# Patient Record
Sex: Female | Born: 1950 | Race: Black or African American | Hispanic: No | Smoking: Former smoker
Health system: Southern US, Community
[De-identification: ages and names within clinical notes are randomized; demographics above are authoritative.]

## PROBLEM LIST (undated history)

## (undated) DIAGNOSIS — C801 Malignant (primary) neoplasm, unspecified: Secondary | ICD-10-CM

## (undated) DIAGNOSIS — I10 Essential (primary) hypertension: Secondary | ICD-10-CM

## (undated) DIAGNOSIS — Z720 Tobacco use: Secondary | ICD-10-CM

## (undated) DIAGNOSIS — C7931 Secondary malignant neoplasm of brain: Secondary | ICD-10-CM

## (undated) DIAGNOSIS — C7900 Secondary malignant neoplasm of unspecified kidney and renal pelvis: Secondary | ICD-10-CM

## (undated) HISTORY — DX: Tobacco use: Z72.0

## (undated) HISTORY — DX: Essential (primary) hypertension: I10

---

## 1999-08-04 ENCOUNTER — Other Ambulatory Visit: Admission: RE | Admit: 1999-08-04 | Discharge: 1999-08-04 | Payer: Self-pay | Admitting: Family Medicine

## 1999-09-06 ENCOUNTER — Encounter: Payer: Self-pay | Admitting: Family Medicine

## 1999-09-06 ENCOUNTER — Ambulatory Visit (HOSPITAL_COMMUNITY): Admission: RE | Admit: 1999-09-06 | Discharge: 1999-09-06 | Payer: Self-pay | Admitting: Family Medicine

## 2000-04-12 ENCOUNTER — Emergency Department (HOSPITAL_COMMUNITY): Admission: EM | Admit: 2000-04-12 | Discharge: 2000-04-12 | Payer: Self-pay | Admitting: Emergency Medicine

## 2000-07-16 ENCOUNTER — Other Ambulatory Visit: Admission: RE | Admit: 2000-07-16 | Discharge: 2000-07-16 | Payer: Self-pay | Admitting: Family Medicine

## 2000-08-07 ENCOUNTER — Encounter: Payer: Self-pay | Admitting: Family Medicine

## 2000-08-07 ENCOUNTER — Ambulatory Visit (HOSPITAL_COMMUNITY): Admission: RE | Admit: 2000-08-07 | Discharge: 2000-08-07 | Payer: Self-pay | Admitting: Family Medicine

## 2001-03-12 ENCOUNTER — Encounter: Admission: RE | Admit: 2001-03-12 | Discharge: 2001-03-12 | Payer: Self-pay

## 2002-03-03 ENCOUNTER — Encounter: Payer: Self-pay | Admitting: Family Medicine

## 2002-03-03 ENCOUNTER — Ambulatory Visit (HOSPITAL_COMMUNITY): Admission: RE | Admit: 2002-03-03 | Discharge: 2002-03-03 | Payer: Self-pay | Admitting: Family Medicine

## 2003-05-13 ENCOUNTER — Emergency Department (HOSPITAL_COMMUNITY): Admission: EM | Admit: 2003-05-13 | Discharge: 2003-05-13 | Payer: Self-pay | Admitting: *Deleted

## 2003-05-14 ENCOUNTER — Other Ambulatory Visit (HOSPITAL_COMMUNITY): Admission: RE | Admit: 2003-05-14 | Discharge: 2003-05-31 | Payer: Self-pay | Admitting: Psychiatry

## 2003-07-19 ENCOUNTER — Encounter: Payer: Self-pay | Admitting: Emergency Medicine

## 2003-07-19 ENCOUNTER — Emergency Department (HOSPITAL_COMMUNITY): Admission: EM | Admit: 2003-07-19 | Discharge: 2003-07-19 | Payer: Self-pay | Admitting: Emergency Medicine

## 2009-01-28 ENCOUNTER — Ambulatory Visit (HOSPITAL_COMMUNITY): Admission: RE | Admit: 2009-01-28 | Discharge: 2009-01-28 | Payer: Self-pay | Admitting: Internal Medicine

## 2011-08-02 ENCOUNTER — Other Ambulatory Visit (HOSPITAL_COMMUNITY): Payer: Self-pay | Admitting: Internal Medicine

## 2011-08-02 DIAGNOSIS — Z1231 Encounter for screening mammogram for malignant neoplasm of breast: Secondary | ICD-10-CM

## 2011-08-15 ENCOUNTER — Ambulatory Visit (HOSPITAL_COMMUNITY)
Admission: RE | Admit: 2011-08-15 | Discharge: 2011-08-15 | Disposition: A | Discharge status: Home / Self Care | Payer: Self-pay | Source: Ambulatory Visit | Attending: Internal Medicine | Admitting: Internal Medicine

## 2011-08-15 DIAGNOSIS — Z1231 Encounter for screening mammogram for malignant neoplasm of breast: Secondary | ICD-10-CM | POA: Insufficient documentation

## 2011-08-17 ENCOUNTER — Other Ambulatory Visit: Payer: Self-pay | Admitting: Internal Medicine

## 2011-08-17 DIAGNOSIS — R928 Other abnormal and inconclusive findings on diagnostic imaging of breast: Secondary | ICD-10-CM

## 2011-09-04 ENCOUNTER — Other Ambulatory Visit: Payer: Self-pay

## 2011-09-06 ENCOUNTER — Ambulatory Visit
Admission: RE | Admit: 2011-09-06 | Discharge: 2011-09-06 | Disposition: A | Discharge status: Home / Self Care | Payer: Private Health Insurance - Indemnity | Source: Ambulatory Visit | Attending: Internal Medicine | Admitting: Internal Medicine

## 2011-09-06 DIAGNOSIS — R928 Other abnormal and inconclusive findings on diagnostic imaging of breast: Secondary | ICD-10-CM

## 2014-03-23 ENCOUNTER — Encounter: Payer: Self-pay | Admitting: Family Medicine

## 2014-03-23 ENCOUNTER — Ambulatory Visit (INDEPENDENT_AMBULATORY_CARE_PROVIDER_SITE_OTHER): Payer: 59 | Admitting: Medical

## 2014-03-23 ENCOUNTER — Encounter: Payer: Self-pay | Admitting: Medical

## 2014-03-23 VITALS — BP 130/80 | HR 88 | Temp 98.2°F | Resp 14 | Wt 133.0 lb

## 2014-03-23 DIAGNOSIS — I10 Essential (primary) hypertension: Secondary | ICD-10-CM

## 2014-03-23 DIAGNOSIS — R51 Headache: Secondary | ICD-10-CM

## 2014-03-23 DIAGNOSIS — F430 Acute stress reaction: Secondary | ICD-10-CM

## 2014-03-23 DIAGNOSIS — F17200 Nicotine dependence, unspecified, uncomplicated: Secondary | ICD-10-CM

## 2014-03-23 NOTE — Patient Instructions (Signed)
  Thank you for giving me the opportunity to serve you today.    Your diagnosis today includes: Encounter Diagnoses  Name Primary?  . Headache(784.0) Yes  . Essential hypertension, benign   . Tobacco use disorder   . Acute stress reaction      Specific recommendations today include:  Blood pressure looks fine today, continue your current medication  You may take Tylenol or Excedrin over-the-counter for the next few days for headache  You may use Benadryl for Zquil over-the-counter for sleep if needed  Try to make peace with your boss and fellow employees to help keep a peaceful work environment  If needed, go to your boss' supervisor to document the inappropriate name calling and concerns  Recheck if needed on this or if headache and symptoms worsen  Otherwise, recheck in 3 months on blood pressure

## 2014-03-23 NOTE — Progress Notes (Signed)
Subjective:   Beth Alexander is a 63 y.o. female presenting on 03/23/2014 with Headache and ITCHING ON HER ARMS  New patient today.  Was seeing [facility] prior, recently got notice from insurer that her current provider of 5 years no longer takes [facility], thus, having to change providers.    She is here today for headache and concern that her blood pressure is elevated.  She has a history of high blood pressure, currently takes amlodipine 5 mg daily.  She normally does not check her blood pressures, normally doesn't have problems the headaches or stress.  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 Works at the [employer].  She says her boss has a split personality, some days can be happy and easy With, other days not so much.  On Sunday her boss is complained about the front office, and she ended up having a conversation with the front office people that got back to her boss.  Her boss called her a "fucking idiot."  This really upset her. Since then she has had a headache, had trouble sleeping, had a rough day at work yesterday because a headache.  She currently reports a frontal headache, rating it 9/10 for pain. She has been concerned that it was caused by high blood pressure since she felt really upset about the incident with her manager.  Denies vision changes, no swelling, no dizziness, no lightheadedness, no numbness or no med.  She does report tingling in her fingers for the last year, but no new tingling weakness or numbness.  She has had no change in her caffeine intake, she does drink 2 cups of coffee a day, smokes a pack a day.  There is no history of brain tumor aneurysm or headache issues with family.  No other aggravating or relieving factors.  No other complaint.  Review of Systems ROS as in subjective      Objective:    Filed Vitals:   03/23/14 1101  BP: 130/80  Pulse: 88  Temp: 98.2 F (36.8 C)  Resp: 14    General appearance:  alert, no distress, WD/WN, AA female, strong tobacco odor HEENT: normocephalic, sclerae anicteric, TMs pearly, nares patent, no discharge or erythema, pharynx normal Oral cavity: MMM, no lesions Neck: supple, no lymphadenopathy, no thyromegaly, no masses, no bruits Heart: RRR, normal S1, S2, no murmurs Lungs: CTA bilaterally, no wheezes, rhonchi, or rales Pulses: 2+ symmetric, upper and lower extremities, normal cap refill Ext: no edema Neuro: CN2-12 intact, A&Ox3, nonfocal exam      Assessment: Encounter Diagnoses  Name Primary?  . Headache(784.0) Yes  . Acute stress reaction   . Essential hypertension, benign   . Tobacco use disorder      Plan: We discussed her concerns, counseled on her stress at work.   Advised she c/t same medication, and specific recommendations today include:  Blood pressure looks fine today, continue your current medication  You may take Tylenol or Excedrin over-the-counter for the next few days for headache  You may use Benadryl for Zquil over-the-counter for sleep if needed  Try to make peace with your boss and fellow employees to help keep a peaceful work environment  If needed, go to your boss' supervisor to document the inappropriate name calling and concerns  Recheck if needed on this or if headache and symptoms worsen  Otherwise, recheck in 3 months on blood pressure  We requested prior PCM records today.  [name] was seen today for headache and itching on her arms.  Diagnoses and associated orders for this visit:  Headache(784.0)  Acute stress reaction  Essential hypertension, benign  Tobacco use disorder     Return in about 3 months (around 06/23/2014).

## 2014-04-07 ENCOUNTER — Telehealth: Payer: Self-pay | Admitting: Family Medicine

## 2014-04-07 NOTE — Telephone Encounter (Signed)
I LMOM ON HER VOICEMAIL. CLS

## 2014-04-07 NOTE — Telephone Encounter (Signed)
I don't believe I looked at her armpits at her last visit.  This sounds like it could be allergic reaction to deodorant.   I either need to see her, or have her stop current deodorant, use OTC Cortaid topically BID, and can use benadryl oral OTC for itching.

## 2014-04-07 NOTE — Telephone Encounter (Signed)
Returned call to pt, she states she can't get itching under control on her arms. She has tried benedryl cream, cortisone cream and calimine.  Pt is req rx to be called into [facility] [address]  Pt saw you 03/23/14 regarding this also. Pt ph [phone]

## 2014-04-07 NOTE — Telephone Encounter (Signed)
LMOM TO CB. CLS 

## 2014-04-09 ENCOUNTER — Other Ambulatory Visit: Payer: Self-pay | Admitting: Medical

## 2014-04-09 ENCOUNTER — Telehealth: Payer: Self-pay | Admitting: Family Medicine

## 2014-04-09 ENCOUNTER — Encounter: Payer: Self-pay | Admitting: Medical

## 2014-04-09 ENCOUNTER — Ambulatory Visit (INDEPENDENT_AMBULATORY_CARE_PROVIDER_SITE_OTHER): Payer: 59 | Admitting: Medical

## 2014-04-09 VITALS — BP 130/80 | HR 82 | Temp 98.6°F | Resp 16 | Wt 135.0 lb

## 2014-04-09 DIAGNOSIS — L259 Unspecified contact dermatitis, unspecified cause: Secondary | ICD-10-CM

## 2014-04-09 MED ORDER — TRIAMCINOLONE ACETONIDE 0.1 % EX CREA: 1.0000 "application " | TOPICAL_CREAM | Freq: Two times a day (BID) | CUTANEOUS | Status: DC

## 2014-04-09 MED ORDER — CLOBETASOL PROPIONATE 0.05 % EX CREA: 1.0000 "application " | TOPICAL_CREAM | Freq: Two times a day (BID) | CUTANEOUS | Status: DC

## 2014-04-09 NOTE — Telephone Encounter (Signed)
Dorothea Ogle PAC called out another Rx to the pharmacy. CLS

## 2014-04-09 NOTE — Patient Instructions (Signed)

## 2014-04-09 NOTE — Progress Notes (Signed)
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 Rash started 1 month ago. Lesions are red, irritated, and papular in texture. Rash has progressively worsened. Rash is pruritic and tingles, feels pins and needles. Associated symptoms: none. Patient denies: abdominal pain, arthralgia, cough, decrease in appetite, decrease in energy level, fever, headache, nausea, sore throat and vomiting. Patient has not had contacts with similar rash. Patient has not had new exposures - no new soaps, cleaning agents (works in housekeeping).  she normally doesn't use gloves at work, even when cleaning toilets.  Has not been working in the yard or come into contact with poison ivy.  Of note, she has been out in the sun a lot in the past month, taking the bus to work, admitting that her arms are exposed and she regularly sweats over the area causing a stinging like sensation. She has tried Calamine lotion, Benadryl lotion, some hydrocortisone cream without any relief. No other aggravating or relieving factors.  The following portions of the patient's history were reviewed and updated as appropriate: allergies, current medications, past family history, past medical history, past social history and problem list.  Review of Systems As in subjective above   Objective:   Gen: wd, wn, nad Skin: Skin at antecubital regions bilat, somewhat on forearms also with dry, rough, slightly raised broadly, slightly erythematous and brown skin, well demarcated, few small 82mm papules between fingers of both hands with slight erythema.  No warmth, induration, fluctuance.  No discharge.   Assessment:      Encounter Diagnosis  Name Primary?  . Contact dermatitis Yes     Plan:   Discussed symptoms and exam findings.  Etiology most likely contact dermatitis.  Begin Clobetasol cream topically short term x up to 1 week.  Use good  hygiene.  Use gloves at work [employer] cabin [employer], chemicals, etc.   Can use Benadryl oral the next few days.   Call if not resolved within a week.

## 2014-04-09 NOTE — Telephone Encounter (Signed)
pls call pharmacy to get cheaper options from pharmacist, but similar steroid cream strength.

## 2014-04-09 NOTE — Telephone Encounter (Signed)
Patient called and said the Rx cream is costing her $100.00 dollars is there anything else that is cheaper?

## 2014-04-13 ENCOUNTER — Telehealth: Payer: Self-pay | Admitting: Medical

## 2014-04-13 NOTE — Telephone Encounter (Signed)
Pt called and stated that the medication she was given for itch is going to cost her $100. She can not afford that. She is requesting something less expensive be called in she would like it sent to a DIFFERENT PHARMACY. New pharmacy is [facility] [address]. Please call pt with update. Pt can be reached at [phone]

## 2014-04-13 NOTE — Telephone Encounter (Signed)
pls call and inquire.  Triamcinolone cream shouldn't cost very much.  What is the pharmacist option instead?

## 2014-04-15 ENCOUNTER — Telehealth: Payer: Self-pay | Admitting: Medical

## 2014-04-15 NOTE — Telephone Encounter (Signed)
Pt called back on the 23rd and stated that the medication she was prescribed was too expensive. She states she has called back a few times after that and still hasn't heard anything. Please call back pt and let her know what is going on.

## 2014-04-15 NOTE — Telephone Encounter (Signed)
Called [facility] and they have the Triamcinolone Cream for $4.06 called pt home and cell left message.  This is much cheaper

## 2014-04-19 NOTE — Telephone Encounter (Signed)
Pt called in and spoke with Mickel Baas and was advised of cheaper cream

## 2014-04-26 ENCOUNTER — Encounter: Payer: Self-pay | Admitting: Medical

## 2014-06-30 ENCOUNTER — Other Ambulatory Visit: Payer: Self-pay | Admitting: Family Medicine

## 2014-06-30 ENCOUNTER — Telehealth: Payer: Self-pay | Admitting: Medical

## 2014-06-30 MED ORDER — AMLODIPINE BESYLATE 5 MG PO TABS: 5.0000 mg | ORAL_TABLET | Freq: Every day | ORAL | Status: DC

## 2014-06-30 NOTE — Telephone Encounter (Signed)
Rx refills sent on BP medication

## 2016-07-09 ENCOUNTER — Ambulatory Visit: Payer: Self-pay | Admitting: Allergy and Immunology

## 2016-08-07 ENCOUNTER — Other Ambulatory Visit: Payer: Self-pay | Admitting: Internal Medicine

## 2016-08-07 DIAGNOSIS — Z1231 Encounter for screening mammogram for malignant neoplasm of breast: Secondary | ICD-10-CM

## 2016-08-07 DIAGNOSIS — E2839 Other primary ovarian failure: Secondary | ICD-10-CM

## 2016-08-15 ENCOUNTER — Ambulatory Visit
Admission: RE | Admit: 2016-08-15 | Discharge: 2016-08-15 | Disposition: A | Discharge status: Home / Self Care | Payer: BLUE CROSS/BLUE SHIELD | Source: Ambulatory Visit | Attending: Internal Medicine | Admitting: Internal Medicine

## 2016-08-15 DIAGNOSIS — Z1231 Encounter for screening mammogram for malignant neoplasm of breast: Secondary | ICD-10-CM

## 2016-08-17 ENCOUNTER — Other Ambulatory Visit: Payer: Self-pay | Admitting: Internal Medicine

## 2016-08-17 DIAGNOSIS — R928 Other abnormal and inconclusive findings on diagnostic imaging of breast: Secondary | ICD-10-CM

## 2016-08-29 ENCOUNTER — Other Ambulatory Visit: Payer: Self-pay | Admitting: Internal Medicine

## 2016-08-29 ENCOUNTER — Ambulatory Visit
Admission: RE | Admit: 2016-08-29 | Discharge: 2016-08-29 | Disposition: A | Discharge status: Home / Self Care | Payer: BLUE CROSS/BLUE SHIELD | Source: Ambulatory Visit | Attending: Internal Medicine | Admitting: Internal Medicine

## 2016-08-29 DIAGNOSIS — R928 Other abnormal and inconclusive findings on diagnostic imaging of breast: Secondary | ICD-10-CM

## 2016-08-29 DIAGNOSIS — R921 Mammographic calcification found on diagnostic imaging of breast: Secondary | ICD-10-CM

## 2016-08-29 DIAGNOSIS — E2839 Other primary ovarian failure: Secondary | ICD-10-CM

## 2016-09-04 ENCOUNTER — Ambulatory Visit
Admission: RE | Admit: 2016-09-04 | Discharge: 2016-09-04 | Disposition: A | Discharge status: Home / Self Care | Payer: BLUE CROSS/BLUE SHIELD | Source: Ambulatory Visit | Attending: Internal Medicine | Admitting: Internal Medicine

## 2016-09-04 DIAGNOSIS — R921 Mammographic calcification found on diagnostic imaging of breast: Secondary | ICD-10-CM

## 2016-09-04 DIAGNOSIS — R928 Other abnormal and inconclusive findings on diagnostic imaging of breast: Secondary | ICD-10-CM

## 2016-09-17 ENCOUNTER — Ambulatory Visit: Payer: Self-pay | Admitting: Surgery

## 2016-09-17 DIAGNOSIS — N6092 Unspecified benign mammary dysplasia of left breast: Secondary | ICD-10-CM

## 2016-09-17 NOTE — H&P (Signed)
History of Present Illness Beth Alexander. Beth Tadros MD; 09/17/2016 12:01 PM) Patient words: ADH.  The patient is a 65 year old female who presents with a complaint of Breast problems. Referred by Dr. Jeanie Alexander for atypical ductal hyperplasia left breast  This is a 65 year old female who recently underwent routine screening mammogram. Her last mammogram was about 4 years prior. This showed some suspicious findings in the lower outer quadrant of her left breast. She underwent further workup including diagnostic mammogram which showed some pleomorphic calcifications in the lower outer quadrant left breast banding an area 4.7 cm. There is no suspicious mass. She underwent a left stereotactic core needle biopsy Alexander 09/04/14. A clip was placed at the time of biopsy. 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 She presents now for excision.  Menarche age 71 First pregnancy age 56 Breast-feed none Hormones none Menopause age 52 Family history had breast cancer at age 32  CLINICAL DATA: Screening.  EXAM: DIGITAL SCREENING BILATERAL MAMMOGRAM WITH CAD  COMPARISON: Previous exam(s).  ACR Breast Density Category b: There are scattered areas of fibroglandular density.  FINDINGS: In the left breast, calcifications warrant further evaluation. In the right breast, no findings suspicious for malignancy. Images were processed with CAD.  IMPRESSION: Further evaluation is suggested for calcifications in the left breast.  RECOMMENDATION: Diagnostic mammogram of the left breast. (Code:[id number])  The patient will be contacted regarding the findings, and additional imaging will be scheduled.  BI-RADS CATEGORY 0: Incomplete. Need additional imaging evaluation and/or prior mammograms for comparison.   Electronically Signed By: Beth Alexander M.D. Alexander: 08/16/2016 11:41  CLINICAL DATA: Patient was called back from screening mammogram for left breast  calcifications.  EXAM: DIGITAL DIAGNOSTIC LEFT MAMMOGRAM WITH CAD  COMPARISON: Previous exam(s).  ACR Breast Density Category b: There are scattered areas of fibroglandular density.  FINDINGS: Additional imaging of the left breast was performed. In the lower outer quadrant are calcifications spanning an area of 4.7 cm. They are pleomorphic and in a segmental distribution. They are concerning. There is no suspicious mass.  Mammographic images were processed with CAD.  IMPRESSION: Suspicious left breast calcifications.  RECOMMENDATION: Stereotactic biopsy of the left breast calcifications is recommended. The biopsy will be scheduled at the patient's convenience.  I have discussed the findings and recommendations with the patient. Results were also provided in writing at the conclusion of the visit. If applicable, a reminder letter will be sent to the patient regarding the next appointment.  BI-RADS CATEGORY 5: Highly suggestive of malignancy.   Electronically Signed By: Beth Alexander: 08/29/2016 14:05  CLINICAL DATA: Patient with left breast calcifications presents today for stereotactic biopsy.  EXAM: LEFT BREAST STEREOTACTIC CORE NEEDLE BIOPSY  COMPARISON: Previous exams.  FINDINGS: The patient and I discussed the procedure of stereotactic-guided biopsy including benefits and alternatives. We discussed the high likelihood of a successful procedure. We discussed the risks of the procedure including infection, bleeding, tissue injury, clip migration, and inadequate sampling. Informed written consent was given. The usual time out protocol was performed immediately prior to the procedure.  Using sterile technique and 1% Lidocaine as local anesthetic, under stereotactic guidance, a 9 gauge vacuum assisted device was used to perform core needle biopsy of calcifications in the lower outer quadrant of the left breast using a lateral approach.  Specimen radiograph was performed showing calcifications. Specimens with calcifications are identified for pathology.  At the conclusion of the procedure, a coil shaped tissue marker clip was deployed into the biopsy cavity. Follow-up  2-view mammogram was performed and dictated separately.  IMPRESSION: Stereotactic-guided biopsy of left breast calcifications. No apparent complications.  Electronically Signed: By: Beth Alexander M.D. Alexander: 09/04/2016 11:20  ADDENDUM: Pathology revealed fibrocystic changes with atypical ductal hyperplasia and calcifications in the left breast. This was found to be concordant by Dr. Franki Alexander. Pathology results were discussed with the patient by telephone. The patient reported doing well after the biopsy with tenderness at the site. Post biopsy instructions and care were reviewed and questions were answered. The patient was encouraged to call [facility] for any additional concerns. Surgical consultation has been arranged with Dr. Donnie Alexander at [facility] September 17, 2016.  There are similar appearing left breast calcifications, both anterior and posterior to the biopsy site spanning 4.7 cm, and would consider bracketing of the calcifications at the time of surgery.  Pathology results reported by Beth Alexander Alexander 09/05/2016.   Electronically Signed By: Beth Alexander M.D. Alexander: 09/05/2016 10:20   Other Problems (Beth Eversole, LPN; 85/46/2703 50:09 AM) High blood pressure  Past Surgical History (Beth Eversole, LPN; 38/18/2993 71:69 AM) Breast Biopsy Left.  Diagnostic Studies History (Beth Eversole, LPN; 67/89/3810 17:51 AM) Colonoscopy never Mammogram within last year Pap Smear 1-5 years ago  Allergies (Beth Eversole, LPN; 02/58/5277 82:42 AM) No Known Drug Allergies 09/17/2016  Medication History (Beth Eversole, LPN; 35/36/1443 15:40 AM) Aspirin ('325MG'$   Tablet, Oral) Active. Norvasc ('5MG'$  Tablet, Oral) Active. Medications Reconciled  Social History (Beth Eversole, LPN; 08/67/6195 09:32 AM) Alcohol use Recently quit alcohol use. Caffeine use Coffee. No drug use Tobacco use Current some day smoker.  Family History (Beth Borer, LPN; 67/09/4579 99:83 AM) Breast Cancer Mother. Colon Cancer Brother. Heart Disease Father.  Pregnancy / Birth History Beth Borer, LPN; 38/25/0539 76:73 AM) Age at menarche 10 years. Age of menopause <46 Contraceptive History Oral contraceptives. Gravida 2 Maternal age 68-20 Para 2 Regular periods     Review of Systems (Beth Eversole LPN; 41/93/7902 40:97 AM) General Not Present- Appetite Loss, Chills, Fatigue, Fever, Night Sweats, Weight Gain and Weight Loss. HEENT Not Present- Earache, Hearing Loss, Hoarseness, Nose Bleed, Oral Ulcers, Ringing in the Ears, Seasonal Allergies, Sinus Pain, Sore Throat, Visual Disturbances, Wears glasses/contact lenses and Yellow Eyes. Respiratory Present- Snoring. Not Present- Bloody sputum, Chronic Cough, Difficulty Breathing and Wheezing. Breast Not Present- Breast Mass, Breast Pain, Nipple Discharge and Skin Changes. Cardiovascular Not Present- Chest Pain, Difficulty Breathing Lying Down, Leg Cramps, Palpitations, Rapid Heart Rate, Shortness of Breath and Swelling of Extremities. Gastrointestinal Not Present- Abdominal Pain, Bloating, Bloody Stool, Change in Bowel Habits, Chronic diarrhea, Constipation, Difficulty Swallowing, Excessive gas, Gets full quickly at meals, Hemorrhoids, Indigestion, Nausea, Rectal Pain and Vomiting. Female Genitourinary Not Present- Frequency, Nocturia, Painful Urination, Pelvic Pain and Urgency. Musculoskeletal Not Present- Back Pain, Joint Pain, Joint Stiffness, Muscle Pain, Muscle Weakness and Swelling of Extremities. Neurological Not Present- Decreased Memory, Fainting, Headaches, Numbness, Seizures, Tingling,  Tremor, Trouble walking and Weakness. Psychiatric Not Present- Anxiety, Bipolar, Change in Sleep Pattern, Depression, Fearful and Frequent crying. Endocrine Not Present- Cold Intolerance, Excessive Hunger, Hair Changes, Heat Intolerance, Hot flashes and New Diabetes. Hematology Not Present- Blood Thinners, Easy Bruising, Excessive bleeding, Gland problems, HIV and Persistent Infections.  Vitals (Beth Eversole LPN; 35/32/9924 26:83 AM) 09/17/2016 11:01 AM Weight: 146.8 lb Height: 62in Body Surface Area: 1.68 m Body Mass Index: 26.85 kg/m  Temp.: 98.33F(Oral)  Pulse: 84 (Regular)  BP: 130/70 (Sitting, Left Arm, Standard)  Physical Exam Rodman Key K. Tekia Waterbury MD; 09/17/2016 12:02 PM)  The physical exam findings are as follows: Note:WDWN in NAD Eyes: Pupils equal, round; sclera anicteric HENT: Oral mucosa moist; good dentition Neck: No masses palpated, no thyromegaly Breasts: symmetric; no nipple retraction or discharge. Some slight fibrocystic changes bilateral lateral breasts. No dominant palpable masses Lungs: CTA bilaterally; normal respiratory effort CV: Regular rate and rhythm; no murmurs; extremities well-perfused with no edema Abd: +bowel sounds, soft, non-tender, no palpable organomegaly; no palpable hernias Skin: Warm, dry; no sign of jaundice Psychiatric - alert and oriented x 4; calm mood and affect    Assessment & Plan Rodman Key K. Jaydeen Darley MD; 09/17/2016 11:24 AM)  ATYPICAL DUCTAL HYPERPLASIA OF LEFT BREAST (N60.92)  Current Plans Schedule for Surgery - Left radioactive seed localized lumpectomy. The surgical procedure has been discussed with the patient. Potential risks, benefits, alternative treatments, and expected outcomes have been explained. All of the patient's questions at this time have been answered. The likelihood of reaching the patient's treatment goal is good. The patient understand the proposed surgical procedure and wishes to  proceed.  Beth Alexander. Georgette Dover, MD, [facility]  General/ Trauma Surgery  09/17/2016 12:02 PM

## 2016-10-26 ENCOUNTER — Other Ambulatory Visit: Payer: Self-pay | Admitting: Surgery

## 2016-10-26 DIAGNOSIS — N6092 Unspecified benign mammary dysplasia of left breast: Secondary | ICD-10-CM

## 2016-11-01 ENCOUNTER — Encounter (HOSPITAL_BASED_OUTPATIENT_CLINIC_OR_DEPARTMENT_OTHER): Payer: Self-pay | Admitting: *Deleted

## 2016-11-07 ENCOUNTER — Other Ambulatory Visit: Payer: Self-pay | Admitting: Surgery

## 2016-11-07 ENCOUNTER — Ambulatory Visit
Admission: RE | Admit: 2016-11-07 | Discharge: 2016-11-07 | Disposition: A | Discharge status: Home / Self Care | Payer: BLUE CROSS/BLUE SHIELD | Source: Ambulatory Visit | Attending: Surgery | Admitting: Surgery

## 2016-11-07 ENCOUNTER — Encounter (HOSPITAL_BASED_OUTPATIENT_CLINIC_OR_DEPARTMENT_OTHER)
Admission: RE | Admit: 2016-11-07 | Discharge: 2016-11-07 | Disposition: A | Discharge status: Home / Self Care | Payer: BLUE CROSS/BLUE SHIELD | Source: Ambulatory Visit | Attending: Surgery | Admitting: Surgery

## 2016-11-07 DIAGNOSIS — Z79899 Other long term (current) drug therapy: Secondary | ICD-10-CM | POA: Diagnosis not present

## 2016-11-07 DIAGNOSIS — I10 Essential (primary) hypertension: Secondary | ICD-10-CM | POA: Diagnosis not present

## 2016-11-07 DIAGNOSIS — N6092 Unspecified benign mammary dysplasia of left breast: Secondary | ICD-10-CM

## 2016-11-07 DIAGNOSIS — Z7982 Long term (current) use of aspirin: Secondary | ICD-10-CM | POA: Diagnosis not present

## 2016-11-07 DIAGNOSIS — D0502 Lobular carcinoma in situ of left breast: Secondary | ICD-10-CM | POA: Diagnosis not present

## 2016-11-07 DIAGNOSIS — F17200 Nicotine dependence, unspecified, uncomplicated: Secondary | ICD-10-CM | POA: Diagnosis not present

## 2016-11-07 NOTE — Progress Notes (Signed)
Pt instructed to drink Boost by 0530 day of surgery with teach back method. Pt is planning to spend the night because she does not have any one to stay with her and go home by [facility] Friday am.

## 2016-11-08 ENCOUNTER — Encounter (HOSPITAL_BASED_OUTPATIENT_CLINIC_OR_DEPARTMENT_OTHER): Payer: Self-pay | Admitting: Anesthesiology

## 2016-11-08 ENCOUNTER — Ambulatory Visit (HOSPITAL_BASED_OUTPATIENT_CLINIC_OR_DEPARTMENT_OTHER): Payer: BLUE CROSS/BLUE SHIELD | Admitting: Anesthesiology

## 2016-11-08 ENCOUNTER — Encounter (HOSPITAL_BASED_OUTPATIENT_CLINIC_OR_DEPARTMENT_OTHER)
Admission: RE | Disposition: A | Discharge status: Home / Self Care | Payer: Self-pay | Source: Ambulatory Visit | Attending: Surgery

## 2016-11-08 ENCOUNTER — Ambulatory Visit (HOSPITAL_BASED_OUTPATIENT_CLINIC_OR_DEPARTMENT_OTHER)
Admission: RE | Admit: 2016-11-08 | Discharge: 2016-11-08 | Disposition: A | Discharge status: Home / Self Care | Payer: BLUE CROSS/BLUE SHIELD | Source: Ambulatory Visit | Attending: Surgery | Admitting: Surgery

## 2016-11-08 ENCOUNTER — Ambulatory Visit
Admission: RE | Admit: 2016-11-08 | Discharge: 2016-11-08 | Disposition: A | Discharge status: Home / Self Care | Payer: BLUE CROSS/BLUE SHIELD | Source: Ambulatory Visit | Attending: Surgery | Admitting: Surgery

## 2016-11-08 DIAGNOSIS — N6092 Unspecified benign mammary dysplasia of left breast: Secondary | ICD-10-CM

## 2016-11-08 DIAGNOSIS — I10 Essential (primary) hypertension: Secondary | ICD-10-CM | POA: Insufficient documentation

## 2016-11-08 DIAGNOSIS — D0502 Lobular carcinoma in situ of left breast: Secondary | ICD-10-CM | POA: Insufficient documentation

## 2016-11-08 DIAGNOSIS — F17200 Nicotine dependence, unspecified, uncomplicated: Secondary | ICD-10-CM | POA: Insufficient documentation

## 2016-11-08 DIAGNOSIS — Z79899 Other long term (current) drug therapy: Secondary | ICD-10-CM | POA: Insufficient documentation

## 2016-11-08 DIAGNOSIS — Z7982 Long term (current) use of aspirin: Secondary | ICD-10-CM | POA: Insufficient documentation

## 2016-11-08 HISTORY — PX: BREAST LUMPECTOMY WITH RADIOACTIVE SEED LOCALIZATION: SHX6424

## 2016-11-08 SURGERY — BREAST LUMPECTOMY WITH RADIOACTIVE SEED LOCALIZATION
Anesthesia: General | Site: Breast | Laterality: Left

## 2016-11-08 MED ORDER — FENTANYL CITRATE (PF) 100 MCG/2ML IJ SOLN: 50.0000 ug | INTRAMUSCULAR | Status: DC | PRN

## 2016-11-08 MED ORDER — HYDROMORPHONE HCL 1 MG/ML IJ SOLN: 0.2500 mg | INTRAMUSCULAR | Status: DC | PRN

## 2016-11-08 MED ORDER — HEPARIN (PORCINE) IN NACL 2-0.9 UNIT/ML-% IJ SOLN
INTRAMUSCULAR | Status: AC
  Filled 2016-11-08: qty 500

## 2016-11-08 MED ORDER — BUPIVACAINE HCL (PF) 0.25 % IJ SOLN
INTRAMUSCULAR | Status: AC
  Filled 2016-11-08: qty 60

## 2016-11-08 MED ORDER — FENTANYL CITRATE (PF) 100 MCG/2ML IJ SOLN
INTRAMUSCULAR | Status: AC
  Filled 2016-11-08: qty 2

## 2016-11-08 MED ORDER — MIDAZOLAM HCL 2 MG/2ML IJ SOLN
INTRAMUSCULAR | Status: AC
  Filled 2016-11-08: qty 2

## 2016-11-08 MED ORDER — DEXAMETHASONE SODIUM PHOSPHATE 4 MG/ML IJ SOLN
INTRAMUSCULAR | Status: DC | PRN
  Administered 2016-11-08: 10 mg via INTRAVENOUS

## 2016-11-08 MED ORDER — HYDROCODONE-ACETAMINOPHEN 5-325 MG PO TABS: 1.0000 | ORAL_TABLET | Freq: Four times a day (QID) | ORAL | 0 refills | Status: DC | PRN

## 2016-11-08 MED ORDER — PROPOFOL 10 MG/ML IV BOLUS
INTRAVENOUS | Status: DC | PRN
  Administered 2016-11-08: 150 mg via INTRAVENOUS
  Administered 2016-11-08 (×2): 50 mg via INTRAVENOUS

## 2016-11-08 MED ORDER — CHLORHEXIDINE GLUCONATE CLOTH 2 % EX PADS: 6.0000 | MEDICATED_PAD | Freq: Once | CUTANEOUS | Status: DC

## 2016-11-08 MED ORDER — FENTANYL CITRATE (PF) 100 MCG/2ML IJ SOLN
INTRAMUSCULAR | Status: DC | PRN
  Administered 2016-11-08: 50 ug via INTRAVENOUS
  Administered 2016-11-08: 25 ug via INTRAVENOUS

## 2016-11-08 MED ORDER — ONDANSETRON HCL 4 MG/2ML IJ SOLN
INTRAMUSCULAR | Status: DC | PRN
  Administered 2016-11-08: 4 mg via INTRAVENOUS

## 2016-11-08 MED ORDER — PHENYLEPHRINE 40 MCG/ML (10ML) SYRINGE FOR IV PUSH (FOR BLOOD PRESSURE SUPPORT)
PREFILLED_SYRINGE | INTRAVENOUS | Status: AC
  Filled 2016-11-08: qty 10

## 2016-11-08 MED ORDER — LIDOCAINE 2% (20 MG/ML) 5 ML SYRINGE
INTRAMUSCULAR | Status: AC
  Filled 2016-11-08: qty 5

## 2016-11-08 MED ORDER — LIDOCAINE HCL (CARDIAC) 20 MG/ML IV SOLN
INTRAVENOUS | Status: DC | PRN
  Administered 2016-11-08: 30 mg via INTRAVENOUS

## 2016-11-08 MED ORDER — KETOROLAC TROMETHAMINE 30 MG/ML IJ SOLN: 30.0000 mg | Freq: Once | INTRAMUSCULAR | Status: DC | PRN

## 2016-11-08 MED ORDER — HEPARIN SOD (PORK) LOCK FLUSH 100 UNIT/ML IV SOLN
INTRAVENOUS | Status: AC
  Filled 2016-11-08: qty 5

## 2016-11-08 MED ORDER — DEXAMETHASONE SODIUM PHOSPHATE 10 MG/ML IJ SOLN
INTRAMUSCULAR | Status: AC
  Filled 2016-11-08: qty 1

## 2016-11-08 MED ORDER — MIDAZOLAM HCL 5 MG/5ML IJ SOLN
INTRAMUSCULAR | Status: DC | PRN
  Administered 2016-11-08: 2 mg via INTRAVENOUS

## 2016-11-08 MED ORDER — BUPIVACAINE-EPINEPHRINE (PF) 0.5% -1:200000 IJ SOLN
INTRAMUSCULAR | Status: AC
  Filled 2016-11-08: qty 60

## 2016-11-08 MED ORDER — BUPIVACAINE-EPINEPHRINE (PF) 0.5% -1:200000 IJ SOLN
INTRAMUSCULAR | Status: DC | PRN
  Administered 2016-11-08: 10 mL

## 2016-11-08 MED ORDER — CEFAZOLIN SODIUM-DEXTROSE 2-4 GM/100ML-% IV SOLN
2.0000 g | INTRAVENOUS | Status: AC
  Administered 2016-11-08: 2 g via INTRAVENOUS

## 2016-11-08 MED ORDER — ONDANSETRON HCL 4 MG/2ML IJ SOLN
INTRAMUSCULAR | Status: AC
  Filled 2016-11-08: qty 2

## 2016-11-08 MED ORDER — PROPOFOL 10 MG/ML IV BOLUS
INTRAVENOUS | Status: AC
  Filled 2016-11-08: qty 20

## 2016-11-08 MED ORDER — LACTATED RINGERS IV SOLN
INTRAVENOUS | Status: DC | PRN
  Administered 2016-11-08 (×2): via INTRAVENOUS

## 2016-11-08 MED ORDER — LIDOCAINE HCL (PF) 1 % IJ SOLN
INTRAMUSCULAR | Status: AC
  Filled 2016-11-08: qty 30

## 2016-11-08 MED ORDER — PHENYLEPHRINE HCL 10 MG/ML IJ SOLN
INTRAMUSCULAR | Status: DC | PRN
  Administered 2016-11-08 (×2): 80 ug via INTRAVENOUS

## 2016-11-08 MED ORDER — CEFAZOLIN SODIUM-DEXTROSE 2-4 GM/100ML-% IV SOLN
INTRAVENOUS | Status: AC
  Filled 2016-11-08: qty 100

## 2016-11-08 MED ORDER — MIDAZOLAM HCL 2 MG/2ML IJ SOLN: 1.0000 mg | INTRAMUSCULAR | Status: DC | PRN

## 2016-11-08 MED ORDER — LACTATED RINGERS IV SOLN
INTRAVENOUS | Status: DC
  Administered 2016-11-08: 10 mL/h via INTRAVENOUS

## 2016-11-08 MED ORDER — PROMETHAZINE HCL 25 MG/ML IJ SOLN: 6.2500 mg | INTRAMUSCULAR | Status: DC | PRN

## 2016-11-08 MED ORDER — SCOPOLAMINE 1 MG/3DAYS TD PT72: 1.0000 | MEDICATED_PATCH | Freq: Once | TRANSDERMAL | Status: DC | PRN

## 2016-11-08 SURGICAL SUPPLY — 53 items
APPLIER CLIP 9.375 MED OPEN (MISCELLANEOUS) ×3
BENZOIN TINCTURE PRP APPL 2/3 (GAUZE/BANDAGES/DRESSINGS) ×3 IMPLANT
BLADE HEX COATED 2.75 (ELECTRODE) ×3 IMPLANT
BLADE SURG 15 STRL LF DISP TIS (BLADE) ×1 IMPLANT
BLADE SURG 15 STRL SS (BLADE) ×2
CANISTER SUC SOCK COL 7IN (MISCELLANEOUS) IMPLANT
CANISTER SUCT 1200ML W/VALVE (MISCELLANEOUS) ×3 IMPLANT
CHLORAPREP W/TINT 26ML (MISCELLANEOUS) ×3 IMPLANT
CLIP APPLIE 9.375 MED OPEN (MISCELLANEOUS) ×1 IMPLANT
CLOSURE WOUND 1/2 X4 (GAUZE/BANDAGES/DRESSINGS) ×1
COVER BACK TABLE 60X90IN (DRAPES) ×3 IMPLANT
COVER MAYO STAND STRL (DRAPES) ×3 IMPLANT
COVER PROBE W GEL 5X96 (DRAPES) ×3 IMPLANT
DECANTER SPIKE VIAL GLASS SM (MISCELLANEOUS) IMPLANT
DEVICE DUBIN W/COMP PLATE 8390 (MISCELLANEOUS) ×3 IMPLANT
DRAPE LAPAROTOMY 100X72 PEDS (DRAPES) ×3 IMPLANT
DRAPE UTILITY XL STRL (DRAPES) ×3 IMPLANT
DRSG TEGADERM 4X4.75 (GAUZE/BANDAGES/DRESSINGS) ×3 IMPLANT
ELECT REM PT RETURN 9FT ADLT (ELECTROSURGICAL) ×3
ELECTRODE REM PT RTRN 9FT ADLT (ELECTROSURGICAL) ×1 IMPLANT
GLOVE BIO SURGEON STRL SZ7 (GLOVE) ×3 IMPLANT
GLOVE BIOGEL M STRL SZ7.5 (GLOVE) ×3 IMPLANT
GLOVE BIOGEL PI IND STRL 7.5 (GLOVE) ×1 IMPLANT
GLOVE BIOGEL PI IND STRL 8 (GLOVE) ×1 IMPLANT
GLOVE BIOGEL PI INDICATOR 7.5 (GLOVE) ×2
GLOVE BIOGEL PI INDICATOR 8 (GLOVE) ×2
GOWN STRL REUS W/ TWL LRG LVL3 (GOWN DISPOSABLE) ×2 IMPLANT
GOWN STRL REUS W/TWL LRG LVL3 (GOWN DISPOSABLE) ×4
ILLUMINATOR WAVEGUIDE N/F (MISCELLANEOUS) IMPLANT
KIT MARKER MARGIN INK (KITS) ×3 IMPLANT
LIGHT WAVEGUIDE WIDE FLAT (MISCELLANEOUS) IMPLANT
NEEDLE HYPO 25X1 1.5 SAFETY (NEEDLE) ×3 IMPLANT
NS IRRIG 1000ML POUR BTL (IV SOLUTION) ×3 IMPLANT
PACK BASIN DAY SURGERY FS (CUSTOM PROCEDURE TRAY) ×3 IMPLANT
PENCIL BUTTON HOLSTER BLD 10FT (ELECTRODE) ×3 IMPLANT
SLEEVE SCD COMPRESS KNEE MED (MISCELLANEOUS) ×3 IMPLANT
SPONGE GAUZE 2X2 8PLY STER LF (GAUZE/BANDAGES/DRESSINGS)
SPONGE GAUZE 2X2 8PLY STRL LF (GAUZE/BANDAGES/DRESSINGS) IMPLANT
SPONGE GAUZE 4X4 12PLY STER LF (GAUZE/BANDAGES/DRESSINGS) ×3 IMPLANT
SPONGE LAP 18X18 X RAY DECT (DISPOSABLE) IMPLANT
SPONGE LAP 4X18 X RAY DECT (DISPOSABLE) ×3 IMPLANT
STRIP CLOSURE SKIN 1/2X4 (GAUZE/BANDAGES/DRESSINGS) ×2 IMPLANT
SUT MON AB 4-0 PC3 18 (SUTURE) ×3 IMPLANT
SUT SILK 2 0 SH (SUTURE) IMPLANT
SUT VIC AB 3-0 SH 27 (SUTURE) ×2
SUT VIC AB 3-0 SH 27X BRD (SUTURE) ×1 IMPLANT
SYR BULB 3OZ (MISCELLANEOUS) ×3 IMPLANT
SYR CONTROL 10ML LL (SYRINGE) ×3 IMPLANT
TOWEL OR 17X24 6PK STRL BLUE (TOWEL DISPOSABLE) ×3 IMPLANT
TOWEL OR NON WOVEN STRL DISP B (DISPOSABLE) ×3 IMPLANT
TUBE CONNECTING 20'X1/4 (TUBING) ×1
TUBE CONNECTING 20X1/4 (TUBING) ×2 IMPLANT
YANKAUER SUCT BULB TIP NO VENT (SUCTIONS) ×3 IMPLANT

## 2016-11-08 NOTE — Op Note (Signed)
Preop diagnosis: Atypical ductal hyperplasia left breast Postop diagnosis: Same Procedure performed: Left radioactive seed localized lumpectomy Surgeon:Valentina Alcoser K. Anesthesia: Gen. via LMA Indications:  This is a 66 year old female who presents after a recent mammogram showing some pleomorphic calcifications in the lower outer quadrant of her left breast. This covers an area measuring about 4.7 cm. Biopsy showed atypical ductal hyperplasia with microcalcifications. The biopsy clip placed at the time of biopsy was just lateral to the microcalcifications. She underwent radioactive seed localization yesterday. A total of 3 seeds were placed to bracket around the microcalcifications.  She presents now for lumpectomy.  Description of procedure: The patient is brought to the operating room and placed in a supine position on the operating room table. After an adequate level general anesthesia was obtained, her left breast was prepped with ChloraPrep and draped in sterile fashion. A timeout was taken to ensure the proper patient and proper procedure. We interrogated the left breast with the neoprobe. 3 distinct areas of activity were noted in the lower outer quadrant. We anesthetized the area around the nipple with 0.5% Marcaine with epinephrine. A curvilinear incision was made around the edge of the areole. We dissected down into the breast tissue with cautery. Using the neoprobe for guidance, we went around all of the seeds to raise margins down to the underlying muscle. The specimen was then removed entirely. There is no residual radioactivity in the biopsy cavity. The specimen was oriented with a paint kit. Specimen mammogram showed all 3 seeds within the specimen. The biopsy clip is not seen within the specimen. However the radiologist feels that the biopsy clip was lateral to the site of the biopsy. I could not palpate the biopsy clip in the wound. We irrigated thoroughly and inspected for hemostasis. The  wound was closed with a deep layer of 3-0 Vicryl and a subcuticular layer of 4-0 Monocryl. Benzoin Steri-Strips were applied. A dry dressing was applied. Patient was then extubated and brought to the recovery room in stable condition. All sponge, instrument, and needle counts are correct.  Beth Alexander. Georgette Dover, MD, [facility] Surgery  General/ Trauma Surgery  11/08/2016 1:52 PM

## 2016-11-08 NOTE — Anesthesia Preprocedure Evaluation (Signed)
Anesthesia Evaluation  Patient identified by MRN, date of birth, ID band Patient awake    Reviewed: Allergy & Precautions, NPO status , Patient's Chart, lab work & pertinent test results  Airway Mallampati: I  TM Distance: >3 FB Neck ROM: Full    Dental   Pulmonary Current Smoker,    Pulmonary exam normal        Cardiovascular hypertension, Pt. on medications Normal cardiovascular exam     Neuro/Psych    GI/Hepatic   Endo/Other    Renal/GU      Musculoskeletal   Abdominal   Peds  Hematology   Anesthesia Other Findings   Reproductive/Obstetrics                             Anesthesia Physical Anesthesia Plan  ASA: II  Anesthesia Plan: General   Post-op Pain Management:    Induction: Intravenous  Airway Management Planned: LMA  Additional Equipment:   Intra-op Plan:   Post-operative Plan: Extubation in OR  Informed Consent: I have reviewed the patients History and Physical, chart, labs and discussed the procedure including the risks, benefits and alternatives for the proposed anesthesia with the patient or authorized representative who has indicated his/her understanding and acceptance.     Plan Discussed with: Surgeon  Anesthesia Plan Comments: (Drank coffe with creamer at [place] till Noon. [place] for LMA.)        Anesthesia Quick Evaluation

## 2016-11-08 NOTE — Anesthesia Postprocedure Evaluation (Addendum)
Anesthesia Post Note  Patient: Beth Alexander  Procedure(s) Performed: Procedure(s) (LRB): LEFT BREAST LUMPECTOMY WITH RADIOACTIVE SEED LOCALIZATION (Left)  Patient location during evaluation: PACU Anesthesia Type: General Level of consciousness: awake Pain management: pain level controlled Vital Signs Assessment: post-procedure vital signs reviewed and stable Respiratory status: spontaneous breathing Cardiovascular status: stable Postop Assessment: no signs of nausea or vomiting and adequate PO intake Anesthetic complications: no        Last Vitals:  Vitals:   11/08/16 1346 11/08/16 1400  BP: 113/81 118/89  Pulse: 76 72  Resp: 13 17  Temp: 36.9 C     Last Pain:  Vitals:   11/08/16 1400  TempSrc:   PainSc: 0-No pain   Pain Goal:                 HATCHETT JR,JOHN FRANKLIN

## 2016-11-08 NOTE — H&P (Signed)
History of Present Illness Patient words: ADH.  The patient is a 66 year old female who presents with a complaint of Breast problems. Referred by Dr. Jeanie Cooks for atypical ductal hyperplasia left breast  This is a 66 year old female who recently underwent routine screening mammogram. Her last mammogram was about 4 years prior. This showed some suspicious findings in the lower outer quadrant of her left breast. She underwent further workup including diagnostic mammogram which showed some pleomorphic calcifications in the lower outer quadrant left breast banding an area 4.7 cm. There is no suspicious mass. She underwent a left stereotactic core needle biopsy on 09/04/14. A clip was placed at the time of biopsy. 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 She presents now for excision.  Menarche age 43 First pregnancy age 4 Breast-feed none Hormones none Menopause age 73 Family history had breast cancer at age 57  CLINICAL DATA: Screening.  EXAM: DIGITAL SCREENING BILATERAL MAMMOGRAM WITH CAD  COMPARISON: Previous exam(s).  ACR Breast Density Category b: There are scattered areas of fibroglandular density.  FINDINGS: In the left breast, calcifications warrant further evaluation. In the right breast, no findings suspicious for malignancy. Images were processed with CAD.  IMPRESSION: Further evaluation is suggested for calcifications in the left breast.  RECOMMENDATION: Diagnostic mammogram of the left breast. (Code:[id number])  The patient will be contacted regarding the findings, and additional imaging will be scheduled.  BI-RADS CATEGORY 0: Incomplete. Need additional imaging evaluation and/or prior mammograms for comparison.   Electronically Signed By: [name] M.D. On: 08/16/2016 11:41  CLINICAL DATA: Patient was called back from screening mammogram for left breast calcifications.  EXAM: DIGITAL  DIAGNOSTIC LEFT MAMMOGRAM WITH CAD  COMPARISON: Previous exam(s).  ACR Breast Density Category b: There are scattered areas of fibroglandular density.  FINDINGS: Additional imaging of the left breast was performed. In the lower outer quadrant are calcifications spanning an area of 4.7 cm. They are pleomorphic and in a segmental distribution. They are concerning. There is no suspicious mass.  Mammographic images were processed with CAD.  IMPRESSION: Suspicious left breast calcifications.  RECOMMENDATION: Stereotactic biopsy of the left breast calcifications is recommended. The biopsy will be scheduled at the patient's convenience.  I have discussed the findings and recommendations with the patient. Results were also provided in writing at the conclusion of the visit. If applicable, a reminder letter will be sent to the patient regarding the next appointment.  BI-RADS CATEGORY 5: Highly suggestive of malignancy.   Electronically Signed By: [name] M.D. On: 08/29/2016 14:05  CLINICAL DATA: Patient with left breast calcifications presents today for stereotactic biopsy.  EXAM: LEFT BREAST STEREOTACTIC CORE NEEDLE BIOPSY  COMPARISON: Previous exams.  FINDINGS: The patient and I discussed the procedure of stereotactic-guided biopsy including benefits and alternatives. We discussed the high likelihood of a successful procedure. We discussed the risks of the procedure including infection, bleeding, tissue injury, clip migration, and inadequate sampling. Informed written consent was given. The usual time out protocol was performed immediately prior to the procedure.  Using sterile technique and 1% Lidocaine as local anesthetic, under stereotactic guidance, a 9 gauge vacuum assisted device was used to perform core needle biopsy of calcifications in the lower outer quadrant of the left breast using a lateral approach. Specimen radiograph was  performed showing calcifications. Specimens with calcifications are identified for pathology.  At the conclusion of the procedure, a coil shaped tissue marker clip was deployed into the biopsy cavity. Follow-up 2-view mammogram was performed and dictated separately.  IMPRESSION: Stereotactic-guided biopsy of left breast calcifications. No apparent complications.  Electronically Signed: By: [name] M.D. On: 09/04/2016 11:20  ADDENDUM: Pathology revealed fibrocystic changes with atypical ductal hyperplasia and calcifications in the left breast. This was found to be concordant by Dr. [name]. Pathology results were discussed with the patient by telephone. The patient reported doing well after the biopsy with tenderness at the site. Post biopsy instructions and care were reviewed and questions were answered. The patient was encouraged to call [facility] for any additional concerns. Surgical consultation has been arranged with Dr. Donnie Mesa at [facility] on September 17, 2016.  There are similar appearing left breast calcifications, both anterior and posterior to the biopsy site spanning 4.7 cm, and would consider bracketing of the calcifications at the time of surgery.  Pathology results reported by [name] RN, BSN on 09/05/2016.   Electronically Signed By: [name] M.D. On: 09/05/2016 10:20   Other Problems  High blood pressure  Past Surgical History  Breast Biopsy Left.  Diagnostic Studies History Colonoscopy never Mammogram within last year Pap Smear 1-5 years ago  Allergies  No Known Drug Allergies   Medication History  Aspirin ('325MG'$  Tablet, Oral) Active. Norvasc ('5MG'$  Tablet, Oral) Active. Medications Reconciled  Social History  Alcohol use Recently quit alcohol use. Caffeine use Coffee. No drug use Tobacco use Current some day smoker.  Family  History  Breast Cancer Mother. Colon Cancer Brother. Heart Disease Father.  Pregnancy / Birth History Age at menarche 84 years. Age of menopause <46 Contraceptive History Oral contraceptives. Gravida 2 Maternal age 36-20 Para 2 Regular periods     Review of Systems  General Not Present- Appetite Loss, Chills, Fatigue, Fever, Night Sweats, Weight Gain and Weight Loss. HEENT Not Present- Earache, Hearing Loss, Hoarseness, Nose Bleed, Oral Ulcers, Ringing in the Ears, Seasonal Allergies, Sinus Pain, Sore Throat, Visual Disturbances, Wears glasses/contact lenses and Yellow Eyes. Respiratory Present- Snoring. Not Present- Bloody sputum, Chronic Cough, Difficulty Breathing and Wheezing. Breast Not Present- Breast Mass, Breast Pain, Nipple Discharge and Skin Changes. Cardiovascular Not Present- Chest Pain, Difficulty Breathing Lying Down, Leg Cramps, Palpitations, Rapid Heart Rate, Shortness of Breath and Swelling of Extremities. Gastrointestinal Not Present- Abdominal Pain, Bloating, Bloody Stool, Change in Bowel Habits, Chronic diarrhea, Constipation, Difficulty Swallowing, Excessive gas, Gets full quickly at meals, Hemorrhoids, Indigestion, Nausea, Rectal Pain and Vomiting. Female Genitourinary Not Present- Frequency, Nocturia, Painful Urination, Pelvic Pain and Urgency. Musculoskeletal Not Present- Back Pain, Joint Pain, Joint Stiffness, Muscle Pain, Muscle Weakness and Swelling of Extremities. Neurological Not Present- Decreased Memory, Fainting, Headaches, Numbness, Seizures, Tingling, Tremor, Trouble walking and Weakness. Psychiatric Not Present- Anxiety, Bipolar, Change in Sleep Pattern, Depression, Fearful and Frequent crying. Endocrine Not Present- Cold Intolerance, Excessive Hunger, Hair Changes, Heat Intolerance, Hot flashes and New Diabetes. Hematology Not Present- Blood Thinners, Easy Bruising, Excessive bleeding, Gland problems, HIV and Persistent  Infections.  Vitals Weight: 146.8 lb Height: 62in Body Surface Area: 1.68 m Body Mass Index: 26.85 kg/m  Temp.: 98.74F(Oral)  Pulse: 84 (Regular)  BP: 130/70 (Sitting, Left Arm, Standard)      Physical Exam   The physical exam findings are as follows: Note:WDWN in NAD Eyes: Pupils equal, round; sclera anicteric HENT: Oral mucosa moist; good dentition Neck: No masses palpated, no thyromegaly Breasts: symmetric; no nipple retraction or discharge. Some slight fibrocystic changes bilateral lateral breasts. No dominant palpable masses Lungs: CTA bilaterally; normal respiratory effort CV: Regular rate and  rhythm; no murmurs; extremities well-perfused with no edema Abd: +bowel sounds, soft, non-tender, no palpable organomegaly; no palpable hernias Skin: Warm, dry; no sign of jaundice Psychiatric - alert and oriented x 4; calm mood and affect    Assessment & Plan  ATYPICAL DUCTAL HYPERPLASIA OF LEFT BREAST (N60.92)  Current Plans Schedule for Surgery - Left radioactive seed localized lumpectomy. The surgical procedure has been discussed with the patient. Potential risks, benefits, alternative treatments, and expected outcomes have been explained. All of the patient's questions at this time have been answered. The likelihood of reaching the patient's treatment goal is good. The patient understand the proposed surgical procedure and wishes to proceed.  Imogene Burn. Georgette Dover, MD, [facility]  General/ Trauma Surgery  11/08/2016 11:14 AM

## 2016-11-08 NOTE — Anesthesia Procedure Notes (Signed)
Procedure Name: LMA Insertion Date/Time: 11/08/2016 12:41 PM Performed by: Toula Moos L Pre-anesthesia Checklist: Patient identified, Emergency Drugs available, Suction available, Patient being monitored and Timeout performed Patient Re-evaluated:Patient Re-evaluated prior to inductionOxygen Delivery Method: Circle system utilized Preoxygenation: Pre-oxygenation with 100% oxygen Intubation Type: IV induction Ventilation: Mask ventilation without difficulty LMA: LMA inserted LMA Size: 4.0 Number of attempts: 1 Airway Equipment and Method: Bite block Placement Confirmation: positive ETCO2 Tube secured with: Tape Dental Injury: Teeth and Oropharynx as per pre-operative assessment

## 2016-11-08 NOTE — Discharge Instructions (Signed)
Call your surgeon if you experience:   1.  Fever over 101.0. 2.  Inability to urinate. 3.  Nausea and/or vomiting. 4.  Extreme swelling or bruising at the surgical site. 5.  Continued bleeding from the incision. 6.  Increased pain, redness or drainage from the incision. 7.  Problems related to your pain medication. 8.  Any problems and/or concerns Post Anesthesia Home Care Instructions  Activity: Get plenty of rest for the remainder of the day. A responsible adult should stay with you for 24 hours following the procedure.  For the next 24 hours, DO NOT: -Drive a car -Paediatric nurse -Drink alcoholic beverages -Take any medication unless instructed by your physician -Make any legal decisions or sign important papers.  Meals: Start with liquid foods such as gelatin or soup. Progress to regular foods as tolerated. Avoid greasy, spicy, heavy foods. If nausea and/or vomiting occur, drink only clear liquids until the nausea and/or vomiting subsides. Call your physician if vomiting continues.  Special Instructions/Symptoms: Your throat may feel dry or sore from the anesthesia or the breathing tube placed in your throat during surgery. If this causes discomfort, gargle with warm salt water. The discomfort should disappear within 24 hours.  If you had a scopolamine patch placed behind your ear for the management of post- operative nausea and/or vomiting:  1. The medication in the patch is effective for 72 hours, after which it should be removed.  Wrap patch in a tissue and discard in the trash. Wash hands thoroughly with soap and water. 2. You may remove the patch earlier than 72 hours if you experience unpleasant side effects which may include dry mouth, dizziness or visual disturbances. 3. Avoid touching the patch. Wash your hands with soap and water after contact with the patch.       [facility] Phone Number [phone]  BREAST BIOPSY/ PARTIAL MASTECTOMY:  POST OP INSTRUCTIONS  Always review your discharge instruction sheet given to you by the facility where your surgery was performed.  IF YOU HAVE DISABILITY OR FAMILY LEAVE FORMS, YOU MUST BRING THEM TO THE OFFICE FOR PROCESSING.  DO NOT GIVE THEM TO YOUR DOCTOR.  1. A prescription for pain medication may be given to you upon discharge.  Take your pain medication as prescribed, if needed.  If narcotic pain medicine is not needed, then you may take acetaminophen (Tylenol) or ibuprofen (Advil) as needed. 2. Take your usually prescribed medications unless otherwise directed 3. If you need a refill on your pain medication, please contact your pharmacy.  They will contact our office to request authorization.  Prescriptions will not be filled after 5pm or on week-ends. 4. You should eat very light the first 24 hours after surgery, such as soup, crackers, pudding, etc.  Resume your normal diet the day after surgery. 5. Most patients will experience some swelling and bruising in the breast.  Ice packs and a good support bra will help.  Swelling and bruising can take several days to resolve.  6. It is common to experience some constipation if taking pain medication after surgery.  Increasing fluid intake and taking a stool softener will usually help or prevent this problem from occurring.  A mild laxative (Milk of Magnesia or Miralax) should be taken according to package directions if there are no bowel movements after 48 hours. 7. Unless discharge instructions indicate otherwise, you may remove your bandages 48 hours after surgery, and you may shower at that time.  You will have  steri-strips (small skin tapes) in place directly over the incision.  These strips should be left on the skin for 7-10 days.   Any sutures or staples will be removed at the office during your follow-up visit. 8. ACTIVITIES:  You may resume regular daily activities (gradually increasing) beginning the next day.  Wearing a good support bra  or sports bra minimizes pain and swelling.  You may have sexual intercourse when it is comfortable. a. You may drive when you no longer are taking prescription pain medication, you can comfortably wear a seatbelt, and you can safely maneuver your car and apply brakes. b. RETURN TO WORK:  1-2 weeks 9. You should see your doctor in the office for a follow-up appointment approximately two weeks after your surgery.  Your doctors nurse will typically make your follow-up appointment when she calls you with your pathology report.  Expect your pathology report 2-3 business days after your surgery.  You may call to check if you do not hear from [place] after three days. 10. OTHER INSTRUCTIONS: _______________________________________________________________________________________________ _____________________________________________________________________________________________________________________________________ _____________________________________________________________________________________________________________________________________ _____________________________________________________________________________________________________________________________________  WHEN TO CALL YOUR DOCTOR: 1. Fever over 101.0 2. Nausea and/or vomiting. 3. Extreme swelling or bruising. 4. Continued bleeding from incision. 5. Increased pain, redness, or drainage from the incision.  The clinic staff is available to answer your questions during regular business hours.  Please dont hesitate to call and ask to speak to one of the nurses for clinical concerns.  If you have a medical emergency, go to the nearest emergency room or call 911.  A surgeon from [facility] is always on call at the hospital.  For further questions, please visit [email or web address]

## 2016-11-08 NOTE — Transfer of Care (Signed)
Immediate Anesthesia Transfer of Care Note  Patient: Beth Alexander  Procedure(s) Performed: Procedure(s): LEFT BREAST LUMPECTOMY WITH RADIOACTIVE SEED LOCALIZATION (Left)  Patient Location: PACU  Anesthesia Type:General  Level of Consciousness: awake, alert  and patient cooperative  Airway & Oxygen Therapy: Patient Spontanous Breathing and Patient connected to face mask oxygen  Post-op Assessment: Report given to RN, Post -op Vital signs reviewed and stable and Patient moving all extremities  Post vital signs: Reviewed and stable  Last Vitals:  Vitals:   11/08/16 0810  BP: 134/67  Pulse: 78  Resp: 18  Temp: 37 C    Last Pain:  Vitals:   11/08/16 0810  TempSrc: Oral         Complications: No apparent anesthesia complications

## 2016-11-11 ENCOUNTER — Encounter (HOSPITAL_BASED_OUTPATIENT_CLINIC_OR_DEPARTMENT_OTHER): Payer: Self-pay | Admitting: Surgery

## 2016-11-12 ENCOUNTER — Encounter: Payer: Self-pay | Admitting: Hematology and Oncology

## 2016-11-12 ENCOUNTER — Telehealth: Payer: Self-pay | Admitting: Hematology and Oncology

## 2016-11-12 NOTE — Telephone Encounter (Signed)
Appt has been scheduled for the pt to see Dr. Lindi Adie on 1/29 at 345pm. Pt demographics has been verified. Pt agreed to the appt date and time. Letter mailed.

## 2016-11-20 ENCOUNTER — Encounter: Payer: Self-pay | Admitting: Hematology and Oncology

## 2016-11-20 ENCOUNTER — Ambulatory Visit (HOSPITAL_BASED_OUTPATIENT_CLINIC_OR_DEPARTMENT_OTHER): Payer: BLUE CROSS/BLUE SHIELD | Admitting: Hematology and Oncology

## 2016-11-20 DIAGNOSIS — N6092 Unspecified benign mammary dysplasia of left breast: Secondary | ICD-10-CM | POA: Diagnosis not present

## 2016-11-20 DIAGNOSIS — I10 Essential (primary) hypertension: Secondary | ICD-10-CM

## 2016-11-20 NOTE — Assessment & Plan Note (Addendum)
Left lumpectomy: 11/08/2016: Atypical ductal hyperplasia with calcifications, LCIS with calcifications, fibrocystic changes Mammogram 08/16/2016: Left breast calcifications  Pathology review: I discussed the difference between atypical ductal hyperplasia, DCIS and invasive breast cancer. I explained to her that atypical ductal hyperplasia is characterized by a proliferation of uniform epithelial cells filling part, but not the entirety, of the involved duct. ADH is associated with an increased risk of both ipsilateral and contralateral breast cancer and thus provides evidence of underlying breast abnormalities that predispose to breast cancer.   Prognosis:Using the American Cancer Society breast cancer risk assessment tool, her risk of breast cancer in 5 years is at 1.1% ( average woman's risk is 0.6%), her lifetime risk of breast cancers at 15.4% ( average risk is a 10%)  I discussed the risks and benefits of tamoxifen therapy. I believe that the risks far outweigh any benefits from tamoxifen. Instead I recommended the following 1. Exercise 30 minutes daily 2. Weight loss 3. Increasing fruits and vegetables and less red meat I believe all of the above would extensively decrease her risk of breast cancer as well as other cancers including cancers of the colon substantially.  Surveillance plan: Annual mammograms and breast exams

## 2016-11-20 NOTE — Progress Notes (Signed)
[facility] CONSULT NOTE  Patient Care Team: Carlena Hurl, PA-C as PCP - General (Family Medicine)  CHIEF COMPLAINTS/PURPOSE OF CONSULTATION:  Atypical ductal hyperplasia left breast  HISTORY OF PRESENTING ILLNESS:  Beth Alexander 66 y.o. female is here because of recent diagnosis of atypical ductal hyperplasia of the left breast. Patient had a routine screening mammogram the detected abnormality in the breast. This was further evaluated and she underwent lumpectomy on 11/08/2016. She is healing very well from the recent surgery. She was noted to have atypical ductal hyperplasia. She was referred to [place] for discussion regarding risk reduction options.  I reviewed her records extensively and collaborated the history with the patient.  MEDICAL HISTORY:  Past Medical History:  Diagnosis Date  . Hypertension   . Tobacco use     SURGICAL HISTORY: Past Surgical History:  Procedure Laterality Date  . BREAST LUMPECTOMY WITH RADIOACTIVE SEED LOCALIZATION Left 11/08/2016   Procedure: LEFT BREAST LUMPECTOMY WITH RADIOACTIVE SEED LOCALIZATION;  Surgeon: Donnie Mesa, MD;  Location: [facility];  Service: General;  Laterality: Left;    SOCIAL HISTORY: Social History   Social History  . Marital status: Divorced    Spouse name: N/A  . Number of children: N/A  . Years of education: N/A   Occupational History  . Not on file.   Social History Main Topics  . Smoking status: Current Every Day Smoker  . Smokeless tobacco: Never Used  . Alcohol use No  . Drug use: No  . Sexual activity: Not on file   Other Topics Concern  . Not on file   Social History Narrative  . No narrative on file    FAMILY HISTORY: Mother had breast cancer age 17 and she died of metastatic breast cancer. ALLERGIES:  has No Known Allergies.  MEDICATIONS:  Current Outpatient Prescriptions  Medication Sig Dispense Refill  . amLODipine (NORVASC) 5 MG tablet Take 1 tablet (5 mg  total) by mouth daily. 30 tablet 5  . aspirin EC 81 MG tablet Take 81 mg by mouth daily.    Marland Kitchen HYDROcodone-acetaminophen (NORCO/VICODIN) 5-325 MG tablet Take 1-2 tablets by mouth every 6 (six) hours as needed for moderate pain. 30 tablet 0  . triamcinolone cream (KENALOG) 0.1 % Apply 1 application topically 2 (two) times daily. 30 g 0   No current facility-administered medications for this visit.     REVIEW OF SYSTEMS:   Constitutional: Denies fevers, chills or abnormal night sweats Eyes: Denies blurriness of vision, double vision or watery eyes Ears, nose, mouth, throat, and face: Denies mucositis or sore throat Respiratory: Denies cough, dyspnea or wheezes Cardiovascular: Denies palpitation, chest discomfort or lower extremity swelling Gastrointestinal:  Denies nausea, heartburn or change in bowel habits Skin: Denies abnormal skin rashes Lymphatics: Denies new lymphadenopathy or easy bruising Neurological:Denies numbness, tingling or new weaknesses Behavioral/Psych: Mood is stable, no new changes  Breast: Recent lumpectomy All other systems were reviewed with the patient and are negative.  PHYSICAL EXAMINATION: ECOG PERFORMANCE STATUS: 0 - Asymptomatic  Vitals:   11/20/16 1441  BP: 92/63  Pulse: 77  Resp: 18  Temp: 98.3 F (36.8 C)   Filed Weights   11/20/16 1441  Weight: 149 lb 1.6 oz (67.6 kg)    GENERAL:alert, no distress and comfortable SKIN: skin color, texture, turgor are normal, no rashes or significant lesions EYES: normal, conjunctiva are pink and non-injected, sclera clear OROPHARYNX:no exudate, no erythema and lips, buccal mucosa, and tongue normal  NECK:  supple, thyroid normal size, non-tender, without nodularity LYMPH:  no palpable lymphadenopathy in the cervical, axillary or inguinal LUNGS: clear to auscultation and percussion with normal breathing effort HEART: regular rate & rhythm and no murmurs and no lower extremity edema ABDOMEN:abdomen soft,  non-tender and normal bowel sounds Musculoskeletal:no cyanosis of digits and no clubbing  PSYCH: alert & oriented x 3 with fluent speech NEURO: no focal motor/sensory deficits  RADIOGRAPHIC STUDIES: I have personally reviewed the radiological reports and agreed with the findings in the report.  ASSESSMENT AND PLAN:  Atypical ductal hyperplasia of left breast Left lumpectomy: 11/08/2016: Atypical ductal hyperplasia with calcifications, LCIS with calcifications, fibrocystic changes Mammogram 08/16/2016: Left breast calcifications  Pathology review: I discussed the difference between atypical ductal hyperplasia, DCIS and invasive breast cancer. I explained to her that atypical ductal hyperplasia is characterized by a proliferation of uniform epithelial cells filling part, but not the entirety, of the involved duct. ADH is associated with an increased risk of both ipsilateral and contralateral breast cancer and thus provides evidence of underlying breast abnormalities that predispose to breast cancer.   Prognosis:Using the American Cancer Society breast cancer risk assessment tool, her risk of breast cancer in 5 years is at 5% ( average woman's risk is 1.7 %), her lifetime risk of breast cancers at 17.9% ( average risk is a 6%)  I discussed the risks and benefits of tamoxifen therapy. I believe that the risks far outweigh any benefits from tamoxifen. Patient is not keen on taking any more medications as well.  Instead I recommended the following 1. Exercise 30 minutes daily 2. Weight loss 3. Increasing fruits and vegetables and less red meat 4. Vitamin D supplement I believe all of the above would extensively decrease her risk of breast cancer as well as other cancers including cancers of the colon substantially.  Surveillance plan: Annual mammograms and breast exams  All questions were answered. The patient knows to call the clinic with any problems, questions or concerns.    Rulon Eisenmenger, MD 11/20/16

## 2017-05-06 NOTE — Addendum Note (Signed)
Addendum  created 05/06/17 1728 by Suzette Battiest, MD   Sign clinical note

## 2017-10-19 ENCOUNTER — Emergency Department (HOSPITAL_COMMUNITY)
Admission: EM | Admit: 2017-10-19 | Discharge: 2017-10-19 | Disposition: A | Discharge status: Home / Self Care | Payer: Medicare HMO | Attending: Emergency Medicine | Admitting: Emergency Medicine

## 2017-10-19 ENCOUNTER — Emergency Department (HOSPITAL_COMMUNITY): Payer: Medicare HMO

## 2017-10-19 ENCOUNTER — Other Ambulatory Visit: Payer: Self-pay

## 2017-10-19 ENCOUNTER — Encounter (HOSPITAL_COMMUNITY): Payer: Self-pay | Admitting: Obstetrics and Gynecology

## 2017-10-19 DIAGNOSIS — M5412 Radiculopathy, cervical region: Secondary | ICD-10-CM | POA: Insufficient documentation

## 2017-10-19 DIAGNOSIS — F17200 Nicotine dependence, unspecified, uncomplicated: Secondary | ICD-10-CM | POA: Insufficient documentation

## 2017-10-19 DIAGNOSIS — Z7982 Long term (current) use of aspirin: Secondary | ICD-10-CM | POA: Insufficient documentation

## 2017-10-19 DIAGNOSIS — I10 Essential (primary) hypertension: Secondary | ICD-10-CM | POA: Insufficient documentation

## 2017-10-19 DIAGNOSIS — Z79899 Other long term (current) drug therapy: Secondary | ICD-10-CM | POA: Insufficient documentation

## 2017-10-19 DIAGNOSIS — M542 Cervicalgia: Secondary | ICD-10-CM | POA: Diagnosis present

## 2017-10-19 LAB — CBC
HEMATOCRIT: 39.9 % (ref 36.0–46.0)
Hemoglobin: 13.6 g/dL (ref 12.0–15.0)
MCH: 32.7 pg (ref 26.0–34.0)
MCHC: 34.1 g/dL (ref 30.0–36.0)
MCV: 95.9 fL (ref 78.0–100.0)
PLATELETS: 363 10*3/uL (ref 150–400)
RBC: 4.16 MIL/uL (ref 3.87–5.11)
RDW: 13.2 % (ref 11.5–15.5)
WBC: 5.8 10*3/uL (ref 4.0–10.5)

## 2017-10-19 LAB — BASIC METABOLIC PANEL
Anion gap: 8 (ref 5–15)
BUN: 8 mg/dL (ref 6–20)
CHLORIDE: 102 mmol/L (ref 101–111)
CO2: 28 mmol/L (ref 22–32)
CREATININE: 0.64 mg/dL (ref 0.44–1.00)
Calcium: 9.6 mg/dL (ref 8.9–10.3)
GFR calc Af Amer: >60 mL/min (ref >60)
GFR calc non Af Amer: >60 mL/min (ref >60)
GLUCOSE: 83 mg/dL (ref 65–99)
Potassium: 4 mmol/L (ref 3.5–5.1)
Sodium: 138 mmol/L (ref 135–145)

## 2017-10-19 LAB — I-STAT TROPONIN, ED: Troponin i, poc: 0 ng/mL (ref 0.00–0.08)

## 2017-10-19 MED ORDER — HYDROCODONE-ACETAMINOPHEN 5-325 MG PO TABS: 2.0000 | ORAL_TABLET | ORAL | 0 refills | Status: DC | PRN

## 2017-10-19 MED ORDER — PREDNISONE 10 MG PO TABS: ORAL_TABLET | ORAL | 0 refills | Status: DC

## 2017-10-19 NOTE — ED Triage Notes (Signed)
Pt reports right arm pain radiating up her neck to the jaw area. Pt reports the pain has been going on for a few days. Pt denies any injury/trauma to the area.

## 2017-10-19 NOTE — ED Provider Notes (Signed)
[facility] [facility] DEPT Provider Note   CSN: [id number] Arrival date & time: 10/19/17  1255     History   Chief Complaint Chief Complaint  Patient presents with  . Neck Pain  . Arm Pain    HPI Beth Alexander is a 66 y.o. female.  The history is provided by the patient. No language interpreter was used.  Neck Pain   This is a new problem. The current episode started more than 2 days ago. The problem occurs constantly. The problem has been gradually worsening. The pain is associated with nothing. There has been no fever. The pain is present in the generalized neck. The quality of the pain is described as aching. The pain radiates to the right shoulder. The pain is moderate. The pain is the same all the time. Stiffness is present all day. Pertinent negatives include no chest pain. She has tried nothing for the symptoms. The treatment provided no relief.  Arm Pain  Pertinent negatives include no chest pain.  Pt complains of soreness in her neck.  Pt reports pain shoots from the back of her neck into her right shoulder.  Pt points to lower neck and trapezius as area of pain.  Past Medical History:  Diagnosis Date  . Hypertension   . Tobacco use     Patient Active Problem List   Diagnosis Date Noted  . 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 BREAST LUMPECTOMY WITH RADIOACTIVE SEED LOCALIZATION Left 11/08/2016   Procedure: LEFT BREAST LUMPECTOMY WITH RADIOACTIVE SEED LOCALIZATION;  Surgeon: Donnie Mesa, MD;  Location: [facility];  Service: General;  Laterality: Left;    OB History    Gravida Para Term Preterm AB Living             2   SAB TAB Ectopic Multiple Live Births                   Home Medications    Prior to Admission medications   Medication Sig Start Date End Date Taking? Authorizing Provider  acetaminophen (TYLENOL) 500 MG tablet Take 500 mg by mouth  every 6 (six) hours as needed for mild pain or headache.   Yes [provider]  amLODipine (NORVASC) 5 MG tablet Take 1 tablet (5 mg total) by mouth daily. 06/30/14  Yes Tysinger, Camelia Eng, PA-C  aspirin EC 81 MG tablet Take 81 mg by mouth daily.   Yes [provider]  ibuprofen (ADVIL,MOTRIN) 200 MG tablet Take 200 mg by mouth every 6 (six) hours as needed for fever or moderate pain.   Yes [provider]  Menthol-Methyl Salicylate (MUSCLE RUB) 10-15 % CREA Apply 1 application topically as needed for muscle pain.   Yes [provider]  HYDROcodone-acetaminophen (NORCO/VICODIN) 5-325 MG tablet Take 2 tablets by mouth every 4 (four) hours as needed. 10/19/17   Fransico Meadow, PA-C  predniSONE (DELTASONE) 10 MG tablet 6,5,4,3,2,1 taper 10/19/17   Caryl Ada K, PA-C  triamcinolone cream (KENALOG) 0.1 % Apply 1 application topically 2 (two) times daily. Patient not taking: Reported on 10/19/2017 04/09/14   Tysinger, Camelia Eng, PA-C    Family History No family history on file.  Social History Social History   Tobacco Use  . Smoking status: Current Every Day Smoker  . Smokeless tobacco: Never Used  Substance Use Topics  . Alcohol use: No  . Drug use: No  Allergies   Patient has no known allergies.   Review of Systems Review of Systems  Cardiovascular: Negative for chest pain.  Musculoskeletal: Positive for neck pain.  All other systems reviewed and are negative.    Physical Exam Updated Vital Signs BP 107/84   Pulse 76   Temp 98.2 F (36.8 C) (Oral)   Resp 18   Ht 5\' 3"  (1.6 m)   Wt 66.2 kg (146 lb)   SpO2 98%   BMI 25.86 kg/m   Physical Exam  Constitutional: She is oriented to person, place, and time. She appears well-developed and well-nourished.  HENT:  Head: Normocephalic.  Right Ear: External ear normal.  Left Ear: External ear normal.  Nose: Nose normal.  Mouth/Throat: Oropharynx is clear and moist.  Eyes: EOM are normal.   Neck: Normal range of motion.  Tender lower cervical spine,  Shoulder nontender full range of motion,    Cardiovascular: Normal rate.  Pulmonary/Chest: Effort normal.  Abdominal: Soft. She exhibits no distension.  Musculoskeletal: Normal range of motion.  Neurological: She is alert and oriented to person, place, and time.  Skin: Skin is warm.  Psychiatric: She has a normal mood and affect.  Nursing note and vitals reviewed.    ED Treatments / Results  Labs (all labs ordered are listed, but only abnormal results are displayed) Labs Reviewed  BASIC METABOLIC PANEL  CBC  I-STAT TROPONIN, ED    EKG  EKG Interpretation None       Radiology Dg Cervical Spine Complete  Result Date: 10/19/2017 CLINICAL DATA:  Neck pain EXAM: CERVICAL SPINE - COMPLETE 4+ VIEW COMPARISON:  None. FINDINGS: Normal alignment. No fracture or mass. Mild disc degeneration and mild facet degeneration in the cervical spine. Mild left foraminal narrowing C3-4 C4-5 and C5-6 due to spurring. Mild right foraminal narrowing C6-7. Carotid artery calcification left greater than right. IMPRESSION: Mild cervical spine degenerative change.  No acute abnormality. Electronically Signed   By: Franchot Gallo M.D.   On: 10/19/2017 19:13    Procedures Procedures (including critical care time)  Medications Ordered in ED Medications - No data to display   Initial Impression / Assessment and Plan / ED Course  I have reviewed the triage vital signs and the nursing notes.  Pertinent labs & imaging results that were available during my care of the patient were reviewed by me and considered in my medical decision making (see chart for details).     Pt counseled on cervical radiculopathy.   Pt sees Dr. Jeanie Cooks.   I will treat her with prednisone and hydrocodone. She is advised to see Dr. Jeanie Cooks for recheck in 1 week   Final Clinical Impressions(s) / ED Diagnoses   Final diagnoses:  Cervical radiculopathy    ED  Discharge Orders        Ordered    HYDROcodone-acetaminophen (NORCO/VICODIN) 5-325 MG tablet  Every 4 hours PRN     10/19/17 2030    predniSONE (DELTASONE) 10 MG tablet     10/19/17 2030    An After Visit Summary was printed and given to the patient.    Sidney Ace 10/19/17 2134    Dorie Rank, MD 10/19/17 2325

## 2017-10-19 NOTE — Discharge Instructions (Signed)
See Dr. Jeanie Cooks for recheck in 3-4 days

## 2017-12-09 ENCOUNTER — Other Ambulatory Visit: Payer: Self-pay

## 2017-12-09 ENCOUNTER — Emergency Department (HOSPITAL_COMMUNITY): Payer: Medicare HMO

## 2017-12-09 ENCOUNTER — Encounter (HOSPITAL_COMMUNITY): Payer: Self-pay

## 2017-12-09 ENCOUNTER — Inpatient Hospital Stay (HOSPITAL_COMMUNITY)
Admission: EM | Admit: 2017-12-09 | Discharge: 2017-12-12 | DRG: 181 | Disposition: A | Discharge status: Home / Self Care | Payer: Medicare HMO | Attending: Internal Medicine | Admitting: Internal Medicine

## 2017-12-09 DIAGNOSIS — J90 Pleural effusion, not elsewhere classified: Secondary | ICD-10-CM | POA: Diagnosis present

## 2017-12-09 DIAGNOSIS — Z8249 Family history of ischemic heart disease and other diseases of the circulatory system: Secondary | ICD-10-CM

## 2017-12-09 DIAGNOSIS — I313 Pericardial effusion (noninflammatory): Secondary | ICD-10-CM | POA: Diagnosis present

## 2017-12-09 DIAGNOSIS — Z7982 Long term (current) use of aspirin: Secondary | ICD-10-CM

## 2017-12-09 DIAGNOSIS — C3411 Malignant neoplasm of upper lobe, right bronchus or lung: Secondary | ICD-10-CM | POA: Diagnosis not present

## 2017-12-09 DIAGNOSIS — Z87891 Personal history of nicotine dependence: Secondary | ICD-10-CM

## 2017-12-09 DIAGNOSIS — Z9889 Other specified postprocedural states: Secondary | ICD-10-CM

## 2017-12-09 DIAGNOSIS — J9801 Acute bronchospasm: Secondary | ICD-10-CM | POA: Diagnosis present

## 2017-12-09 DIAGNOSIS — Z803 Family history of malignant neoplasm of breast: Secondary | ICD-10-CM

## 2017-12-09 DIAGNOSIS — Z800 Family history of malignant neoplasm of digestive organs: Secondary | ICD-10-CM

## 2017-12-09 DIAGNOSIS — R918 Other nonspecific abnormal finding of lung field: Secondary | ICD-10-CM

## 2017-12-09 DIAGNOSIS — J9819 Other pulmonary collapse: Secondary | ICD-10-CM | POA: Diagnosis present

## 2017-12-09 DIAGNOSIS — N6092 Unspecified benign mammary dysplasia of left breast: Secondary | ICD-10-CM | POA: Diagnosis present

## 2017-12-09 DIAGNOSIS — C771 Secondary and unspecified malignant neoplasm of intrathoracic lymph nodes: Secondary | ICD-10-CM | POA: Diagnosis present

## 2017-12-09 DIAGNOSIS — I2510 Atherosclerotic heart disease of native coronary artery without angina pectoris: Secondary | ICD-10-CM | POA: Diagnosis present

## 2017-12-09 DIAGNOSIS — Z720 Tobacco use: Secondary | ICD-10-CM

## 2017-12-09 DIAGNOSIS — R0602 Shortness of breath: Secondary | ICD-10-CM | POA: Diagnosis not present

## 2017-12-09 DIAGNOSIS — I10 Essential (primary) hypertension: Secondary | ICD-10-CM | POA: Diagnosis present

## 2017-12-09 DIAGNOSIS — Z801 Family history of malignant neoplasm of trachea, bronchus and lung: Secondary | ICD-10-CM

## 2017-12-09 DIAGNOSIS — E876 Hypokalemia: Secondary | ICD-10-CM | POA: Diagnosis present

## 2017-12-09 DIAGNOSIS — J9811 Atelectasis: Secondary | ICD-10-CM

## 2017-12-09 LAB — BASIC METABOLIC PANEL
Anion gap: 11 (ref 5–15)
BUN: 5 mg/dL — AB (ref 6–20)
CHLORIDE: 97 mmol/L — AB (ref 101–111)
CO2: 27 mmol/L (ref 22–32)
Calcium: 8.8 mg/dL — ABNORMAL LOW (ref 8.9–10.3)
Creatinine, Ser: 0.89 mg/dL (ref 0.44–1.00)
GFR calc Af Amer: >60 mL/min (ref >60)
GFR calc non Af Amer: >60 mL/min (ref >60)
Glucose, Bld: 122 mg/dL — ABNORMAL HIGH (ref 65–99)
POTASSIUM: 3 mmol/L — AB (ref 3.5–5.1)
Sodium: 135 mmol/L (ref 135–145)

## 2017-12-09 LAB — CBC
HEMATOCRIT: 35.2 % — AB (ref 36.0–46.0)
Hemoglobin: 11.8 g/dL — ABNORMAL LOW (ref 12.0–15.0)
MCH: 32.2 pg (ref 26.0–34.0)
MCHC: 33.5 g/dL (ref 30.0–36.0)
MCV: 96.2 fL (ref 78.0–100.0)
Platelets: 414 10*3/uL — ABNORMAL HIGH (ref 150–400)
RBC: 3.66 MIL/uL — ABNORMAL LOW (ref 3.87–5.11)
RDW: 13.1 % (ref 11.5–15.5)
WBC: 7.7 10*3/uL (ref 4.0–10.5)

## 2017-12-09 LAB — I-STAT TROPONIN, ED: Troponin i, poc: 0 ng/mL (ref 0.00–0.08)

## 2017-12-09 NOTE — ED Triage Notes (Signed)
Patient here from PCP for X RAYS done at [facility] stating has pneumonia.  Taking PO abx since Friday and not feeling better.  States shortness of breath and chest pain got worse today. Cough for last.  X RAY results in Care everywhere.  A&Ox4.

## 2017-12-10 ENCOUNTER — Emergency Department (HOSPITAL_COMMUNITY): Payer: Medicare HMO

## 2017-12-10 ENCOUNTER — Other Ambulatory Visit: Payer: Self-pay

## 2017-12-10 ENCOUNTER — Encounter (HOSPITAL_COMMUNITY): Payer: Self-pay | Admitting: Internal Medicine

## 2017-12-10 DIAGNOSIS — Z800 Family history of malignant neoplasm of digestive organs: Secondary | ICD-10-CM | POA: Diagnosis not present

## 2017-12-10 DIAGNOSIS — I10 Essential (primary) hypertension: Secondary | ICD-10-CM | POA: Diagnosis present

## 2017-12-10 DIAGNOSIS — J9801 Acute bronchospasm: Secondary | ICD-10-CM | POA: Diagnosis present

## 2017-12-10 DIAGNOSIS — Z803 Family history of malignant neoplasm of breast: Secondary | ICD-10-CM | POA: Diagnosis not present

## 2017-12-10 DIAGNOSIS — J9811 Atelectasis: Secondary | ICD-10-CM | POA: Diagnosis not present

## 2017-12-10 DIAGNOSIS — R918 Other nonspecific abnormal finding of lung field: Secondary | ICD-10-CM | POA: Diagnosis not present

## 2017-12-10 DIAGNOSIS — J90 Pleural effusion, not elsewhere classified: Secondary | ICD-10-CM

## 2017-12-10 DIAGNOSIS — Z720 Tobacco use: Secondary | ICD-10-CM | POA: Diagnosis present

## 2017-12-10 DIAGNOSIS — Z87891 Personal history of nicotine dependence: Secondary | ICD-10-CM | POA: Diagnosis not present

## 2017-12-10 DIAGNOSIS — J9819 Other pulmonary collapse: Secondary | ICD-10-CM | POA: Diagnosis present

## 2017-12-10 DIAGNOSIS — I2510 Atherosclerotic heart disease of native coronary artery without angina pectoris: Secondary | ICD-10-CM | POA: Diagnosis present

## 2017-12-10 DIAGNOSIS — Z7982 Long term (current) use of aspirin: Secondary | ICD-10-CM | POA: Diagnosis not present

## 2017-12-10 DIAGNOSIS — C3411 Malignant neoplasm of upper lobe, right bronchus or lung: Secondary | ICD-10-CM | POA: Diagnosis present

## 2017-12-10 DIAGNOSIS — E876 Hypokalemia: Secondary | ICD-10-CM | POA: Diagnosis present

## 2017-12-10 DIAGNOSIS — N6092 Unspecified benign mammary dysplasia of left breast: Secondary | ICD-10-CM

## 2017-12-10 DIAGNOSIS — R0602 Shortness of breath: Secondary | ICD-10-CM | POA: Diagnosis present

## 2017-12-10 DIAGNOSIS — Z8249 Family history of ischemic heart disease and other diseases of the circulatory system: Secondary | ICD-10-CM | POA: Diagnosis not present

## 2017-12-10 DIAGNOSIS — C771 Secondary and unspecified malignant neoplasm of intrathoracic lymph nodes: Secondary | ICD-10-CM | POA: Diagnosis present

## 2017-12-10 DIAGNOSIS — Z801 Family history of malignant neoplasm of trachea, bronchus and lung: Secondary | ICD-10-CM | POA: Diagnosis not present

## 2017-12-10 DIAGNOSIS — I313 Pericardial effusion (noninflammatory): Secondary | ICD-10-CM | POA: Diagnosis present

## 2017-12-10 LAB — HIV ANTIBODY (ROUTINE TESTING W REFLEX): HIV Screen 4th Generation wRfx: NONREACTIVE

## 2017-12-10 LAB — BASIC METABOLIC PANEL
ANION GAP: 14 (ref 5–15)
BUN: <5 mg/dL — ABNORMAL LOW (ref 6–20)
CHLORIDE: 99 mmol/L — AB (ref 101–111)
CO2: 22 mmol/L (ref 22–32)
Calcium: 8.9 mg/dL (ref 8.9–10.3)
Creatinine, Ser: 0.76 mg/dL (ref 0.44–1.00)
GFR calc non Af Amer: >60 mL/min (ref >60)
GLUCOSE: 165 mg/dL — AB (ref 65–99)
POTASSIUM: 3.4 mmol/L — AB (ref 3.5–5.1)
Sodium: 135 mmol/L (ref 135–145)

## 2017-12-10 LAB — CBC
HEMATOCRIT: 33.6 % — AB (ref 36.0–46.0)
HEMOGLOBIN: 11.3 g/dL — AB (ref 12.0–15.0)
MCH: 32 pg (ref 26.0–34.0)
MCHC: 33.6 g/dL (ref 30.0–36.0)
MCV: 95.2 fL (ref 78.0–100.0)
Platelets: 407 10*3/uL — ABNORMAL HIGH (ref 150–400)
RBC: 3.53 MIL/uL — ABNORMAL LOW (ref 3.87–5.11)
RDW: 12.9 % (ref 11.5–15.5)
WBC: 9.9 10*3/uL (ref 4.0–10.5)

## 2017-12-10 LAB — MAGNESIUM: Magnesium: 1.9 mg/dL (ref 1.7–2.4)

## 2017-12-10 LAB — PROTIME-INR
INR: 1.04
PROTHROMBIN TIME: 13.5 s (ref 11.4–15.2)

## 2017-12-10 LAB — CBG MONITORING, ED: Glucose-Capillary: 156 mg/dL — ABNORMAL HIGH (ref 65–99)

## 2017-12-10 LAB — APTT: APTT: 30 s (ref 24–36)

## 2017-12-10 MED ORDER — HYDROCODONE-ACETAMINOPHEN 5-325 MG PO TABS: 2.0000 | ORAL_TABLET | ORAL | Status: DC | PRN

## 2017-12-10 MED ORDER — POLYETHYLENE GLYCOL 3350 17 G PO PACK: 17.0000 g | PACK | Freq: Every day | ORAL | Status: DC | PRN

## 2017-12-10 MED ORDER — NICOTINE 21 MG/24HR TD PT24
21.0000 mg | MEDICATED_PATCH | Freq: Every day | TRANSDERMAL | Status: DC
Start: 2017-12-10 — End: 2017-12-12
  Administered 2017-12-10 – 2017-12-12 (×3): 21 mg via TRANSDERMAL
  Filled 2017-12-10 (×3): qty 1

## 2017-12-10 MED ORDER — ONDANSETRON HCL 4 MG/2ML IJ SOLN
4.0000 mg | Freq: Four times a day (QID) | INTRAMUSCULAR | Status: DC | PRN
Start: 2017-12-10 — End: 2017-12-12

## 2017-12-10 MED ORDER — DM-GUAIFENESIN ER 30-600 MG PO TB12
1.0000 | ORAL_TABLET | Freq: Two times a day (BID) | ORAL | Status: DC | PRN
  Administered 2017-12-10: 1 via ORAL
  Filled 2017-12-10: qty 1

## 2017-12-10 MED ORDER — MUSCLE RUB 10-15 % EX CREA
1.0000 "application " | TOPICAL_CREAM | CUTANEOUS | Status: DC | PRN
  Filled 2017-12-10: qty 85

## 2017-12-10 MED ORDER — PREDNISONE 20 MG PO TABS
60.0000 mg | ORAL_TABLET | Freq: Once | ORAL | Status: AC
  Administered 2017-12-10: 60 mg via ORAL
  Filled 2017-12-10: qty 3

## 2017-12-10 MED ORDER — ONDANSETRON HCL 4 MG PO TABS: 4.0000 mg | ORAL_TABLET | Freq: Four times a day (QID) | ORAL | Status: DC | PRN

## 2017-12-10 MED ORDER — ACETAMINOPHEN 500 MG PO TABS
500.0000 mg | ORAL_TABLET | Freq: Four times a day (QID) | ORAL | Status: DC | PRN
Start: 2017-12-10 — End: 2017-12-12
  Administered 2017-12-10: 500 mg via ORAL
  Filled 2017-12-10: qty 1

## 2017-12-10 MED ORDER — IOPAMIDOL (ISOVUE-300) INJECTION 61%
INTRAVENOUS | Status: AC
  Administered 2017-12-10: 75 mL
  Filled 2017-12-10: qty 75

## 2017-12-10 MED ORDER — AMLODIPINE BESYLATE 5 MG PO TABS
5.0000 mg | ORAL_TABLET | Freq: Every day | ORAL | Status: DC
  Administered 2017-12-10 – 2017-12-12 (×3): 5 mg via ORAL
  Filled 2017-12-10 (×3): qty 1

## 2017-12-10 MED ORDER — POTASSIUM CHLORIDE CRYS ER 20 MEQ PO TBCR
40.0000 meq | EXTENDED_RELEASE_TABLET | Freq: Once | ORAL | Status: AC
  Administered 2017-12-10: 40 meq via ORAL
  Filled 2017-12-10: qty 2

## 2017-12-10 MED ORDER — IBUPROFEN 200 MG PO TABS: 200.0000 mg | ORAL_TABLET | Freq: Four times a day (QID) | ORAL | Status: DC | PRN

## 2017-12-10 MED ORDER — ASPIRIN EC 81 MG PO TBEC
81.0000 mg | DELAYED_RELEASE_TABLET | Freq: Every day | ORAL | Status: DC
  Administered 2017-12-10 – 2017-12-12 (×3): 81 mg via ORAL
  Filled 2017-12-10 (×3): qty 1

## 2017-12-10 MED ORDER — SODIUM CHLORIDE 0.9 % IV SOLN
INTRAVENOUS | Status: DC
  Administered 2017-12-10 – 2017-12-11 (×2): via INTRAVENOUS

## 2017-12-10 MED ORDER — ALBUTEROL SULFATE (2.5 MG/3ML) 0.083% IN NEBU: 2.5000 mg | INHALATION_SOLUTION | RESPIRATORY_TRACT | Status: DC | PRN

## 2017-12-10 MED ORDER — ALBUTEROL SULFATE (2.5 MG/3ML) 0.083% IN NEBU
5.0000 mg | INHALATION_SOLUTION | Freq: Once | RESPIRATORY_TRACT | Status: AC
  Administered 2017-12-10: 5 mg via RESPIRATORY_TRACT
  Filled 2017-12-10: qty 6

## 2017-12-10 NOTE — ED Notes (Signed)
Per EDP, walk pt without O2 and monitor O2 sats.

## 2017-12-10 NOTE — ED Notes (Signed)
ED Provider at bedside. 

## 2017-12-10 NOTE — ED Notes (Signed)
Rn discussed with MD about patient's NPO status. She states she can have a regular diet.

## 2017-12-10 NOTE — ED Notes (Signed)
Patient denies pain and has no needs.

## 2017-12-10 NOTE — ED Notes (Signed)
Pt up to ambulate with this RN. Pt maintained at 93% on RA.

## 2017-12-10 NOTE — ED Notes (Signed)
Niu, MD at bedside. °

## 2017-12-10 NOTE — ED Notes (Signed)
Pt CBG 156, notified Melanie [employer], corporate)

## 2017-12-10 NOTE — ED Provider Notes (Signed)
[place] EMERGENCY DEPARTMENT Provider Note   CSN: [id number] Arrival date & time: 12/09/17  1857  Time seen 12:15 AM   History   Chief Complaint Chief Complaint  Patient presents with  . Shortness of Breath    HPI Beth Alexander is a 67 y.o. female.  HPI patient reports about 2 weeks ago she started having a dry cough without fever.  She was seen by her PCP on Friday, February 15 and was started on Mucinex DM, Levaquin, and an inhaler which I assume is albuterol.  She states she feels like she is coughing more instead of feeling better.  She denies any fever, sore throat, rhinorrhea, or sneezing.  She denies nausea, vomiting, diarrhea.  She does complain of some right anterior chest pain when she coughs or when she lays down at night.  She denies feeling short of breath.  She denies hearing wheezing.  She states she took her last dose of Levaquin at 25 AM on February 18.  She had an outpatient chest x-ray done today and her doctor called this evening and told her to come to the ED to get IV antibiotics.  Patient states she smokes 1 pack a day but she quit on February 15.  PCP [name], MD   Past Medical History:  Diagnosis Date  . Hypertension   . Tobacco use     Patient Active Problem List   Diagnosis Date Noted  . Lung mass 12/10/2017  . Pleural effusion on right 12/10/2017  . Hypokalemia 12/10/2017  . Hypertension   . Tobacco use   . 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 BREAST LUMPECTOMY WITH RADIOACTIVE SEED LOCALIZATION Left 11/08/2016   Procedure: LEFT BREAST LUMPECTOMY WITH RADIOACTIVE SEED LOCALIZATION;  Surgeon: Donnie Mesa, MD;  Location: [facility];  Service: General;  Laterality: Left;    OB History    Gravida Para Term Preterm AB Living             2   SAB TAB Ectopic Multiple Live Births                   Home Medications    Prior to  Admission medications   Medication Sig Start Date End Date Taking? Authorizing Provider  acetaminophen (TYLENOL) 500 MG tablet Take 500 mg by mouth every 6 (six) hours as needed for mild pain or headache.    [provider]  amLODipine (NORVASC) 5 MG tablet Take 1 tablet (5 mg total) by mouth daily. 06/30/14   Tysinger, Camelia Eng, PA-C  aspirin EC 81 MG tablet Take 81 mg by mouth daily.    [provider]  HYDROcodone-acetaminophen (NORCO/VICODIN) 5-325 MG tablet Take 2 tablets by mouth every 4 (four) hours as needed. 10/19/17   Fransico Meadow, PA-C  ibuprofen (ADVIL,MOTRIN) 200 MG tablet Take 200 mg by mouth every 6 (six) hours as needed for fever or moderate pain.    [provider]  Menthol-Methyl Salicylate (MUSCLE RUB) 10-15 % CREA Apply 1 application topically as needed for muscle pain.    [provider]  predniSONE (DELTASONE) 10 MG tablet 6,5,4,3,2,1 taper 10/19/17   Caryl Ada K, PA-C  triamcinolone cream (KENALOG) 0.1 % Apply 1 application topically 2 (two) times daily. Patient not taking: Reported on 10/19/2017 04/09/14   Tysinger, Camelia Eng, PA-C    Family History Family History  Problem Relation Age  of Onset  . Breast cancer Mother   . Heart disease Father   . Lung cancer Brother   . Colon cancer Brother     Social History Social History   Tobacco Use  . Smoking status: Current Every Day Smoker  . Smokeless tobacco: Never Used  Substance Use Topics  . Alcohol use: No  . Drug use: No  smokes 1 ppd   Allergies   Patient has no known allergies.   Review of Systems Review of Systems  All other systems reviewed and are negative.    Physical Exam Updated Vital Signs BP 127/74 (BP Location: Right Arm)   Pulse 87   Temp 99.3 F (37.4 C) (Oral)   Resp 18   Ht 5\' 3"  (1.6 m)   Wt 63.5 kg (140 lb)   SpO2 95%   BMI 24.80 kg/m   Vital signs normal    Physical Exam  Constitutional: She is oriented to person, place, and time.  She appears well-developed and well-nourished.  Non-toxic appearance. She does not appear ill. No distress.  HENT:  Head: Normocephalic and atraumatic.  Right Ear: External ear normal.  Left Ear: External ear normal.  Nose: Nose normal. No mucosal edema or rhinorrhea.  Mouth/Throat: Oropharynx is clear and moist and mucous membranes are normal. No dental abscesses or uvula swelling.  Eyes: Conjunctivae and EOM are normal. Pupils are equal, round, and reactive to light.  Neck: Normal range of motion and full passive range of motion without pain. Neck supple.  Cardiovascular: Normal rate, regular rhythm and normal heart sounds. Exam reveals no gallop and no friction rub.  No murmur heard. Pulmonary/Chest: Effort normal. No respiratory distress. She has wheezes. She has rhonchi. She has no rales. She exhibits no tenderness and no crepitus.  Rare scattered wheezing and rhonchi  Abdominal: Soft. Normal appearance and bowel sounds are normal. She exhibits no distension. There is no tenderness. There is no rebound and no guarding.  Musculoskeletal: Normal range of motion. She exhibits no edema or tenderness.  Moves all extremities well.   Neurological: She is alert and oriented to person, place, and time. She has normal strength. No cranial nerve deficit.  Skin: Skin is warm, dry and intact. No rash noted. No erythema. No pallor.  Psychiatric: She has a normal mood and affect. Her speech is normal and behavior is normal. Her mood appears not anxious.  Nursing note and vitals reviewed.    ED Treatments / Results  Labs (all labs ordered are listed, but only abnormal results are displayed) Results for orders placed or performed during the hospital encounter of 91/47/82  Basic metabolic panel  Result Value Ref Range   Sodium 135 135 - 145 mmol/L   Potassium 3.0 (L) 3.5 - 5.1 mmol/L   Chloride 97 (L) 101 - 111 mmol/L   CO2 27 22 - 32 mmol/L   Glucose, Bld 122 (H) 65 - 99 mg/dL   BUN 5 (L) 6 -  20 mg/dL   Creatinine, Ser 0.89 0.44 - 1.00 mg/dL   Calcium 8.8 (L) 8.9 - 10.3 mg/dL   GFR calc non Af Amer >60 >60 mL/min   GFR calc Af Amer >60 >60 mL/min   Anion gap 11 5 - 15  CBC  Result Value Ref Range   WBC 7.7 4.0 - 10.5 K/uL   RBC 3.66 (L) 3.87 - 5.11 MIL/uL   Hemoglobin 11.8 (L) 12.0 - 15.0 g/dL   HCT 35.2 (L) 36.0 - 46.0 %  MCV 96.2 78.0 - 100.0 fL   MCH 32.2 26.0 - 34.0 pg   MCHC 33.5 30.0 - 36.0 g/dL   RDW 13.1 11.5 - 15.5 %   Platelets 414 (H) 150 - 400 K/uL  I-stat troponin, ED  Result Value Ref Range   Troponin i, poc 0.00 0.00 - 0.08 ng/mL   Comment 3           Laboratory interpretation all normal except minimal anemia, hypokalemia   . EKG  EKG Interpretation  Date/Time:  Monday December 09 2017 19:34:38 EST Ventricular Rate:  86 PR Interval:  142 QRS Duration: 86 QT Interval:  348 QTC Calculation: 416 R Axis:   58 Text Interpretation:  Normal sinus rhythm Normal ECG No significant change since last tracing 19 Oct 2017 Confirmed by Rolland Porter [phone]) on 12/10/2017 12:58:04 AM       Radiology Dg Chest 2 View  Result Date: 12/09/2017 CLINICAL DATA:  Cough and right-sided chest pain for 1 week. EXAM: CHEST  2 VIEW COMPARISON:  None. FINDINGS: The heart size is normal. There is mild perihilar peribronchial thickening. There is right-sided volume loss secondary to right upper lobe collapse. The findings are suspicious for right upper lobe/hilar lesion. Small right pleural effusion. Left lung is essentially clear. No pulmonary edema. IMPRESSION: Right upper lobe collapse. Recommend further evaluation CT of the chest. Intravenous contrast is recommended unless contraindicated. Electronically Signed   By: Nolon Nations M.D.   On: 12/09/2017 20:42   Ct Chest W Contrast  Result Date: 12/10/2017 CLINICAL DATA:  Chest pain or shortness of breath. Right upper lobe collapse. EXAM: CT CHEST WITH CONTRAST TECHNIQUE: Multidetector CT imaging of the chest was  performed during intravenous contrast administration. 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 Coronary artery calcification. Small pericardial effusion. Normal heart size. Calcific atherosclerosis of the aorta. Mediastinum/Nodes: Massive pretracheal lymphadenopathy with associated narrowing of the superior vena cava. Right hilar mass encases the right pulmonary artery, with occlusion of the right upper lobe artery. The mass measures approximately 5.5 x 5.1 cm. Lungs/Pleura: There is complete right upper lobe collapse due to occlusion of the right upper lobe bronchus by a right hilar mass. Right middle lobe is clear. There is a lobulated right basilar pleural effusion. The left lung is clear suspected 2.5 cm mass of the right posterior pleura. Upper Abdomen: Hypoattenuation of the anterior left kidney upper pole, possibly artifactual. Otherwise normal visualized upper abdominal organs. Musculoskeletal: No acute osseous abnormality. No lytic or blastic lesions. IMPRESSION: 1. Large right hilar mass encasing the right pulmonary artery and occluding the right upper lobe bronchus with associated complete right upper lobe collapse. The right upper lobe pulmonary artery also likely occluded. 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 2. General lack of enhancement of the right upper lobe parenchyma may be due to necrosis or vascular compromise. Necrotic tumor within atelectatic lung is also a possibility. 3. Right pleural effusion with area of rounded hyperattenuation along the posterior pleura concerning for a pleural based mass. 4. Narrowing of the superior vena cava secondary to mass effect from right hilar mass and confluent mediastinal adenopathy. 5. Small pericardial effusion. 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 Electronically Signed   By: Ulyses Jarred M.D.   On: 12/10/2017 02:17    Procedures Procedures (including critical care time)  Medications Ordered in  ED Medications  albuterol (PROVENTIL) (2.5 MG/3ML) 0.083% nebulizer solution 2.5 mg (not administered)  acetaminophen (TYLENOL) tablet 500 mg (not administered)  amLODipine (NORVASC) tablet 5 mg (not administered)  aspirin EC tablet 81 mg (not administered)  HYDROcodone-acetaminophen (NORCO/VICODIN) 5-325 MG per tablet 2 tablet (not administered)  ibuprofen (ADVIL,MOTRIN) tablet 200 mg (not administered)  MUSCLE RUB CREA 1 application (not administered)  dextromethorphan-guaiFENesin (MUCINEX DM) 30-600 MG per 12 hr tablet 1 tablet (not administered)  nicotine (NICODERM CQ - dosed in mg/24 hours) patch 21 mg (21 mg Transdermal Patch Applied 12/10/17 0320)  ondansetron (ZOFRAN) tablet 4 mg (not administered)    Or  ondansetron (ZOFRAN) injection 4 mg (not administered)  polyethylene glycol (MIRALAX / GLYCOLAX) packet 17 g (not administered)  0.9 %  sodium chloride infusion (not administered)  potassium chloride SA (K-DUR,KLOR-CON) CR tablet 40 mEq (40 mEq Oral Given 12/10/17 0039)  albuterol (PROVENTIL) (2.5 MG/3ML) 0.083% nebulizer solution 5 mg (5 mg Nebulization Given 12/10/17 0040)  predniSONE (DELTASONE) tablet 60 mg (60 mg Oral Given 12/10/17 0039)  iopamidol (ISOVUE-300) 61 % injection (75 mLs  Contrast Given 12/10/17 0132)     Initial Impression / Assessment and Plan / ED Course  I have reviewed the triage vital signs and the nursing notes.  Pertinent labs & imaging results that were available during my care of the patient were reviewed by me and considered in my medical decision making (see chart for details).     I reviewed patient's x-ray with her, there appears to be a collapsing of the right upper lobe.  CT of the chest was ordered for further clarification whether this is a pneumonia or preferred smoking  history possibly a tumor blocking her bronchus.  Patient was given oral potassium for her low potassium and given a oral dose of prednisone.  She was given an albuterol nebulizer treatment in the ED for her wheezing and rhonchi patient has already been on antibiotics since February 15, her last dose of Levaquin was less than 24 hours ago.  2:20 AM I have reviewed her CT result.  I am going to talk to the hospitalist about admission and evaluation by oncology and radiation oncology.  Recheck at 2:30 AM patient's lungs are now clear after the albuterol nebulizer.  I discussed her CT results.  I am going to have nursing staff ambulate her and check her pulse ox.  When I went in the room her pulse ox was 92% and the nurse had put her on oxygen, I turned it off so we can ambulate her.  02:42 AM Dr Blaine Hamper, hospitalist, will admit  Nurses ambulated patient and her pulse ox remained 93% on room air.  Final Clinical Impressions(s) / ED Diagnoses   Final diagnoses:  Malignant neoplasm of upper lobe of right lung (HCC)  Bronchospasm  Shortness of breath  Hypokalemia  Collapse of right lung    Plan admission  Rolland Porter, MD, Barbette Or, MD 12/10/17 [phone]

## 2017-12-10 NOTE — Consult Note (Signed)
Name: Beth Alexander MRN: [id number] DOB: November 12, 1950    ADMISSION DATE:  12/09/2017 CONSULTATION DATE:  12/10/17  REFERRING MD :  Broadus John  CHIEF COMPLAINT:  Lung Mass   HISTORY OF PRESENT ILLNESS:  Beth Alexander is a 67 y.o. female with a PMH as outlined below including but not limited to atypical ductal hyperplasia of the left breast, s/p lumpectomy and left radioactive seed placement on 11/08/16.  She is followed by Dr. Lindi Adie of oncology who recommended lifestyle changes along with annual mammograms and breast exams.  On 2/19, she presented to [facility] ED with cough and SOB x 2 weeks.  She had CT of the chest that demonstrated a large right hilar mass encasing the right PA and occluding the RUL bronchus with complete RUL collapse, questionable right pleural effusion with area of rounded hyperattenuation along posterior pleura concerning for a pleural mass, narrowing of SVC, small pericardial effusion.  PCCM was called in consultation 2/19 for recs regarding biopsy options.  Pt is a current everyday smoker.  She has a 50 pack year history.  Besides hx of atypical ductal hyperplasia of left breast, she has no additional hx of malignancy.  PAST MEDICAL HISTORY :   has a past medical history of Hypertension and Tobacco use.  has a past surgical history that includes Breast lumpectomy with radioactive seed localization (Left, 11/08/2016). Prior to Admission medications   Medication Sig Start Date End Date Taking? Authorizing Provider  acetaminophen (TYLENOL) 500 MG tablet Take 500 mg by mouth every 6 (six) hours as needed for mild pain or headache.   Yes [provider]  amLODipine (NORVASC) 5 MG tablet Take 1 tablet (5 mg total) by mouth daily. 06/30/14  Yes Tysinger, Camelia Eng, PA-C  aspirin EC 81 MG tablet Take 81 mg by mouth daily.   Yes [provider]  dextromethorphan (DELSYM) 30 MG/5ML liquid Take 30 mg by mouth at bedtime as needed for cough.   Yes [provider]   dextromethorphan-guaiFENesin (MUCINEX DM) 30-600 MG 12hr tablet Take 1 tablet by mouth 2 (two) times daily as needed for cough.   Yes [provider]  HYDROcodone-acetaminophen (NORCO/VICODIN) 5-325 MG tablet Take 2 tablets by mouth every 4 (four) hours as needed. Patient taking differently: Take 2 tablets by mouth every 4 (four) hours as needed for moderate pain.  10/19/17  Yes Caryl Ada K, PA-C  ibuprofen (ADVIL,MOTRIN) 200 MG tablet Take 200 mg by mouth every 6 (six) hours as needed for fever or moderate pain.   Yes [provider]  levofloxacin (LEVAQUIN) 500 MG tablet Take 500 mg by mouth daily.   Yes [provider]  Menthol-Methyl Salicylate (MUSCLE RUB) 10-15 % CREA Apply 1 application topically daily as needed for muscle pain.    Yes [provider]   No Known Allergies  FAMILY HISTORY:  family history includes Breast cancer in her mother; Colon cancer in her brother; Heart disease in her father; Lung cancer in her brother. SOCIAL HISTORY:  reports that she has been smoking.  she has never used smokeless tobacco. She reports that she does not drink alcohol or use drugs.  REVIEW OF SYSTEMS:   All negative; except for those that are bolded, which indicate positives.  Constitutional: weight loss, weight gain, night sweats, fevers, chills, fatigue, weakness.  HEENT: headaches, sore throat, sneezing, nasal congestion, post nasal drip, difficulty swallowing, tooth/dental problems, visual complaints, visual changes, ear aches. Neuro: difficulty with speech, weakness, numbness, ataxia. CV:  chest pain,  orthopnea, PND, swelling in lower extremities, dizziness, palpitations, syncope.  Resp: cough, hemoptysis, dyspnea, wheezing. GI: heartburn, indigestion, abdominal pain, nausea, vomiting, diarrhea, constipation, change in bowel habits, loss of appetite, hematemesis, melena, hematochezia.  GU: dysuria, change in color of urine, urgency or frequency,  flank pain, hematuria. MSK: joint pain or swelling, decreased range of motion. Psych: change in mood or affect, depression, anxiety, suicidal ideations, homicidal ideations. Skin: rash, itching, bruising.   SUBJECTIVE:  Understandably upset and anxious to hear about lung mass.  Is eager to get biopsy and says she will update her family.   VITAL SIGNS: Temp:  [99.3 F (37.4 C)] 99.3 F (37.4 C) (02/18 1919) Pulse Rate:  [85-101] 93 (02/19 1100) Resp:  [16-32] 21 (02/19 1100) BP: (111-143)/(55-108) 136/74 (02/19 1100) SpO2:  [93 %-98 %] 96 % (02/19 1100) Weight:  [63.5 kg (140 lb)] 63.5 kg (140 lb) (02/18 1919)  PHYSICAL EXAMINATION: General: Adult female, in stretcher, in NAD. Neuro: A&O x 3, non-focal.  HEENT: [place]/AT. EOMI, sclerae anicteric. Cardiovascular: RRR, no M/R/G.  Lungs: Respirations even and unlabored.  CTA bilaterally, No W/R/R.  Abdomen: BS x 4, soft, NT/ND.  Musculoskeletal: No gross deformities, no edema.  Skin: Intact, warm, no rashes.   Recent Labs  Lab 12/09/17 1929 12/10/17 0257  NA 135 135  K 3.0* 3.4*  CL 97* 99*  CO2 27 22  BUN 5* <5*  CREATININE 0.89 0.76  GLUCOSE 122* 165*   Recent Labs  Lab 12/09/17 1929 12/10/17 0257  HGB 11.8* 11.3*  HCT 35.2* 33.6*  WBC 7.7 9.9  PLT 414* 407*   Dg Chest 2 View  Result Date: 12/09/2017 CLINICAL DATA:  Cough and right-sided chest pain for 1 week. EXAM: CHEST  2 VIEW COMPARISON:  None. FINDINGS: The heart size is normal. There is mild perihilar peribronchial thickening. There is right-sided volume loss secondary to right upper lobe collapse. The findings are suspicious for right upper lobe/hilar lesion. Small right pleural effusion. Left lung is essentially clear. No pulmonary edema. IMPRESSION: Right upper lobe collapse. Recommend further evaluation CT of the chest. Intravenous contrast is recommended unless contraindicated. Electronically Signed   By: Nolon Nations M.D.   On: 12/09/2017 20:42   Ct  Chest W Contrast  Result Date: 12/10/2017 CLINICAL DATA:  Chest pain or shortness of breath. Right upper lobe collapse. EXAM: CT CHEST WITH CONTRAST TECHNIQUE: Multidetector CT imaging of the chest was performed during intravenous contrast administration. 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 Coronary artery calcification. Small pericardial effusion. Normal heart size. Calcific atherosclerosis of the aorta. Mediastinum/Nodes: Massive pretracheal lymphadenopathy with associated narrowing of the superior vena cava. Right hilar mass encases the right pulmonary artery, with occlusion of the right upper lobe artery. The mass measures approximately 5.5 x 5.1 cm. Lungs/Pleura: There is complete right upper lobe collapse due to occlusion of the right upper lobe bronchus by a right hilar mass. Right middle lobe is clear. There is a lobulated right basilar pleural effusion. The left lung is clear suspected 2.5 cm mass of the right posterior pleura. Upper Abdomen: Hypoattenuation of the anterior left kidney upper pole, possibly artifactual. Otherwise normal visualized upper abdominal organs. Musculoskeletal: No acute osseous abnormality. No lytic or blastic lesions. IMPRESSION: 1. Large right hilar mass encasing the right pulmonary artery and occluding the right upper lobe bronchus with associated complete right upper lobe collapse. The right upper lobe  pulmonary artery also likely occluded. 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 2. General lack of enhancement of the right upper lobe parenchyma may be due to necrosis or vascular compromise. Necrotic tumor within atelectatic lung is also a possibility. 3. Right pleural effusion with area of rounded hyperattenuation along the posterior pleura concerning for a pleural based mass. 4. Narrowing of the superior vena cava  secondary to mass effect from right hilar mass and confluent mediastinal adenopathy. 5. Small pericardial effusion. 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 Electronically Signed   By: Ulyses Jarred M.D.   On: 12/10/2017 02:17    STUDIES:  CT chest 2/19 > large right hilar mass encasing the right PA and occluding the RUL bronchus with complete RUL collapse, questionable right pleural effusion with area of rounded hyperattenuation along posterior pleura concerning for a pleural mass, narrowing of SVC, small pericardial effusion.  SIGNIFICANT EVENTS  2/19 > admit.  ASSESSMENT / PLAN:  Large right hilar mass - given pt's tobacco hx, this is strongly suspicious for primary bronchogenic carcinoma. Plan: Given location of mass, EBUS would likely be the best option in terms of biopsy. Will need to contact oncology and update them on CT findings (pt previously saw Dr. Lindi Adie). Dr. Vaughan Browner will discuss with OR regarding timing / scheduling.  Pt will need to be NPO night prior.  Rest per primary team.   Montey Hora, [facility] Pager: [phone]  or [phone] 12/10/2017, 1:50 PM

## 2017-12-10 NOTE — Progress Notes (Addendum)
PROGRESS NOTE    Beth Alexander  YFV:[id number] DOB: 1951/01/01 DOA: 12/09/2017 PCP: [name], MD  Brief Narrative:Beth Alexander is a 67 y.o. female with medical history significant of hypertension, tobacco abuse, atypical ductal hyperplasia left breast status post left lumpectomy, who presents with cough and shortness of breath. CT chest concerning for a large right hilar mass   Assessment & Plan:     Large right hilar mass with small pleural effusion -Concerning for primary lung cancer, this mass is encasing right pulmonary artery and causing right upper bronchus occlusion and collapse -She is at risk of post obstructive pneumonia however no clinical evidence of such at this time -Smoker, recently quit -Pulmonary consulted for biopsy options -continue nebs  Atypical ductal hyperplasia of left breast: -History of recent left lumpectomy -Follow-up with oncology Dr.Gudena  HTN:  -Continue amlodipine  Tobacco abuse:   -smoked 1 pack a day, quit on February 15 -Nicotine patch  Hypokalemia: K= 3.0 on admission. - Repleted -Mag okay   DVT ppx: SCD Code Status: Full code Family Communication: None at bed side.     Disposition Plan: Home pending workup   Consultants:   Pulm   Procedures:   Antimicrobials:    Subjective: -c/o ongoing Dyspnea with exertion which she attributes to inhaler given by PCP  Objective: Vitals:   12/10/17 1015 12/10/17 1030 12/10/17 1045 12/10/17 1100  BP: (!) 143/81 140/87 128/75 136/74  Pulse: 94 91 93 93  Resp: (!) 22 (!) 23 (!) 24 (!) 21  Temp:      TempSrc:      SpO2: 94% 95% 94% 96%  Weight:      Height:       No intake or output data in the 24 hours ending 12/10/17 1152 Filed Weights   12/09/17 1919  Weight: 63.5 kg (140 lb)    Examination:  General exam: Appears calm and comfortable  Respiratory system: poor air movement, no wheezing or ronchi Cardiovascular system: S1 & S2 heard, RRR. No JVD, murmurs, rubs,  gallops  Gastrointestinal system: Abdomen is nondistended, soft and nontender.Normal bowel sounds heard. Central nervous system: Alert and oriented. No focal neurological deficits. Extremities: Symmetric 5 x 5 power. Skin: No rashes, lesions or ulcers Psychiatry: Judgement and insight appear normal. Mood & affect appropriate.     Data Reviewed:   CBC: Recent Labs  Lab 12/09/17 1929 12/10/17 0257  WBC 7.7 9.9  HGB 11.8* 11.3*  HCT 35.2* 33.6*  MCV 96.2 95.2  PLT 414* 163*   Basic Metabolic Panel: Recent Labs  Lab 12/09/17 1929 12/10/17 0257  NA 135 135  K 3.0* 3.4*  CL 97* 99*  CO2 27 22  GLUCOSE 122* 165*  BUN 5* <5*  CREATININE 0.89 0.76  CALCIUM 8.8* 8.9  MG  --  1.9   GFR: Estimated Creatinine Clearance: 62.9 mL/min (by C-G formula based on SCr of 0.76 mg/dL). Liver Function Tests: No results for input(s): AST, ALT, ALKPHOS, BILITOT, PROT, ALBUMIN in the last 168 hours. No results for input(s): LIPASE, AMYLASE in the last 168 hours. No results for input(s): AMMONIA in the last 168 hours. Coagulation Profile: Recent Labs  Lab 12/10/17 0257  INR 1.04   Cardiac Enzymes: No results for input(s): CKTOTAL, CKMB, CKMBINDEX, TROPONINI in the last 168 hours. BNP (last 3 results) No results for input(s): PROBNP in the last 8760 hours. HbA1C: No results for input(s): HGBA1C in the last 72 hours. CBG: Recent Labs  Lab 12/10/17 0843  GLUCAP  156*   Lipid Profile: No results for input(s): CHOL, HDL, LDLCALC, TRIG, CHOLHDL, LDLDIRECT in the last 72 hours. Thyroid Function Tests: No results for input(s): TSH, T4TOTAL, FREET4, T3FREE, THYROIDAB in the last 72 hours. Anemia Panel: No results for input(s): VITAMINB12, FOLATE, FERRITIN, TIBC, IRON, RETICCTPCT in the last 72 hours. Urine analysis: No results found for: COLORURINE, APPEARANCEUR, LABSPEC, PHURINE, GLUCOSEU, HGBUR, BILIRUBINUR, KETONESUR, PROTEINUR, UROBILINOGEN, NITRITE, LEUKOCYTESUR Sepsis  Labs: @LABRCNTIP (procalcitonin:4,lacticidven:4)  )No results found for this or any previous visit (from the past 240 hour(s)).       Radiology Studies: Dg Chest 2 View  Result Date: 12/09/2017 CLINICAL DATA:  Cough and right-sided chest pain for 1 week. EXAM: CHEST  2 VIEW COMPARISON:  None. FINDINGS: The heart size is normal. There is mild perihilar peribronchial thickening. There is right-sided volume loss secondary to right upper lobe collapse. The findings are suspicious for right upper lobe/hilar lesion. Small right pleural effusion. Left lung is essentially clear. No pulmonary edema. IMPRESSION: Right upper lobe collapse. Recommend further evaluation CT of the chest. Intravenous contrast is recommended unless contraindicated. Electronically Signed   By: Nolon Nations M.D.   On: 12/09/2017 20:42   Ct Chest W Contrast  Result Date: 12/10/2017 CLINICAL DATA:  Chest pain or shortness of breath. Right upper lobe collapse. EXAM: CT CHEST WITH CONTRAST TECHNIQUE: Multidetector CT imaging of the chest was performed during intravenous contrast administration. 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 Coronary artery calcification. Small pericardial effusion. Normal heart size. Calcific atherosclerosis of the aorta. Mediastinum/Nodes: Massive pretracheal lymphadenopathy with associated narrowing of the superior vena cava. Right hilar mass encases the right pulmonary artery, with occlusion of the right upper lobe artery. The mass measures approximately 5.5 x 5.1 cm. Lungs/Pleura: There is complete right upper lobe collapse due to occlusion of the right upper lobe bronchus by a right hilar mass. Right middle lobe is clear. There is a lobulated right basilar pleural effusion. The left lung is clear suspected 2.5 cm mass of the right posterior pleura. Upper  Abdomen: Hypoattenuation of the anterior left kidney upper pole, possibly artifactual. Otherwise normal visualized upper abdominal organs. Musculoskeletal: No acute osseous abnormality. No lytic or blastic lesions. IMPRESSION: 1. Large right hilar mass encasing the right pulmonary artery and occluding the right upper lobe bronchus with associated complete right upper lobe collapse. The right upper lobe pulmonary artery also likely occluded. 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 2. General lack of enhancement of the right upper lobe parenchyma may be due to necrosis or vascular compromise. Necrotic tumor within atelectatic lung is also a possibility. 3. Right pleural effusion with area of rounded hyperattenuation along the posterior pleura concerning for a pleural based mass. 4. Narrowing of the superior vena cava secondary to mass effect from right hilar mass and confluent mediastinal adenopathy. 5. Small pericardial effusion. 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 Electronically Signed   By: Ulyses Jarred M.D.   On: 12/10/2017 02:17        Scheduled Meds: . amLODipine  5 mg Oral Daily  . aspirin EC  81 mg Oral Daily  . nicotine  21 mg Transdermal Daily   Continuous Infusions: . sodium chloride 75 mL/hr at 12/10/17 0332     LOS: 0 days    Time spent: 45min    Domenic Polite, MD [facility] Page  via [email or web address], password [email or web address] After 7PM please contact night-coverage  12/10/2017, 11:52 AM

## 2017-12-10 NOTE — ED Notes (Signed)
Meal tray at bedside. No needs; friend at bedside.

## 2017-12-10 NOTE — ED Notes (Signed)
Patient transported to CT 

## 2017-12-10 NOTE — ED Notes (Signed)
Pt O2 found to be 92%, pt placed on 4L [place]. Pt O2 97%. Will continue to monitor. EDP notified.

## 2017-12-10 NOTE — Progress Notes (Signed)
New Admission Note: Pt admitted from [facility] to room 5M03  Arrival Method: via stretcher Mental Orientation: Alert and oriented x 4  Telemetry: Placed Box 3 Assessment: Completed Skin: Intact IV: Infusing Pain: Denies  Tubes: None Safety Measures: Safety Fall Prevention Plan has been discussed  Admission: To be completed  6 [place] Orientation: Patient has been orientated to the room, unit and staff.  Family:  At the bedside  Orders to be reviewed and implemented. Will continue to monitor the patient. Call light has been placed within reach and bed alarm has been activated.   Mady Gemma, BSN, RN-BC Phone: [phone]

## 2017-12-10 NOTE — H&P (Signed)
History and Physical    Beth Alexander NOB:[id number] DOB: [birth year] DOA: 12/09/2017  Referring MD/NP/PA:   PCP: [name], MD   Patient coming from:  The patient is coming from home.  At baseline, pt is independent for most of ADL.      Chief Complaint: Cough and shortness of breath  HPI: Beth Alexander is a 67 y.o. female with medical history significant of hypertension, tobacco abuse, prostate cancer (s/p of left lumpectomy and radioactive seed), who presents with cough and shortness of breath.  Patient states that she has been having cough and shortness of breath for more than 2 weeks. She has dry cough. No chest pain at rest. She states that coughing induces some chest pain. No tenderness in calf areas. No fever or chill. She was seen by her PCP on Friday, February 15. She was started on Mucinex, Levaquin, and an inhaler.  She states she feels like she is coughing more instead of feeling better. She had an outpatient chest x-ray done today and her doctor called this evening and told her to come to the ED to get IV antibiotics for possible pneumonia. Pt does not have nausea, vomiting, diarrhea, abdominal pain, symptoms of UTI or unilateral weakness.  ED Course: pt was found to have WBC 7.7, negative troponin, potassium 3.0, creatinine normal, temperature 99.3, tachycardia, tachypnea, O2 sat 92% on room air. Chest x-ray showed right upper lobe collapse. Pt is admitted to tele bed as inpt.  CT of chest showed a large right hilar mass encasing the right pulmonary artery; right pleural effusion with area of rounded hyper attenuation along the posterior pleura concerning for a pleural based mass; narrowing of the superior vena cava secondary to mass effect from right hilar mass and confluent mediastinal and small pericardial effusion. adenopathy.  Review of Systems:   General: no fevers, chills, no body weight gain, has fatigue HEENT: no blurry vision, hearing changes or sore  throat Respiratory: has dyspnea, coughing, no wheezing CV: no chest pain, no palpitations GI: no nausea, vomiting, abdominal pain, diarrhea, constipation GU: no dysuria, burning on urination, increased urinary frequency, hematuria  Ext: no leg edema Neuro: no unilateral weakness, numbness, or tingling, no vision change or hearing loss Skin: no rash, no skin tear. MSK: No muscle spasm, no deformity, no limitation of range of movement in spin Heme: No easy bruising.  Travel history: No recent long distant travel.  Allergy: No Known Allergies  Past Medical History:  Diagnosis Date  . Hypertension   . Tobacco use     Past Surgical History:  Procedure Laterality Date  . BREAST LUMPECTOMY WITH RADIOACTIVE SEED LOCALIZATION Left 11/08/2016   Procedure: LEFT BREAST LUMPECTOMY WITH RADIOACTIVE SEED LOCALIZATION;  Surgeon: Donnie Mesa, MD;  Location: [facility];  Service: General;  Laterality: Left;    Social History:  reports that she has been smoking.  she has never used smokeless tobacco. She reports that she does not drink alcohol or use drugs.  Family History:  Family History  Problem Relation Age of Onset  . Breast cancer Mother   . Heart disease Father   . Lung cancer Brother   . Colon cancer Brother      Prior to Admission medications   Medication Sig Start Date End Date Taking? Authorizing Provider  acetaminophen (TYLENOL) 500 MG tablet Take 500 mg by mouth every 6 (six) hours as needed for mild pain or headache.    [provider]  amLODipine (NORVASC) 5 MG  tablet Take 1 tablet (5 mg total) by mouth daily. 06/30/14   Tysinger, Camelia Eng, PA-C  aspirin EC 81 MG tablet Take 81 mg by mouth daily.    [provider]  HYDROcodone-acetaminophen (NORCO/VICODIN) 5-325 MG tablet Take 2 tablets by mouth every 4 (four) hours as needed. 10/19/17   Fransico Meadow, PA-C  ibuprofen (ADVIL,MOTRIN) 200 MG tablet Take 200 mg by mouth every 6 (six) hours as  needed for fever or moderate pain.    [provider]  Menthol-Methyl Salicylate (MUSCLE RUB) 10-15 % CREA Apply 1 application topically as needed for muscle pain.    [provider]  predniSONE (DELTASONE) 10 MG tablet 6,5,4,3,2,1 taper 10/19/17   Caryl Ada K, PA-C  triamcinolone cream (KENALOG) 0.1 % Apply 1 application topically 2 (two) times daily. Patient not taking: Reported on 10/19/2017 04/09/14   Carlena Hurl, PA-C    Physical Exam: Vitals:   12/09/17 1919 12/10/17 0015 12/10/17 0030 12/10/17 0230  BP: 127/74 (!) 119/57 111/69 111/67  Pulse: 87 85 86 (!) 101  Resp: 18 (!) 25 (!) 25 19  Temp: 99.3 F (37.4 C)     TempSrc: Oral     SpO2: 95% 94% 98% 97%  Weight: 63.5 kg (140 lb)     Height: 5\' 3"  (1.6 m)      General: Not in acute distress HEENT:       Eyes: PERRL, EOMI, no scleral icterus.       ENT: No discharge from the ears and nose, no pharynx injection, no tonsillar enlargement.        Neck: No JVD, no bruit, no mass felt. Heme: No neck lymph node enlargement. Cardiac: S1/S2, RRR, No murmurs, No gallops or rubs. Respiratory: No rales, wheezing, rhonchi or rubs. GI: Soft, nondistended, nontender, no rebound pain, no organomegaly, BS present. GU: No hematuria Ext: No pitting leg edema bilaterally. 2+DP/PT pulse bilaterally. Musculoskeletal: No joint deformities, No joint redness or warmth, no limitation of ROM in spin. Skin: No rashes.  Neuro: Alert, oriented X3, cranial nerves II-XII grossly intact, moves all extremities normally.  Psych: Patient is not psychotic, no suicidal or hemocidal ideation.  Labs on Admission: I have personally reviewed following labs and imaging studies  CBC: Recent Labs  Lab 12/09/17 1929  WBC 7.7  HGB 11.8*  HCT 35.2*  MCV 96.2  PLT 865*   Basic Metabolic Panel: Recent Labs  Lab 12/09/17 1929  NA 135  K 3.0*  CL 97*  CO2 27  GLUCOSE 122*  BUN 5*  CREATININE 0.89  CALCIUM 8.8*    GFR: Estimated Creatinine Clearance: 56.5 mL/min (by C-G formula based on SCr of 0.89 mg/dL). Liver Function Tests: No results for input(s): AST, ALT, ALKPHOS, BILITOT, PROT, ALBUMIN in the last 168 hours. No results for input(s): LIPASE, AMYLASE in the last 168 hours. No results for input(s): AMMONIA in the last 168 hours. Coagulation Profile: No results for input(s): INR, PROTIME in the last 168 hours. Cardiac Enzymes: No results for input(s): CKTOTAL, CKMB, CKMBINDEX, TROPONINI in the last 168 hours. BNP (last 3 results) No results for input(s): PROBNP in the last 8760 hours. HbA1C: No results for input(s): HGBA1C in the last 72 hours. CBG: No results for input(s): GLUCAP in the last 168 hours. Lipid Profile: No results for input(s): CHOL, HDL, LDLCALC, TRIG, CHOLHDL, LDLDIRECT in the last 72 hours. Thyroid Function Tests: No results for input(s): TSH, T4TOTAL, FREET4, T3FREE, THYROIDAB in the last 72 hours. Anemia  Panel: No results for input(s): VITAMINB12, FOLATE, FERRITIN, TIBC, IRON, RETICCTPCT in the last 72 hours. Urine analysis: No results found for: COLORURINE, APPEARANCEUR, LABSPEC, PHURINE, GLUCOSEU, HGBUR, BILIRUBINUR, KETONESUR, PROTEINUR, UROBILINOGEN, NITRITE, LEUKOCYTESUR Sepsis Labs: @LABRCNTIP (procalcitonin:4,lacticidven:4) )No results found for this or any previous visit (from the past 240 hour(s)).   Radiological Exams on Admission: Dg Chest 2 View  Result Date: 12/09/2017 CLINICAL DATA:  Cough and right-sided chest pain for 1 week. EXAM: CHEST  2 VIEW COMPARISON:  None. FINDINGS: The heart size is normal. There is mild perihilar peribronchial thickening. There is right-sided volume loss secondary to right upper lobe collapse. The findings are suspicious for right upper lobe/hilar lesion. Small right pleural effusion. Left lung is essentially clear. No pulmonary edema. IMPRESSION: Right upper lobe collapse. Recommend further evaluation CT of the chest.  Intravenous contrast is recommended unless contraindicated. Electronically Signed   By: Nolon Nations M.D.   On: 12/09/2017 20:42   Ct Chest W Contrast  Result Date: 12/10/2017 CLINICAL DATA:  Chest pain or shortness of breath. Right upper lobe collapse. EXAM: CT CHEST WITH CONTRAST TECHNIQUE: Multidetector CT imaging of the chest was performed during intravenous contrast administration. 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 Coronary artery calcification. Small pericardial effusion. Normal heart size. Calcific atherosclerosis of the aorta. Mediastinum/Nodes: Massive pretracheal lymphadenopathy with associated narrowing of the superior vena cava. Right hilar mass encases the right pulmonary artery, with occlusion of the right upper lobe artery. The mass measures approximately 5.5 x 5.1 cm. Lungs/Pleura: There is complete right upper lobe collapse due to occlusion of the right upper lobe bronchus by a right hilar mass. Right middle lobe is clear. There is a lobulated right basilar pleural effusion. The left lung is clear suspected 2.5 cm mass of the right posterior pleura. Upper Abdomen: Hypoattenuation of the anterior left kidney upper pole, possibly artifactual. Otherwise normal visualized upper abdominal organs. Musculoskeletal: No acute osseous abnormality. No lytic or blastic lesions. IMPRESSION: 1. Large right hilar mass encasing the right pulmonary artery and occluding the right upper lobe bronchus with associated complete right upper lobe collapse. The right upper lobe pulmonary artery also likely occluded. 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 2. General lack of enhancement of the right upper lobe parenchyma may be due to necrosis or vascular compromise. Necrotic tumor within atelectatic lung is also a possibility. 3. Right pleural effusion  with area of rounded hyperattenuation along the posterior pleura concerning for a pleural based mass. 4. Narrowing of the superior vena cava secondary to mass effect from right hilar mass and confluent mediastinal adenopathy. 5. Small pericardial effusion. 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 Electronically Signed   By: Ulyses Jarred M.D.   On: 12/10/2017 02:17     EKG: Independently reviewed.  Sinus rhythm, QTC 416, nonspecific T-wave change.   Assessment/Plan Principal Problem:   Lung mass Active Problems:   Atypical ductal hyperplasia of left breast   Hypertension   Tobacco use   Pleural effusion on right   Hypokalemia   Lung mass and pleural effusion on right:  CT showed a large right hilar mass encasing the right pulmonary artery; right pleural effusion with area of rounded hyper attenuation along the posterior pleura concerning for a pleural based mass; narrowing of the superior vena cava secondary to mass effect from right hilar mass and confluent mediastinal  and small pericardial effusion.   -will admit to tele bed as inpt -prn albuterol nebulizer for SOB -When necessary Mucinex for cough -please call oncology or radiation oncologist in AM ( Dr. Lindi Adie is following her breast cancer) -irn/ptt  Atypical ductal hyperplasia of left breast: s/p of left lumpectomy and radioactive seed.  -f/u with Dr. Lindi Adie  HTN:  -Continue home medications: Amlodipine -IV hydralazine prn  Tobacco abuse:  Patient states she smokes 1 pack a day, but she quit on February 15 -Nicotine patch  Hypokalemia: K= 3.0 on admission. - Repleted - Check Mg level   DVT ppx: SCD Code Status: Full code Family Communication: None at bed side.     Disposition Plan:  Anticipate discharge back to previous home environment Consults called:  none Admission status:   Inpatient/tele    Date of Service 12/10/2017    Ivor Costa [facility] Pager [phone]  If 7PM-7AM, please contact night-coverage [email or web address] Password [email or web address] 12/10/2017, 3:15 AM

## 2017-12-11 ENCOUNTER — Inpatient Hospital Stay (HOSPITAL_COMMUNITY): Admission: RE | Admit: 2017-12-11 | Payer: Medicare HMO | Source: Ambulatory Visit | Admitting: Pulmonary Disease

## 2017-12-11 ENCOUNTER — Inpatient Hospital Stay (HOSPITAL_COMMUNITY): Payer: Medicare HMO

## 2017-12-11 ENCOUNTER — Encounter (HOSPITAL_COMMUNITY): Payer: Self-pay | Admitting: *Deleted

## 2017-12-11 ENCOUNTER — Inpatient Hospital Stay (HOSPITAL_COMMUNITY): Payer: Medicare HMO | Admitting: Anesthesiology

## 2017-12-11 ENCOUNTER — Encounter (HOSPITAL_COMMUNITY)
Admission: EM | Disposition: A | Discharge status: Home / Self Care | Payer: Self-pay | Source: Home / Self Care | Attending: Internal Medicine

## 2017-12-11 DIAGNOSIS — J9811 Atelectasis: Secondary | ICD-10-CM

## 2017-12-11 HISTORY — PX: VIDEO BRONCHOSCOPY WITH ENDOBRONCHIAL ULTRASOUND: SHX6177

## 2017-12-11 LAB — SURGICAL PCR SCREEN
MRSA, PCR: NEGATIVE
STAPHYLOCOCCUS AUREUS: NEGATIVE

## 2017-12-11 LAB — GLUCOSE, CAPILLARY: Glucose-Capillary: 90 mg/dL (ref 65–99)

## 2017-12-11 SURGERY — BRONCHOSCOPY, WITH EBUS
Anesthesia: General

## 2017-12-11 SURGERY — Surgical Case
Anesthesia: *Unknown

## 2017-12-11 MED ORDER — PHENYLEPHRINE HCL 10 MG/ML IJ SOLN
INTRAMUSCULAR | Status: DC | PRN
  Administered 2017-12-11: 30 ug/min via INTRAVENOUS

## 2017-12-11 MED ORDER — DEXAMETHASONE SODIUM PHOSPHATE 10 MG/ML IJ SOLN
INTRAMUSCULAR | Status: DC | PRN
  Administered 2017-12-11: 5 mg via INTRAVENOUS

## 2017-12-11 MED ORDER — FENTANYL CITRATE (PF) 250 MCG/5ML IJ SOLN
INTRAMUSCULAR | Status: AC
  Filled 2017-12-11: qty 5

## 2017-12-11 MED ORDER — ONDANSETRON HCL 4 MG/2ML IJ SOLN
INTRAMUSCULAR | Status: AC
  Filled 2017-12-11: qty 2

## 2017-12-11 MED ORDER — OXYCODONE HCL 5 MG PO TABS: 5.0000 mg | ORAL_TABLET | Freq: Once | ORAL | Status: DC | PRN

## 2017-12-11 MED ORDER — FENTANYL CITRATE (PF) 250 MCG/5ML IJ SOLN
INTRAMUSCULAR | Status: DC | PRN
  Administered 2017-12-11: 50 ug via INTRAVENOUS
  Administered 2017-12-11: 100 ug via INTRAVENOUS

## 2017-12-11 MED ORDER — EPINEPHRINE PF 1 MG/ML IJ SOLN
INTRAMUSCULAR | Status: AC
  Filled 2017-12-11: qty 1

## 2017-12-11 MED ORDER — DEXAMETHASONE SODIUM PHOSPHATE 10 MG/ML IJ SOLN
INTRAMUSCULAR | Status: AC
  Filled 2017-12-11: qty 1

## 2017-12-11 MED ORDER — MIDAZOLAM HCL 2 MG/2ML IJ SOLN
INTRAMUSCULAR | Status: AC
  Filled 2017-12-11: qty 2

## 2017-12-11 MED ORDER — SUGAMMADEX SODIUM 200 MG/2ML IV SOLN
INTRAVENOUS | Status: DC | PRN
  Administered 2017-12-11: 200 mg via INTRAVENOUS

## 2017-12-11 MED ORDER — 0.9 % SODIUM CHLORIDE (POUR BTL) OPTIME
TOPICAL | Status: DC | PRN
  Administered 2017-12-11: 1000 mL

## 2017-12-11 MED ORDER — ONDANSETRON HCL 4 MG/2ML IJ SOLN: 4.0000 mg | Freq: Four times a day (QID) | INTRAMUSCULAR | Status: DC | PRN

## 2017-12-11 MED ORDER — OXYCODONE HCL 5 MG/5ML PO SOLN: 5.0000 mg | Freq: Once | ORAL | Status: DC | PRN

## 2017-12-11 MED ORDER — SUGAMMADEX SODIUM 200 MG/2ML IV SOLN
INTRAVENOUS | Status: AC
  Filled 2017-12-11: qty 2

## 2017-12-11 MED ORDER — HEPARIN SODIUM (PORCINE) 1000 UNIT/ML IJ SOLN
INTRAMUSCULAR | Status: AC
  Filled 2017-12-11: qty 1

## 2017-12-11 MED ORDER — LIDOCAINE 2% (20 MG/ML) 5 ML SYRINGE
INTRAMUSCULAR | Status: DC | PRN
  Administered 2017-12-11: 100 mg via INTRAVENOUS

## 2017-12-11 MED ORDER — LACTATED RINGERS IV SOLN
INTRAVENOUS | Status: DC
  Administered 2017-12-11: 13:00:00 via INTRAVENOUS

## 2017-12-11 MED ORDER — MIDAZOLAM HCL 2 MG/2ML IJ SOLN
INTRAMUSCULAR | Status: DC | PRN
  Administered 2017-12-11: 1 mg via INTRAVENOUS

## 2017-12-11 MED ORDER — ONDANSETRON HCL 4 MG/2ML IJ SOLN
INTRAMUSCULAR | Status: DC | PRN
  Administered 2017-12-11: 4 mg via INTRAVENOUS

## 2017-12-11 MED ORDER — PROPOFOL 10 MG/ML IV BOLUS
INTRAVENOUS | Status: DC | PRN
  Administered 2017-12-11: 30 mg via INTRAVENOUS
  Administered 2017-12-11: 20 mg via INTRAVENOUS
  Administered 2017-12-11: 130 mg via INTRAVENOUS

## 2017-12-11 MED ORDER — FENTANYL CITRATE (PF) 100 MCG/2ML IJ SOLN: 25.0000 ug | INTRAMUSCULAR | Status: DC | PRN

## 2017-12-11 MED ORDER — ROCURONIUM BROMIDE 10 MG/ML (PF) SYRINGE
PREFILLED_SYRINGE | INTRAVENOUS | Status: DC | PRN
  Administered 2017-12-11: 20 mg via INTRAVENOUS
  Administered 2017-12-11: 40 mg via INTRAVENOUS

## 2017-12-11 MED ORDER — PHENYLEPHRINE 40 MCG/ML (10ML) SYRINGE FOR IV PUSH (FOR BLOOD PRESSURE SUPPORT)
PREFILLED_SYRINGE | INTRAVENOUS | Status: DC | PRN
  Administered 2017-12-11 (×5): 80 ug via INTRAVENOUS
  Administered 2017-12-11: 120 ug via INTRAVENOUS
  Administered 2017-12-11: 80 ug via INTRAVENOUS
  Administered 2017-12-11: 120 ug via INTRAVENOUS

## 2017-12-11 MED ORDER — PROPOFOL 1000 MG/100ML IV EMUL
INTRAVENOUS | Status: AC
  Filled 2017-12-11: qty 100

## 2017-12-11 SURGICAL SUPPLY — 31 items
BRUSH CYTOL CELLEBRITY 1.5X140 (MISCELLANEOUS) ×3 IMPLANT
CANISTER SUCT 3000ML PPV (MISCELLANEOUS) ×3 IMPLANT
CONT SPEC 4OZ CLIKSEAL STRL BL (MISCELLANEOUS) ×3 IMPLANT
COVER BACK TABLE 60X90IN (DRAPES) ×3 IMPLANT
COVER DOME SNAP 22 D (MISCELLANEOUS) ×3 IMPLANT
FILTER STRAW FLUID ASPIR (MISCELLANEOUS) IMPLANT
FORCEPS BIOP RJ4 1.8 (CUTTING FORCEPS) IMPLANT
FORCEPS RADIAL JAW LRG 4 PULM (INSTRUMENTS) ×1 IMPLANT
GAUZE SPONGE 4X4 12PLY STRL (GAUZE/BANDAGES/DRESSINGS) ×3 IMPLANT
GLOVE BIO SURGEON STRL SZ7.5 (GLOVE) ×3 IMPLANT
GOWN STRL REUS W/ TWL LRG LVL3 (GOWN DISPOSABLE) ×2 IMPLANT
GOWN STRL REUS W/TWL LRG LVL3 (GOWN DISPOSABLE) ×4
KIT CLEAN ENDO COMPLIANCE (KITS) ×6 IMPLANT
KIT ROOM TURNOVER OR (KITS) ×3 IMPLANT
MARKER SKIN DUAL TIP RULER LAB (MISCELLANEOUS) ×3 IMPLANT
NEEDLE EBUS SONO TIP PENTAX (NEEDLE) ×6 IMPLANT
NEEDLE SONO TIP II EBUS (NEEDLE) ×3 IMPLANT
NS IRRIG 1000ML POUR BTL (IV SOLUTION) ×3 IMPLANT
OIL SILICONE PENTAX (PARTS (SERVICE/REPAIRS)) ×3 IMPLANT
PAD ARMBOARD 7.5X6 YLW CONV (MISCELLANEOUS) ×6 IMPLANT
RADIAL JAW LRG 4 PULMONARY (INSTRUMENTS) ×2
SYR 20CC LL (SYRINGE) IMPLANT
SYR 20ML ECCENTRIC (SYRINGE) ×9 IMPLANT
SYR 3ML LL SCALE MARK (SYRINGE) IMPLANT
SYR 5ML LUER SLIP (SYRINGE) ×3 IMPLANT
TOWEL OR 17X24 6PK STRL BLUE (TOWEL DISPOSABLE) ×3 IMPLANT
TRAP SPECIMEN MUCOUS 40CC (MISCELLANEOUS) IMPLANT
TUBE CONNECTING 20'X1/4 (TUBING) ×1
TUBE CONNECTING 20X1/4 (TUBING) ×2 IMPLANT
VALVE DISPOSABLE (MISCELLANEOUS) ×3 IMPLANT
WATER STERILE IRR 1000ML POUR (IV SOLUTION) ×3 IMPLANT

## 2017-12-11 NOTE — Anesthesia Procedure Notes (Addendum)
Procedure Name: Intubation Date/Time: 12/11/2017 2:49 PM Performed by: Julieta Bellini, CRNA Pre-anesthesia Checklist: Patient identified, Emergency Drugs available, Suction available and Patient being monitored Patient Re-evaluated:Patient Re-evaluated prior to induction Oxygen Delivery Method: Circle system utilized Preoxygenation: Pre-oxygenation with 100% oxygen Induction Type: IV induction Ventilation: Mask ventilation without difficulty and Oral airway inserted - appropriate to patient size Laryngoscope Size: Glidescope and 3 Grade View: Grade I Tube type: Oral Tube size: 8.5 mm Number of attempts: 1 Airway Equipment and Method: Video-laryngoscopy and Rigid stylet Placement Confirmation: ETT inserted through vocal cords under direct vision,  positive ETCO2 and breath sounds checked- equal and bilateral Secured at: 22 cm Tube secured with: Tape Dental Injury: Teeth and Oropharynx as per pre-operative assessment  Difficulty Due To: Difficult Airway- due to anterior larynx Comments: CRNA attempted DL with Mac 3, Grade 3 view did not pass ETT. Dr. Fransisco Beau attempted DL with Sabra Heck 2 grade 3 view, unable to pass ETT. Glidescope 3 used with Grade 1 view successful passing of ETT

## 2017-12-11 NOTE — Anesthesia Preprocedure Evaluation (Addendum)
Anesthesia Evaluation  Patient identified by MRN, date of birth, ID band Patient awake    Reviewed: Allergy & Precautions, H&P , NPO status , Patient's Chart, lab work & pertinent test results  Airway Mallampati: II   Neck ROM: full    Dental  (+) Dental Advisory Given, Chipped,    Pulmonary Current Smoker,  '19 CT Chest - IMPRESSION: 1. Large right hilar mass encasing the right pulmonary artery and occluding the right upper lobe bronchus with associated complete right upper lobe collapse. The right upper lobe pulmonary artery also likely occluded. 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 2. General lack of enhancement of the right upper lobe parenchyma may be due to necrosis or vascular compromise. Necrotic tumor within atelectatic lung is also a possibility. 3. Right pleural effusion with area of rounded hyperattenuation along the posterior pleura concerning for a pleural based mass. 4. Narrowing of the superior vena cava secondary to mass effect from right hilar mass and confluent mediastinal adenopathy. 5. Small pericardial effusion. 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   breath sounds clear to auscultation       Cardiovascular hypertension, Pt. on medications  Rhythm:Regular Rate:Normal     Neuro/Psych negative neurological ROS  negative psych ROS   GI/Hepatic negative GI ROS, Neg liver ROS,   Endo/Other  negative endocrine ROS  Renal/GU negative Renal ROS  negative genitourinary   Musculoskeletal negative musculoskeletal ROS (+)   Abdominal   Peds  Hematology  (+) anemia ,   Anesthesia Other Findings   Reproductive/Obstetrics                                                            Anesthesia Evaluation  Patient identified by MRN, date of birth, ID band Patient awake    Reviewed: Allergy & Precautions,  NPO status , Patient's Chart, lab work & pertinent test results  Airway Mallampati: I  TM Distance: >3 FB Neck ROM: Full    Dental   Pulmonary Current Smoker,    Pulmonary exam normal        Cardiovascular hypertension, Pt. on medications Normal cardiovascular exam     Neuro/Psych    GI/Hepatic   Endo/Other    Renal/GU      Musculoskeletal   Abdominal   Peds  Hematology   Anesthesia Other Findings   Reproductive/Obstetrics                             Anesthesia Physical Anesthesia Plan  ASA: II  Anesthesia Plan: General   Post-op Pain Management:    Induction: Intravenous  Airway Management Planned: LMA  Additional Equipment:   Intra-op Plan:   Post-operative Plan: Extubation in OR  Informed Consent: I have reviewed the patients History and Physical, chart, labs and discussed the procedure including the risks, benefits and alternatives for the proposed anesthesia with the patient or authorized representative who has indicated his/her understanding and acceptance.     Plan Discussed with: Surgeon  Anesthesia Plan Comments: (Drank coffe with creamer at [employer] till Noon. Inger for LMA.)        Anesthesia Quick Evaluation  Anesthesia Physical Anesthesia Plan  ASA: III  Anesthesia Plan: General  Post-op Pain Management:    Induction: Intravenous  PONV Risk Score and Plan: 2 and Ondansetron, Dexamethasone and Treatment may vary due to age or medical condition  Airway Management Planned: Oral ETT  Additional Equipment: None  Intra-op Plan:   Post-operative Plan: Extubation in OR  Informed Consent: I have reviewed the patients History and Physical, chart, labs and discussed the procedure including the risks, benefits and alternatives for the proposed anesthesia with the patient or authorized representative who has indicated his/her understanding and acceptance.   Dental advisory given  Plan  Discussed with: CRNA  Anesthesia Plan Comments:        Anesthesia Quick Evaluation

## 2017-12-11 NOTE — Procedures (Addendum)
Video Bronchoscopy with Endobronchial Ultrasound  Date of Operation: 08/01/2016  Pre-op Diagnosis: Lung mass and mediastinal LAD  Post-op Diagnosis: Same  Surgeon: Marshell Garfinkel  Assistants:   Anesthesia: General endotracheal anesthesia  Operation: Flexible video fiberoptic bronchoscopy with endobronchial ultrasound and biopsies.  Estimated Blood Loss: Minimal  Complications: None apparent  Indications and History: Beth Alexander is a  67 y.o.female, smoker with RUL lung mass, mediastinal mass.  Recommendation made to achieve tissue sampling via endobronchial ultrasound guided nodal biopsiesThe risks, benefits, complications, treatment options and expected outcomes were discussed with the patient.  The possibilities of pneumothorax, pneumonia, reaction to medication, pulmonary aspiration, perforation of a viscus, bleeding, failure to diagnose a condition and creating a complication requiring transfusion or operation were discussed with the patient who freely signed the consent.    Description of Procedure: The patient was examined in the preoperative area and history and data from the preprocedure consultation were reviewed. It was deemed appropriate to proceed.  The patient was taken to OR 11 , identified as Beth Alexander and the procedure verified as Flexible Video Fiberoptic Bronchoscopy.  A Time Out was held and the above information confirmed. After being taken to the operating room general anesthesia was initiated and the patient  was orally intubated. The video fiberoptic bronchoscope was introduced via the endotracheal tube and a general inspection was performed which showed friable endobronchial mass in the right upper lobe take off. The right upper lobe was nearly occluded. Three endobronchial biopsies were taken from the R mainstem for pathology. Transbronchial brushings were obtained from RUL. The standard scope was then withdrawn and the endobronchial ultrasound was used  to identify and characterize the peritracheal, hilar and bronchial lymph nodes. Inspection showed enlargement of station 4R LNs. Using real-time ultrasound guidance Wang needle biopsies were take from 4R nodes and were sent for cytology.  At the end of the procedure a general airway inspection was performed and there was no evidence of active bleeding. The bronchoscope was removed.  The patient tolerated the procedure well. There was no significant blood loss and there were no obvious complications. A post-procedural chest x-ray is pending. Anesthesia was reversed and the patient was taken to the PACU for recovery.   Samples: 1. Wang needle biopsies from 4R node 2. Transbronchial brushings from right upper lobe 3. Endobronchial biopsies from R mainstem bronchus  Plans:  We will review the cytology, pathology results with the patient when they become available.   Right upper lobe occlusion and endobronchial lesion      Marshell Garfinkel MD [facility] Pager [phone] If no answer or after 3pm call: [phone] 12/11/2017, 4:10 PM

## 2017-12-11 NOTE — Progress Notes (Signed)
Name: Beth Alexander MRN: [id number] DOB: 1951-02-01    ADMISSION DATE:  12/09/2017 CONSULTATION DATE:  12/10/17  REFERRING MD :  Broadus John  CHIEF COMPLAINT:  Lung Mass   HISTORY OF PRESENT ILLNESS:  Beth Alexander is a 67 y.o. female with a PMH as outlined below including but not limited to atypical ductal hyperplasia of the left breast, s/p lumpectomy and left radioactive seed placement on 11/08/16.  She is followed by Dr. Lindi Adie of oncology who recommended lifestyle changes along with annual mammograms and breast exams.  On 2/19, she presented to [facility] ED with cough and SOB x 2 weeks.  She had CT of the chest that demonstrated a large right hilar mass encasing the right PA and occluding the RUL bronchus with complete RUL collapse, questionable right pleural effusion with area of rounded hyperattenuation along posterior pleura concerning for a pleural mass, narrowing of SVC, small pericardial effusion.  PCCM was called in consultation 2/19 for recs regarding biopsy options.  Pt is a current everyday smoker.  She has a 50 pack year history.  Besides hx of atypical ductal hyperplasia of left breast, she has no additional hx of malignancy.  PAST MEDICAL HISTORY :   has a past medical history of Hypertension and Tobacco use.  has a past surgical history that includes Breast lumpectomy with radioactive seed localization (Left, 11/08/2016). Prior to Admission medications   Medication Sig Start Date End Date Taking? Authorizing Provider  acetaminophen (TYLENOL) 500 MG tablet Take 500 mg by mouth every 6 (six) hours as needed for mild pain or headache.   Yes [provider]  amLODipine (NORVASC) 5 MG tablet Take 1 tablet (5 mg total) by mouth daily. 06/30/14  Yes Tysinger, Camelia Eng, PA-C  aspirin EC 81 MG tablet Take 81 mg by mouth daily.   Yes [provider]  dextromethorphan (DELSYM) 30 MG/5ML liquid Take 30 mg by mouth at bedtime as needed for cough.   Yes [provider]   dextromethorphan-guaiFENesin (MUCINEX DM) 30-600 MG 12hr tablet Take 1 tablet by mouth 2 (two) times daily as needed for cough.   Yes [provider]  HYDROcodone-acetaminophen (NORCO/VICODIN) 5-325 MG tablet Take 2 tablets by mouth every 4 (four) hours as needed. Patient taking differently: Take 2 tablets by mouth every 4 (four) hours as needed for moderate pain.  10/19/17  Yes Caryl Ada K, PA-C  ibuprofen (ADVIL,MOTRIN) 200 MG tablet Take 200 mg by mouth every 6 (six) hours as needed for fever or moderate pain.   Yes [provider]  levofloxacin (LEVAQUIN) 500 MG tablet Take 500 mg by mouth daily.   Yes [provider]  Menthol-Methyl Salicylate (MUSCLE RUB) 10-15 % CREA Apply 1 application topically daily as needed for muscle pain.    Yes [provider]   No Known Allergies  FAMILY HISTORY:  family history includes Breast cancer in her mother; Colon cancer in her brother; Heart disease in her father; Lung cancer in her brother. SOCIAL HISTORY:  reports that she has been smoking.  she has never used smokeless tobacco. She reports that she does not drink alcohol or use drugs.  REVIEW OF SYSTEMS:   All negative; except for those that are bolded, which indicate positives.  Constitutional: weight loss, weight gain, night sweats, fevers, chills, fatigue, weakness.  HEENT: headaches, sore throat, sneezing, nasal congestion, post nasal drip, difficulty swallowing, tooth/dental problems, visual complaints, visual changes, ear aches. Neuro: difficulty with speech, weakness, numbness, ataxia. CV:  chest pain,  orthopnea, PND, swelling in lower extremities, dizziness, palpitations, syncope.  Resp: cough, hemoptysis, dyspnea, wheezing. GI: heartburn, indigestion, abdominal pain, nausea, vomiting, diarrhea, constipation, change in bowel habits, loss of appetite, hematemesis, melena, hematochezia.  GU: dysuria, change in color of urine, urgency or frequency,  flank pain, hematuria. MSK: joint pain or swelling, decreased range of motion. Psych: change in mood or affect, depression, anxiety, suicidal ideations, homicidal ideations. Skin: rash, itching, bruising.   SUBJECTIVE:  No acute events overnight.  VITAL SIGNS: Temp:  [98.1 F (36.7 C)-98.7 F (37.1 C)] 98.7 F (37.1 C) (02/20 0922) Pulse Rate:  [84-107] 84 (02/20 0922) Resp:  [17-23] 17 (02/20 0922) BP: (114-130)/(69-97) 114/97 (02/20 0922) SpO2:  [97 %-98 %] 97 % (02/20 0922) Weight:  [140 lb (63.5 kg)-140 lb 1.2 oz (63.5 kg)] 140 lb (63.5 kg) (02/20 1312)  PHYSICAL EXAMINATION: General: Adult female, in stretcher, in NAD. Neuro: A&O x 3, non-focal.  HEENT: [place]/AT. EOMI, sclerae anicteric. Cardiovascular: RRR, no M/R/G.  Lungs: Respirations even and unlabored.  CTA bilaterally, No W/R/R.  Abdomen: BS x 4, soft, NT/ND.  Musculoskeletal: No gross deformities, no edema.  Skin: Intact, warm, no rashes.   Recent Labs  Lab 12/09/17 1929 12/10/17 0257  NA 135 135  K 3.0* 3.4*  CL 97* 99*  CO2 27 22  BUN 5* <5*  CREATININE 0.89 0.76  GLUCOSE 122* 165*   Recent Labs  Lab 12/09/17 1929 12/10/17 0257  HGB 11.8* 11.3*  HCT 35.2* 33.6*  WBC 7.7 9.9  PLT 414* 407*   Dg Chest 2 View  Result Date: 12/09/2017 CLINICAL DATA:  Cough and right-sided chest pain for 1 week. EXAM: CHEST  2 VIEW COMPARISON:  None. FINDINGS: The heart size is normal. There is mild perihilar peribronchial thickening. There is right-sided volume loss secondary to right upper lobe collapse. The findings are suspicious for right upper lobe/hilar lesion. Small right pleural effusion. Left lung is essentially clear. No pulmonary edema. IMPRESSION: Right upper lobe collapse. Recommend further evaluation CT of the chest. Intravenous contrast is recommended unless contraindicated. Electronically Signed   By: Nolon Nations M.D.   On: 12/09/2017 20:42   Ct Chest W Contrast  Result Date: 12/10/2017 CLINICAL  DATA:  Chest pain or shortness of breath. Right upper lobe collapse. EXAM: CT CHEST WITH CONTRAST TECHNIQUE: Multidetector CT imaging of the chest was performed during intravenous contrast administration. 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 Coronary artery calcification. Small pericardial effusion. Normal heart size. Calcific atherosclerosis of the aorta. Mediastinum/Nodes: Massive pretracheal lymphadenopathy with associated narrowing of the superior vena cava. Right hilar mass encases the right pulmonary artery, with occlusion of the right upper lobe artery. The mass measures approximately 5.5 x 5.1 cm. Lungs/Pleura: There is complete right upper lobe collapse due to occlusion of the right upper lobe bronchus by a right hilar mass. Right middle lobe is clear. There is a lobulated right basilar pleural effusion. The left lung is clear suspected 2.5 cm mass of the right posterior pleura. Upper Abdomen: Hypoattenuation of the anterior left kidney upper pole, possibly artifactual. Otherwise normal visualized upper abdominal organs. Musculoskeletal: No acute osseous abnormality. No lytic or blastic lesions. IMPRESSION: 1. Large right hilar mass encasing the right pulmonary artery and occluding the right upper lobe bronchus with associated complete right upper lobe collapse. The right upper lobe pulmonary artery also likely occluded. This is most consistent with a  primary lung carcinoma. 2. General lack of enhancement of the right upper lobe parenchyma may be due to necrosis or vascular compromise. Necrotic tumor within atelectatic lung is also a possibility. 3. Right pleural effusion with area of rounded hyperattenuation along the posterior pleura concerning for a pleural based mass. 4. Narrowing of the superior vena cava secondary to mass effect from right hilar mass and  confluent mediastinal adenopathy. 5. Small pericardial effusion. 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 Electronically Signed   By: Ulyses Jarred M.D.   On: 12/10/2017 02:17    STUDIES:  CT chest 2/19 > large right hilar mass encasing the right PA and occluding the RUL bronchus with complete RUL collapse, questionable right pleural effusion with area of rounded hyperattenuation along posterior pleura concerning for a pleural mass, narrowing of SVC, small pericardial effusion. I have reviewed the images personally  SIGNIFICANT EVENTS  2/19 > admit.  ASSESSMENT / PLAN:  Large right hilar mass - given pt's tobacco hx, this is strongly suspicious for primary bronchogenic carcinoma. Plan: Given location of mass, EBUS would likely be the best option in terms of biopsy. Risk benefit discussed with patient and she is OK to proceed.   Marshell Garfinkel MD [facility] Pager [phone] If no answer or after 3pm call: [phone] 12/11/2017, 2:48 PM

## 2017-12-11 NOTE — Transfer of Care (Signed)
Immediate Anesthesia Transfer of Care Note  Patient: Beth Alexander  Procedure(s) Performed: VIDEO BRONCHOSCOPY WITH ENDOBRONCHIAL ULTRASOUND (N/A )  Patient Location: PACU  Anesthesia Type:General  Level of Consciousness: awake, alert , oriented and patient cooperative  Airway & Oxygen Therapy: Patient Spontanous Breathing and Patient connected to nasal cannula oxygen  Post-op Assessment: Report given to RN, Post -op Vital signs reviewed and stable and Patient moving all extremities X 4  Post vital signs: Reviewed and stable  Last Vitals:  Vitals:   12/11/17 0922 12/11/17 1617  BP: (!) 114/97   Pulse: 84   Resp: 17   Temp: 37.1 C (!) 36.4 C  SpO2: 97%     Last Pain:  Vitals:   12/11/17 0922  TempSrc: Oral  PainSc:       Patients Stated Pain Goal: 0 (93/40/68 4033)  Complications: No apparent anesthesia complications   Neosynepherine continued in PACU, Dr. Ambrose Pancoast aware

## 2017-12-11 NOTE — Progress Notes (Signed)
PROGRESS NOTE    Beth Alexander  XVQ:[id number] DOB: [birth year] DOA: 12/09/2017 PCP: [name], MD  Brief Narrative:Beth Alexander is a 67 y.o. female with medical history significant of hypertension, tobacco abuse, atypical ductal hyperplasia left breast status post left lumpectomy, who presents with cough and shortness of breath. CT chest concerning for a large right hilar mass   Assessment & Plan:     Large right hilar mass with small pleural effusion -Concerning for primary lung cancer, this mass is encasing right pulmonary artery and causing right upper bronchus occlusion and collapse -She is at risk of post obstructive pneumonia however no clinical evidence of such at this time -Smoker, recently quit -Pulmonary consulted, plan for Bronch and biopsy today -continue nebs  Atypical ductal hyperplasia of left breast: -History of recent left lumpectomy -Follow-up with oncology Dr.Gudena  HTN:  -Continue amlodipine  Tobacco abuse:   -smoked 1 pack a day, quit on February 15 -Nicotine patch  Hypokalemia: K= 3.0 on admission. - Repleted -Mag okay   DVT ppx: SCD Code Status: Full code Family Communication: None at bed side.     Disposition Plan: Home pending workup   Consultants:   Pulm   Procedures:   Antimicrobials:    Subjective: -feels ok, awaiting Biopsy  Objective: Vitals:   12/10/17 2031 12/11/17 0430 12/11/17 0922 12/11/17 1312  BP: 127/69 130/72 (!) 114/97   Pulse: 90 91 84   Resp: 20 20 17    Temp: 98.4 F (36.9 C) 98.1 F (36.7 C) 98.7 F (37.1 C)   TempSrc: Oral Oral Oral   SpO2: 97% 98% 97%   Weight: 63.5 kg (140 lb 1.2 oz)   63.5 kg (140 lb)  Height:    5\' 3"  (1.6 m)    Intake/Output Summary (Last 24 hours) at 12/11/2017 1418 Last data filed at 12/11/2017 1002 Gross per 24 hour  Intake 2405 ml  Output 950 ml  Net 1455 ml   Filed Weights   12/09/17 1919 12/10/17 2031 12/11/17 1312  Weight: 63.5 kg (140 lb) 63.5 kg (140 lb 1.2 oz)  63.5 kg (140 lb)    Examination: Gen: Awake, Alert, Oriented X 3,  HEENT: PERRLA, Neck supple, no JVD Lungs: poor air movement CVS: RRR,No Gallops,Rubs or new Murmurs Abd: soft, Non tender, non distended, BS present Extremities: No Cyanosis, Clubbing or edema Skin: no new rashes Psychiatry: Judgement and insight appear normal. Mood & affect appropriate.     Data Reviewed:   CBC: Recent Labs  Lab 12/09/17 1929 12/10/17 0257  WBC 7.7 9.9  HGB 11.8* 11.3*  HCT 35.2* 33.6*  MCV 96.2 95.2  PLT 414* 093*   Basic Metabolic Panel: Recent Labs  Lab 12/09/17 1929 12/10/17 0257  NA 135 135  K 3.0* 3.4*  CL 97* 99*  CO2 27 22  GLUCOSE 122* 165*  BUN 5* <5*  CREATININE 0.89 0.76  CALCIUM 8.8* 8.9  MG  --  1.9   GFR: Estimated Creatinine Clearance: 62.9 mL/min (by C-G formula based on SCr of 0.76 mg/dL). Liver Function Tests: No results for input(s): AST, ALT, ALKPHOS, BILITOT, PROT, ALBUMIN in the last 168 hours. No results for input(s): LIPASE, AMYLASE in the last 168 hours. No results for input(s): AMMONIA in the last 168 hours. Coagulation Profile: Recent Labs  Lab 12/10/17 0257  INR 1.04   Cardiac Enzymes: No results for input(s): CKTOTAL, CKMB, CKMBINDEX, TROPONINI in the last 168 hours. BNP (last 3 results) No results for input(s): PROBNP in the  last 8760 hours. HbA1C: No results for input(s): HGBA1C in the last 72 hours. CBG: Recent Labs  Lab 12/10/17 0843 12/11/17 0801  GLUCAP 156* 90   Lipid Profile: No results for input(s): CHOL, HDL, LDLCALC, TRIG, CHOLHDL, LDLDIRECT in the last 72 hours. Thyroid Function Tests: No results for input(s): TSH, T4TOTAL, FREET4, T3FREE, THYROIDAB in the last 72 hours. Anemia Panel: No results for input(s): VITAMINB12, FOLATE, FERRITIN, TIBC, IRON, RETICCTPCT in the last 72 hours. Urine analysis: No results found for: COLORURINE, APPEARANCEUR, LABSPEC, PHURINE, GLUCOSEU, HGBUR, BILIRUBINUR, KETONESUR, PROTEINUR,  UROBILINOGEN, NITRITE, LEUKOCYTESUR Sepsis Labs: @LABRCNTIP (procalcitonin:4,lacticidven:4)  ) Recent Results (from the past 240 hour(s))  Surgical pcr screen     Status: None   Collection Time: 12/10/17 10:55 PM  Result Value Ref Range Status   MRSA, PCR NEGATIVE NEGATIVE Final   Staphylococcus aureus NEGATIVE NEGATIVE Final    Comment: (NOTE) The Xpert SA Assay (FDA approved for NASAL specimens in patients 69 years of age and older), is one component of a comprehensive surveillance program. It is not intended to diagnose infection nor to guide or monitor treatment. Performed at [facility], [place] [address], [place], [place] [address]          Radiology Studies: Dg Chest 2 View  Result Date: 12/09/2017 CLINICAL DATA:  Cough and right-sided chest pain for 1 week. EXAM: CHEST  2 VIEW COMPARISON:  None. FINDINGS: The heart size is normal. There is mild perihilar peribronchial thickening. There is right-sided volume loss secondary to right upper lobe collapse. The findings are suspicious for right upper lobe/hilar lesion. Small right pleural effusion. Left lung is essentially clear. No pulmonary edema. IMPRESSION: Right upper lobe collapse. Recommend further evaluation CT of the chest. Intravenous contrast is recommended unless contraindicated. Electronically Signed   By: Nolon Nations M.D.   On: 12/09/2017 20:42   Ct Chest W Contrast  Result Date: 12/10/2017 CLINICAL DATA:  Chest pain or shortness of breath. Right upper lobe collapse. EXAM: CT CHEST WITH CONTRAST TECHNIQUE: Multidetector CT imaging of the chest was performed during intravenous contrast administration. 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 Coronary artery calcification. Small pericardial effusion. Normal heart size. Calcific atherosclerosis of the aorta.  Mediastinum/Nodes: Massive pretracheal lymphadenopathy with associated narrowing of the superior vena cava. Right hilar mass encases the right pulmonary artery, with occlusion of the right upper lobe artery. The mass measures approximately 5.5 x 5.1 cm. Lungs/Pleura: There is complete right upper lobe collapse due to occlusion of the right upper lobe bronchus by a right hilar mass. Right middle lobe is clear. There is a lobulated right basilar pleural effusion. The left lung is clear suspected 2.5 cm mass of the right posterior pleura. Upper Abdomen: Hypoattenuation of the anterior left kidney upper pole, possibly artifactual. Otherwise normal visualized upper abdominal organs. Musculoskeletal: No acute osseous abnormality. No lytic or blastic lesions. IMPRESSION: 1. Large right hilar mass encasing the right pulmonary artery and occluding the right upper lobe bronchus with associated complete right upper lobe collapse. The right upper lobe pulmonary artery also likely occluded. 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 2. General lack of enhancement of the right upper lobe parenchyma may be due to necrosis or vascular compromise. Necrotic tumor within atelectatic lung is also a possibility. 3. Right pleural effusion with area of rounded hyperattenuation along the posterior pleura concerning for a pleural based mass. 4.  Narrowing of the superior vena cava secondary to mass effect from right hilar mass and confluent mediastinal adenopathy. 5. Small pericardial effusion. 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 Electronically Signed   By: Ulyses Jarred M.D.   On: 12/10/2017 02:17        Scheduled Meds: . [MAR Hold] amLODipine  5 mg Oral Daily  . [MAR Hold] aspirin EC  81 mg Oral Daily  . [MAR Hold] nicotine  21 mg Transdermal Daily   Continuous Infusions: . lactated ringers 10 mL/hr at 12/11/17 1319     LOS: 1 day    Time  spent: 48min    Domenic Polite, MD [facility] Page via [email or web address], password [email or web address] After 7PM please contact night-coverage  12/11/2017, 2:18 PM

## 2017-12-12 ENCOUNTER — Encounter (HOSPITAL_COMMUNITY): Payer: Self-pay | Admitting: Pulmonary Disease

## 2017-12-12 ENCOUNTER — Telehealth: Payer: Self-pay

## 2017-12-12 LAB — GLUCOSE, CAPILLARY: Glucose-Capillary: 103 mg/dL — ABNORMAL HIGH (ref 65–99)

## 2017-12-12 MED ORDER — NICOTINE 14 MG/24HR TD PT24: 14.0000 mg | MEDICATED_PATCH | Freq: Every day | TRANSDERMAL | 0 refills | Status: DC

## 2017-12-12 NOTE — Discharge Summary (Signed)
Physician Discharge Summary  [name] SAY:[id number] DOB: 1951-01-16 DOA: 12/09/2017  PCP: [name], MD  Admit date: 12/09/2017 Discharge date: 12/12/2017  Time spent: 35 minutes  Recommendations for Outpatient Follow-up:  1. [facility] Dr.Mannam on 2/27 at 12:30pm to discuss Endobronchial biopsy results and next steps   Discharge Diagnoses:  Principal Problem:   Lung mass Active Problems:   Atypical ductal hyperplasia of left breast   Hypertension   Tobacco use   Pleural effusion on right   Hypokalemia   Discharge Condition: stable  Diet recommendation: regular  Filed Weights   12/10/17 2031 12/11/17 1312 12/11/17 2020  Weight: 63.5 kg (140 lb 1.2 oz) 63.5 kg (140 lb) 63.5 kg (140 lb)    History of present illness:  Beth Alexander a 67 y.o.femalewith medical history significant ofhypertension, tobacco abuse, atypical ductal hyperplasia left breast status post left lumpectomy, whopresents with cough and shortness of breath. CT chest concerning for a large right hilar mass  Hospital Course:   Large right hilar mass with small pleural effusion -Concerning for primary lung cancer, this mass is encasing right pulmonary artery and causing right upper bronchus occlusion and collapse -She is at risk of post obstructive pneumonia however no clinical evidence of such at this time -Smoker, recently quit -Pulmonary consulted, underwent Bronch and biopsy yesterday, results pending, she remains stable for discharge and will FU with Dr.Mannam 2/17 at 12:30in office for Biopsy results  Atypical ductal hyperplasia of left breast: -History of recent left lumpectomy -Follow-up with oncology Dr.Gudena  HTN:  -Continue amlodipine  Tobacco abuse: -smoked 1 pack a day,quit on February 15 -Nicotine patch  Hypokalemia: K=3.0on admission. - Repleted -Mag okay    Procedures:Flexible video fiberoptic bronchoscopy with endobronchial ultrasound and biopsies ON  2/20  Consultations:  pULM  Discharge Exam: Vitals:   12/12/17 0458 12/12/17 0942  BP: 138/62 135/71  Pulse: 83 82  Resp: 18 18  Temp: 98.3 F (36.8 C) 98.8 F (37.1 C)  SpO2: 94% 94%    General: AAOx3 Cardiovascular:S1S2/RRR Respiratory: CTAB  Discharge Instructions   Discharge Instructions    Diet - low sodium heart healthy   Complete by:  As directed    Increase activity slowly   Complete by:  As directed      Allergies as of 12/12/2017   No Known Allergies     Medication List    STOP taking these medications   dextromethorphan 30 MG/5ML liquid Commonly known as:  DELSYM   levofloxacin 500 MG tablet Commonly known as:  LEVAQUIN     TAKE these medications   acetaminophen 500 MG tablet Commonly known as:  TYLENOL Take 500 mg by mouth every 6 (six) hours as needed for mild pain or headache.   amLODipine 5 MG tablet Commonly known as:  NORVASC Take 1 tablet (5 mg total) by mouth daily.   aspirin EC 81 MG tablet Take 81 mg by mouth daily.   dextromethorphan-guaiFENesin 30-600 MG 12hr tablet Commonly known as:  MUCINEX DM Take 1 tablet by mouth 2 (two) times daily as needed for cough.   HYDROcodone-acetaminophen 5-325 MG tablet Commonly known as:  NORCO/VICODIN Take 2 tablets by mouth every 4 (four) hours as needed. What changed:  reasons to take this   ibuprofen 200 MG tablet Commonly known as:  ADVIL,MOTRIN Take 200 mg by mouth every 6 (six) hours as needed for fever or moderate pain.   MUSCLE RUB 10-15 % Crea Apply 1 application topically daily as needed for  muscle pain.   nicotine 14 mg/24hr patch Commonly known as:  NICODERM CQ - dosed in mg/24 hours Place 1 patch (14 mg total) onto the skin daily. Start taking on:  12/13/2017      No Known Allergies Follow-up Information    Marshell Garfinkel, MD Follow up on 12/18/2017.   Specialty:  Pulmonary Disease Why:  at 12:30pm Contact information: [address] [place]  [address] [phone]            The results of significant diagnostics from this hospitalization (including imaging, microbiology, ancillary and laboratory) are listed below for reference.    Significant Diagnostic Studies: Dg Chest 2 View  Result Date: 12/09/2017 CLINICAL DATA:  Cough and right-sided chest pain for 1 week. EXAM: CHEST  2 VIEW COMPARISON:  None. FINDINGS: The heart size is normal. There is mild perihilar peribronchial thickening. There is right-sided volume loss secondary to right upper lobe collapse. The findings are suspicious for right upper lobe/hilar lesion. Small right pleural effusion. Left lung is essentially clear. No pulmonary edema. IMPRESSION: Right upper lobe collapse. Recommend further evaluation CT of the chest. Intravenous contrast is recommended unless contraindicated. Electronically Signed   By: Nolon Nations M.D.   On: 12/09/2017 20:42   Ct Chest W Contrast  Result Date: 12/10/2017 CLINICAL DATA:  Chest pain or shortness of breath. Right upper lobe collapse. EXAM: CT CHEST WITH CONTRAST TECHNIQUE: Multidetector CT imaging of the chest was performed during intravenous contrast administration. 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 Coronary artery calcification. Small pericardial effusion. Normal heart size. Calcific atherosclerosis of the aorta. Mediastinum/Nodes: Massive pretracheal lymphadenopathy with associated narrowing of the superior vena cava. Right hilar mass encases the right pulmonary artery, with occlusion of the right upper lobe artery. The mass measures approximately 5.5 x 5.1 cm. Lungs/Pleura: There is complete right upper lobe collapse due to occlusion of the right upper lobe bronchus by a right hilar mass. Right middle lobe is clear. There is a lobulated right basilar pleural effusion. The left lung is  clear suspected 2.5 cm mass of the right posterior pleura. Upper Abdomen: Hypoattenuation of the anterior left kidney upper pole, possibly artifactual. Otherwise normal visualized upper abdominal organs. Musculoskeletal: No acute osseous abnormality. No lytic or blastic lesions. IMPRESSION: 1. Large right hilar mass encasing the right pulmonary artery and occluding the right upper lobe bronchus with associated complete right upper lobe collapse. The right upper lobe pulmonary artery also likely occluded. 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 2. General lack of enhancement of the right upper lobe parenchyma may be due to necrosis or vascular compromise. Necrotic tumor within atelectatic lung is also a possibility. 3. Right pleural effusion with area of rounded hyperattenuation along the posterior pleura concerning for a pleural based mass. 4. Narrowing of the superior vena cava secondary to mass effect from right hilar mass and confluent mediastinal adenopathy. 5. Small pericardial effusion. 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 Electronically Signed   By: Ulyses Jarred M.D.   On: 12/10/2017 02:17   Dg Chest Port 1 View  Result Date: 12/11/2017 CLINICAL DATA:  66 year old female status post bronchoscopic biopsy. EXAM: PORTABLE CHEST 1 VIEW COMPARISON:  Chest CT 12/10/2017 and earlier. FINDINGS: Portable AP semi upright view at 1625 hrs. Stable lung volumes. Unchanged masslike opacity throughout the right upper lung, inseparable  from the right hilum. No pneumothorax. Trace right pleural effusion appears stable. Stable cardiac size and mediastinal contours. No new pulmonary opacity. Mildly to moderately increased gaseous distension of the stomach. IMPRESSION: 1. No adverse features status post bronchoscopic biopsy. 2. No new cardiopulmonary abnormality. 3. Mild to moderate new gaseous distension of the stomach. Electronically Signed    By: Genevie Ann M.D.   On: 12/11/2017 16:50    Microbiology: Recent Results (from the past 240 hour(s))  Surgical pcr screen     Status: None   Collection Time: 12/10/17 10:55 PM  Result Value Ref Range Status   MRSA, PCR NEGATIVE NEGATIVE Final   Staphylococcus aureus NEGATIVE NEGATIVE Final    Comment: (NOTE) The Xpert SA Assay (FDA approved for NASAL specimens in patients 77 years of age and older), is one component of a comprehensive surveillance program. It is not intended to diagnose infection nor to guide or monitor treatment. Performed at [facility], [place] [address] [place], [place] [address]      Labs: Basic Metabolic Panel: Recent Labs  Lab 12/09/17 1929 12/10/17 0257  NA 135 135  K 3.0* 3.4*  CL 97* 99*  CO2 27 22  GLUCOSE 122* 165*  BUN 5* <5*  CREATININE 0.89 0.76  CALCIUM 8.8* 8.9  MG  --  1.9   Liver Function Tests: No results for input(s): AST, ALT, ALKPHOS, BILITOT, PROT, ALBUMIN in the last 168 hours. No results for input(s): LIPASE, AMYLASE in the last 168 hours. No results for input(s): AMMONIA in the last 168 hours. CBC: Recent Labs  Lab 12/09/17 1929 12/10/17 0257  WBC 7.7 9.9  HGB 11.8* 11.3*  HCT 35.2* 33.6*  MCV 96.2 95.2  PLT 414* 407*   Cardiac Enzymes: No results for input(s): CKTOTAL, CKMB, CKMBINDEX, TROPONINI in the last 168 hours. BNP: BNP (last 3 results) No results for input(s): BNP in the last 8760 hours.  ProBNP (last 3 results) No results for input(s): PROBNP in the last 8760 hours.  CBG: Recent Labs  Lab 12/10/17 0843 12/11/17 0801 12/12/17 0803  GLUCAP 156* 90 103*       Signed:  Domenic Polite MD.  [facility] 12/12/2017, 12:32 PM

## 2017-12-12 NOTE — Progress Notes (Addendum)
Name: Beth Alexander MRN: [id number] DOB: 20-Feb-1951    ADMISSION DATE:  12/09/2017 CONSULTATION DATE:  12/10/17  REFERRING MD :  Broadus John  CHIEF COMPLAINT:  Lung Mass   HISTORY OF PRESENT ILLNESS:  Beth Alexander is a 67 y.o. female with a PMH as outlined below including but not limited to atypical ductal hyperplasia of the left breast, s/p lumpectomy and left radioactive seed placement on 11/08/16.  She is followed by Dr. Lindi Adie of oncology who recommended lifestyle changes along with annual mammograms and breast exams.  On 2/19, she presented to [facility] ED with cough and SOB x 2 weeks.  She had CT of the chest that demonstrated a large right hilar mass encasing the right PA and occluding the RUL bronchus with complete RUL collapse, questionable right pleural effusion with area of rounded hyperattenuation along posterior pleura concerning for a pleural mass, narrowing of SVC, small pericardial effusion.  PCCM was called in consultation 2/19 for recs regarding biopsy options.  Pt is a current everyday smoker.  She has a 50 pack year history.  Besides hx of atypical ductal hyperplasia of left breast, she has no additional hx of malignancy.  PAST MEDICAL HISTORY :   has a past medical history of Hypertension and Tobacco use.  has a past surgical history that includes Breast lumpectomy with radioactive seed localization (Left, 11/08/2016). Prior to Admission medications   Medication Sig Start Date End Date Taking? Authorizing Provider  acetaminophen (TYLENOL) 500 MG tablet Take 500 mg by mouth every 6 (six) hours as needed for mild pain or headache.   Yes [provider]  amLODipine (NORVASC) 5 MG tablet Take 1 tablet (5 mg total) by mouth daily. 06/30/14  Yes Tysinger, Camelia Eng, PA-C  aspirin EC 81 MG tablet Take 81 mg by mouth daily.   Yes [provider]  dextromethorphan (DELSYM) 30 MG/5ML liquid Take 30 mg by mouth at bedtime as needed for cough.   Yes [provider]   dextromethorphan-guaiFENesin (MUCINEX DM) 30-600 MG 12hr tablet Take 1 tablet by mouth 2 (two) times daily as needed for cough.   Yes [provider]  HYDROcodone-acetaminophen (NORCO/VICODIN) 5-325 MG tablet Take 2 tablets by mouth every 4 (four) hours as needed. Patient taking differently: Take 2 tablets by mouth every 4 (four) hours as needed for moderate pain.  10/19/17  Yes Caryl Ada K, PA-C  ibuprofen (ADVIL,MOTRIN) 200 MG tablet Take 200 mg by mouth every 6 (six) hours as needed for fever or moderate pain.   Yes [provider]  levofloxacin (LEVAQUIN) 500 MG tablet Take 500 mg by mouth daily.   Yes [provider]  Menthol-Methyl Salicylate (MUSCLE RUB) 10-15 % CREA Apply 1 application topically daily as needed for muscle pain.    Yes [provider]   No Known Allergies  FAMILY HISTORY:  family history includes Breast cancer in her mother; Colon cancer in her brother; Heart disease in her father; Lung cancer in her brother. SOCIAL HISTORY:  reports that she has been smoking.  she has never used smokeless tobacco. She reports that she does not drink alcohol or use drugs.  REVIEW OF SYSTEMS:   All negative; except for those that are bolded, which indicate positives.  Constitutional: weight loss, weight gain, night sweats, fevers, chills, fatigue, weakness.  HEENT: headaches, sore throat, sneezing, nasal congestion, post nasal drip, difficulty swallowing, tooth/dental problems, visual complaints, visual changes, ear aches. Neuro: difficulty with speech, weakness, numbness, ataxia. CV:  chest pain,  orthopnea, PND, swelling in lower extremities, dizziness, palpitations, syncope.  Resp: cough, hemoptysis, dyspnea, wheezing. GI: heartburn, indigestion, abdominal pain, nausea, vomiting, diarrhea, constipation, change in bowel habits, loss of appetite, hematemesis, melena, hematochezia.  GU: dysuria, change in color of urine, urgency or frequency,  flank pain, hematuria. MSK: joint pain or swelling, decreased range of motion. Psych: change in mood or affect, depression, anxiety, suicidal ideations, homicidal ideations. Skin: rash, itching, bruising.   SUBJECTIVE:  Underwent bronchoscopy with biopsy yesterday.  Stable postop with no new complaints.  VITAL SIGNS: Temp:  [97.2 F (36.2 C)-98.8 F (37.1 C)] 98.8 F (37.1 C) (02/21 0942) Pulse Rate:  [82-94] 82 (02/21 0942) Resp:  [18-30] 18 (02/21 0942) BP: (94-138)/(39-82) 135/71 (02/21 0942) SpO2:  [90 %-98 %] 94 % (02/21 0942) Weight:  [140 lb (63.5 kg)] 140 lb (63.5 kg) (02/20 2020)  PHYSICAL EXAMINATION: Gen:      No acute distress, on room air HEENT:  EOMI, sclera anicteric Neck:     No masses; no thyromegaly Lungs:    Clear to auscultation bilaterally; normal respiratory effort CV:         Regular rate and rhythm; no murmurs Abd:      + bowel sounds; soft, non-tender; no palpable masses, no distension Ext:    No edema; adequate peripheral perfusion Skin:      Warm and dry; no rash Neuro: alert and oriented x 3 Psych: normal mood and affect  Recent Labs  Lab 12/09/17 1929 12/10/17 0257  NA 135 135  K 3.0* 3.4*  CL 97* 99*  CO2 27 22  BUN 5* <5*  CREATININE 0.89 0.76  GLUCOSE 122* 165*   Recent Labs  Lab 12/09/17 1929 12/10/17 0257  HGB 11.8* 11.3*  HCT 35.2* 33.6*  WBC 7.7 9.9  PLT 414* 407*   Dg Chest Port 1 View  Result Date: 12/11/2017 CLINICAL DATA:  67 year old female status post bronchoscopic biopsy. EXAM: PORTABLE CHEST 1 VIEW COMPARISON:  Chest CT 12/10/2017 and earlier. FINDINGS: Portable AP semi upright view at 1625 hrs. Stable lung volumes. Unchanged masslike opacity throughout the right upper lung, inseparable from the right hilum. No pneumothorax. Trace right pleural effusion appears stable. Stable cardiac size and mediastinal contours. No new pulmonary opacity. Mildly to moderately increased gaseous distension of the stomach. IMPRESSION: 1.  No adverse features status post bronchoscopic biopsy. 2. No new cardiopulmonary abnormality. 3. Mild to moderate new gaseous distension of the stomach. Electronically Signed   By: Genevie Ann M.D.   On: 12/11/2017 16:50    STUDIES:  CT chest 2/19 > large right hilar mass encasing the right PA and occluding the RUL bronchus with complete RUL collapse, questionable right pleural effusion with area of rounded hyperattenuation along posterior pleura concerning for a pleural mass, narrowing of SVC, small pericardial effusion. I have reviewed the images personally  SIGNIFICANT EVENTS  2/19 > admit.  ASSESSMENT / PLAN:  Large right hilar mass - given pt's tobacco hx, this is strongly suspicious for primary bronchogenic carcinoma. Plan: Stable post biopsy Findings on bronchoscope with endobronchial mass concerning for malignancy Final pathology is pending. I have discussed this with the patient.  She is stable for discharge from our perspective Follow-up made in pulmonary clinic on 2/27 at 12:30 pm to discuss results and plan for next steps.   Marshell Garfinkel MD [facility] Pager [phone] If no answer or after 3pm call: [phone] 12/12/2017, 11:20 AM

## 2017-12-12 NOTE — Care Management Note (Signed)
Case Management Note  Patient Details  Name: Maegan Buller MRN: [id number] Date of Birth: [birth year]  Subjective/Objective:   Admitted for Lung Mass.           Action/Plan: pending pathology from [facility] on 2/20.  Expected Discharge Date:     TBD             Expected Discharge Plan:  Home/Self Care  Discharge planning Services  CM Consult  Status of Service:  In process, will continue to follow  Additional Comments: In to speak with patient. Prior to admission patient lived at home alone. Her brother has been staying with her temporarily. Verified patient PCP. Uses [facility] in [place].  Has never used [facility] before.  States if she requires [facility] post discharge, she has no preference; declined to review [facility] in her area.  Home Health DME: none. Will be returning to the same living situation after discharge.  Patient has the ability to pay for your medication and food.  At discharge, patient has transportation home.  Kristen Cardinal Nurse case [place] 12/12/2017, 11:05 AM

## 2017-12-12 NOTE — Progress Notes (Signed)
Pt discharged home with brother. AVS and script given to and reviewed in full with patient. Patient aware of appointment made for her next week with pulmonology. All belongings sent with patient. VSS. BP 128/70 (BP Location: Right Arm)   Pulse 86   Temp 97.9 F (36.6 C) (Oral)   Resp 20   Ht 5\' 3"  (1.6 m)   Wt 63.5 kg (140 lb)   SpO2 94%   BMI 24.80 kg/m

## 2017-12-12 NOTE — Telephone Encounter (Signed)
-----   Message from Marshell Garfinkel, MD sent at 12/12/2017 10:44 AM EST ----- Regarding: Hospital followup. Please make follow up appointment with me in clinic on 2/27 at 12:30 pm. Thanks

## 2017-12-12 NOTE — Plan of Care (Signed)
  Progressing Health Behavior/Discharge Planning: Ability to manage health-related needs will improve 12/12/2017 1031 - Progressing by Harlin Heys, RN Clinical Measurements: Ability to maintain clinical measurements within normal limits will improve 12/12/2017 1031 - Progressing by Harlin Heys, RN Will remain free from infection 12/12/2017 1031 - Progressing by Harlin Heys, RN Diagnostic test results will improve 12/12/2017 1031 - Progressing by Harlin Heys, RN Respiratory complications will improve 12/12/2017 1031 - Progressing by Harlin Heys, RN Cardiovascular complication will be avoided 12/12/2017 1031 - Progressing by Harlin Heys, RN Activity: Risk for activity intolerance will decrease 12/12/2017 1031 - Progressing by Harlin Heys, RN Nutrition: Adequate nutrition will be maintained 12/12/2017 1031 - Progressing by Harlin Heys, RN Coping: Level of anxiety will decrease 12/12/2017 1031 - Progressing by Harlin Heys, RN Elimination: Will not experience complications related to bowel motility 12/12/2017 1031 - Progressing by Harlin Heys, RN Will not experience complications related to urinary retention 12/12/2017 1031 - Progressing by Harlin Heys, RN Safety: Ability to remain free from injury will improve 12/12/2017 1031 - Progressing by Harlin Heys, RN Skin Integrity: Risk for impaired skin integrity will decrease 12/12/2017 1031 - Progressing by Harlin Heys, RN

## 2017-12-12 NOTE — Anesthesia Postprocedure Evaluation (Signed)
Anesthesia Post Note  Patient: Beth Alexander  Procedure(s) Performed: VIDEO BRONCHOSCOPY WITH ENDOBRONCHIAL ULTRASOUND (N/A )     Patient location during evaluation: PACU Anesthesia Type: General Level of consciousness: awake and alert Pain management: pain level controlled Vital Signs Assessment: post-procedure vital signs reviewed and stable Respiratory status: spontaneous breathing, nonlabored ventilation, respiratory function stable and patient connected to nasal cannula oxygen Cardiovascular status: blood pressure returned to baseline and stable Postop Assessment: no apparent nausea or vomiting Anesthetic complications: no    Last Vitals:  Vitals:   12/12/17 0942 12/12/17 1339  BP: 135/71 128/70  Pulse: 82 86  Resp: 18 20  Temp: 37.1 C 36.6 C  SpO2: 94% 94%    Last Pain:  Vitals:   12/12/17 1339  TempSrc: Oral  PainSc:    Pain Goal: Patients Stated Pain Goal: 0 (12/10/17 2040)               Karilyn Wind

## 2017-12-12 NOTE — Telephone Encounter (Signed)
Pt has been scheduled for f/u with PM on 12/18/17 at 12:30. Pt is aware of apt and voiced her understanding.  Nothing further is needed.

## 2017-12-12 NOTE — Progress Notes (Signed)
Labs reviewed > pending pathology from [facility] on 2/20.  PCCM will follow up once pathology returned.    Noe Gens, NP-C [facility] Pgr: [phone] or if no answer [phone] 12/12/2017, 10:35 AM

## 2017-12-13 NOTE — Telephone Encounter (Signed)
lmtcb x2 for pt. 

## 2017-12-13 NOTE — Telephone Encounter (Signed)
Per Dr. Vaughan Browner verbally- see if pt can come in at 8:30 vs 12:30 lmtcb x1 for pt.

## 2017-12-16 ENCOUNTER — Telehealth: Payer: Self-pay | Admitting: Internal Medicine

## 2017-12-16 ENCOUNTER — Other Ambulatory Visit: Payer: Self-pay

## 2017-12-16 DIAGNOSIS — C3490 Malignant neoplasm of unspecified part of unspecified bronchus or lung: Secondary | ICD-10-CM

## 2017-12-16 NOTE — Telephone Encounter (Signed)
Pt has been scheduled for 8:30 on 2/27. Pt is aware and voiced her understanding.  Nothing further is needed.

## 2017-12-16 NOTE — Telephone Encounter (Signed)
Per Dr. Vaughan Browner- place ref to oncology. Pt is aware. Will close encounter, as nothing further is needed.

## 2017-12-16 NOTE — Telephone Encounter (Signed)
Appt has been scheduled for the pt to see Dr. Julien Nordmann on 3/1 at [place]. Pt aware to arrive 30 minutes early.

## 2017-12-18 ENCOUNTER — Ambulatory Visit (INDEPENDENT_AMBULATORY_CARE_PROVIDER_SITE_OTHER): Payer: Medicare HMO | Admitting: Pulmonary Disease

## 2017-12-18 ENCOUNTER — Inpatient Hospital Stay: Payer: Medicare HMO | Admitting: Pulmonary Disease

## 2017-12-18 ENCOUNTER — Encounter: Payer: Self-pay | Admitting: Pulmonary Disease

## 2017-12-18 VITALS — BP 126/78 | HR 87 | Ht 63.0 in | Wt 135.8 lb

## 2017-12-18 DIAGNOSIS — C3491 Malignant neoplasm of unspecified part of right bronchus or lung: Secondary | ICD-10-CM

## 2017-12-18 DIAGNOSIS — J449 Chronic obstructive pulmonary disease, unspecified: Secondary | ICD-10-CM

## 2017-12-18 NOTE — Patient Instructions (Signed)
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 Julien Nordmann on 3/1 at 9 AM Congratulations on quitting smoking. We will make a follow-up visit in 6 months with pulmonary function test.

## 2017-12-18 NOTE — Progress Notes (Signed)
Beth Alexander    [id number]    04-Jun-1951  Primary Care Physician:Avbuere, Christean Grief, MD  Referring Physician: [name], [address] [place], [place] [address]  Chief complaint: New diagnosis of squamous cell cancer of lung  HPI: Beth Alexander is a 67 y.o. female with a PMH as outlined below including but not limited to atypical ductal hyperplasia of the left breast, s/p lumpectomy and left radioactive seed placement on 11/08/16.  She is followed by Dr. Lindi Adie of oncology who recommended lifestyle changes along with annual mammograms and breast exams.  On 2/19, she presented to [facility] ED with cough and SOB x 2 weeks.  She had CT of the chest that demonstrated a large right hilar mass encasing the right PA and occluding the RUL bronchus with complete RUL collapse, questionable right pleural effusion with area of rounded hyperattenuation along posterior pleura concerning for a pleural mass, narrowing of SVC, small pericardial effusion.  PCCM was called in consultation 2/19 for recs regarding biopsy options.  She underwent bronchoscope with the EBUS biopsy on 2/20 which showed squamous cell cancer of the lung. No new respiratory complaints.  Still has occasional dyspnea on exertion with wheezing.  Denies any cough, sputum production, hemoptysis.  Pets: Dog, no cats, birds, farm animals Occupation: Retired Secretary/administrator Exposures: No mold, hot tub Smoking history: 50 pack year smoking history.  Quit a few weeks ago. Travel History: No significant travel  Outpatient Encounter Medications as of 12/18/2017  Medication Sig  . acetaminophen (TYLENOL) 500 MG tablet Take 500 mg by mouth every 6 (six) hours as needed for mild pain or headache.  Marland Kitchen amLODipine (NORVASC) 5 MG tablet Take 1 tablet (5 mg total) by mouth daily.  Marland Kitchen aspirin EC 81 MG tablet Take 81 mg by mouth daily.  Marland Kitchen dextromethorphan-guaiFENesin (MUCINEX DM) 30-600 MG 12hr tablet Take 1 tablet by mouth 2 (two) times daily as  needed for cough.  Marland Kitchen HYDROcodone-acetaminophen (NORCO/VICODIN) 5-325 MG tablet Take 2 tablets by mouth every 4 (four) hours as needed. (Patient taking differently: Take 2 tablets by mouth every 4 (four) hours as needed for moderate pain. )  . ibuprofen (ADVIL,MOTRIN) 200 MG tablet Take 200 mg by mouth every 6 (six) hours as needed for fever or moderate pain.  . Menthol-Methyl Salicylate (MUSCLE RUB) 10-15 % CREA Apply 1 application topically daily as needed for muscle pain.   . nicotine (NICODERM CQ - DOSED IN MG/24 HOURS) 14 mg/24hr patch Place 1 patch (14 mg total) onto the skin daily.   No facility-administered encounter medications on file as of 12/18/2017.     Allergies as of 12/18/2017  . (No Known Allergies)    Past Medical History:  Diagnosis Date  . Hypertension   . Tobacco use     Past Surgical History:  Procedure Laterality Date  . BREAST LUMPECTOMY WITH RADIOACTIVE SEED LOCALIZATION Left 11/08/2016   Procedure: LEFT BREAST LUMPECTOMY WITH RADIOACTIVE SEED LOCALIZATION;  Surgeon: Donnie Mesa, MD;  Location: [place];  Service: General;  Laterality: Left;  Marland Kitchen VIDEO BRONCHOSCOPY WITH ENDOBRONCHIAL ULTRASOUND N/A 12/11/2017   Procedure: VIDEO BRONCHOSCOPY WITH ENDOBRONCHIAL ULTRASOUND;  Surgeon: Marshell Garfinkel, MD;  Location: [place];  Service: Pulmonary;  Laterality: N/A;    Family History  Problem Relation Age of Onset  . Breast cancer Mother   . Heart disease Father   . Lung cancer Brother   . Colon cancer Brother     Social History  Socioeconomic History  . Marital status: Divorced    Spouse name: Not on file  . Number of children: Not on file  . Years of education: Not on file  . Highest education level: Not on file  Social Needs  . Financial resource strain: Not on file  . Food insecurity - worry: Not on file  . Food insecurity - inability: Not on file  . Transportation needs - medical: Not on file  . Transportation needs - non-medical: Not  on file  Occupational History  . Not on file  Tobacco Use  . Smoking status: Current Every Day Smoker  . Smokeless tobacco: Never Used  . Tobacco comment: quit smoking 12/10/17--12/18/17  Substance and Sexual Activity  . Alcohol use: No  . Drug use: No  . Sexual activity: Not on file  Other Topics Concern  . Not on file  Social History Narrative  . Not on file    Review of systems: Review of Systems  Constitutional: Negative for fever and chills.  HENT: Negative.   Eyes: Negative for blurred vision.  Respiratory: as per HPI  Cardiovascular: Negative for chest pain and palpitations.  Gastrointestinal: Negative for vomiting, diarrhea, blood per rectum. Genitourinary: Negative for dysuria, urgency, frequency and hematuria.  Musculoskeletal: Negative for myalgias, back pain and joint pain.  Skin: Negative for itching and rash.  Neurological: Negative for dizziness, tremors, focal weakness, seizures and loss of consciousness.  Endo/Heme/Allergies: Negative for environmental allergies.  Psychiatric/Behavioral: Negative for depression, suicidal ideas and hallucinations.  All other systems reviewed and are negative.  Physical Exam: Blood pressure 126/78, pulse 87, SpO2 99 %. Gen:      No acute distress HEENT:  EOMI, sclera anicteric Neck:     No masses; no thyromegaly Lungs:    Clear to auscultation bilaterally; normal respiratory effort CV:         Regular rate and rhythm; no murmurs Abd:      + bowel sounds; soft, non-tender; no palpable masses, no distension Ext:    No edema; adequate peripheral perfusion Skin:      Warm and dry; no rash Neuro: alert and oriented x 3 Psych: normal mood and affect  Data Reviewed: CT chest 2/19 > large right hilar mass encasing the right PA and occluding the RUL bronchus with complete RUL collapse, questionable right pleural effusion with area of rounded hyperattenuation along posterior pleura concerning for a pleural mass, narrowing of SVC,  small pericardial effusion. I have reviewed the images personally  EBUS Bronchoscope 12/11/17 Right upper lobe endobronchial biopsy, brushing-squamous cell cancer 4R lymph node-squamous cell cancer   Assessment:  Squamous cell cancer of the lung Discussed biopsy results with patient and her sister. She has stage III, possibly stage IV cancer as there is a pleural based lesion with pleural and small pericardial effusion.  She has appointment at oncology clinic with Dr. Julien Nordmann on 3/1  Ex-smoker, suspected COPD Schedule for pulmonary function test.  She is currently asymptomatic and does not need inhalers. We will continue to monitor her symptoms Congratulated on quitting smoking last week.  More then 1/2 the time of the 40 min visit was spent in counseling and/or coordination of care with the patient and family.  Plan/Recommendations: - PFTs - Appointment with Oncology  Marshell Garfinkel MD [facility] Pager [phone] 12/18/2017, 8:31 AM  CC: [name], MD

## 2017-12-19 ENCOUNTER — Other Ambulatory Visit: Payer: Self-pay | Admitting: *Deleted

## 2017-12-20 ENCOUNTER — Other Ambulatory Visit: Payer: Self-pay | Admitting: *Deleted

## 2017-12-20 ENCOUNTER — Encounter: Payer: Self-pay | Admitting: Radiation Oncology

## 2017-12-20 ENCOUNTER — Encounter: Payer: Self-pay | Admitting: Internal Medicine

## 2017-12-20 ENCOUNTER — Telehealth: Payer: Self-pay

## 2017-12-20 ENCOUNTER — Inpatient Hospital Stay: Payer: Medicare HMO | Attending: Internal Medicine | Admitting: Internal Medicine

## 2017-12-20 ENCOUNTER — Inpatient Hospital Stay: Payer: Medicare HMO

## 2017-12-20 VITALS — BP 86/65 | HR 88 | Temp 98.3°F | Resp 17 | Ht 63.0 in | Wt 136.6 lb

## 2017-12-20 DIAGNOSIS — R05 Cough: Secondary | ICD-10-CM

## 2017-12-20 DIAGNOSIS — C7931 Secondary malignant neoplasm of brain: Secondary | ICD-10-CM | POA: Insufficient documentation

## 2017-12-20 DIAGNOSIS — I10 Essential (primary) hypertension: Secondary | ICD-10-CM

## 2017-12-20 DIAGNOSIS — Z7982 Long term (current) use of aspirin: Secondary | ICD-10-CM | POA: Diagnosis not present

## 2017-12-20 DIAGNOSIS — Z720 Tobacco use: Secondary | ICD-10-CM

## 2017-12-20 DIAGNOSIS — Z801 Family history of malignant neoplasm of trachea, bronchus and lung: Secondary | ICD-10-CM | POA: Diagnosis not present

## 2017-12-20 DIAGNOSIS — R5383 Other fatigue: Secondary | ICD-10-CM

## 2017-12-20 DIAGNOSIS — Z923 Personal history of irradiation: Secondary | ICD-10-CM | POA: Diagnosis not present

## 2017-12-20 DIAGNOSIS — R634 Abnormal weight loss: Secondary | ICD-10-CM | POA: Insufficient documentation

## 2017-12-20 DIAGNOSIS — Z7189 Other specified counseling: Secondary | ICD-10-CM

## 2017-12-20 DIAGNOSIS — Z803 Family history of malignant neoplasm of breast: Secondary | ICD-10-CM | POA: Diagnosis not present

## 2017-12-20 DIAGNOSIS — C3490 Malignant neoplasm of unspecified part of unspecified bronchus or lung: Secondary | ICD-10-CM

## 2017-12-20 DIAGNOSIS — R079 Chest pain, unspecified: Secondary | ICD-10-CM | POA: Insufficient documentation

## 2017-12-20 DIAGNOSIS — Z9221 Personal history of antineoplastic chemotherapy: Secondary | ICD-10-CM | POA: Diagnosis not present

## 2017-12-20 DIAGNOSIS — F17210 Nicotine dependence, cigarettes, uncomplicated: Secondary | ICD-10-CM | POA: Diagnosis not present

## 2017-12-20 DIAGNOSIS — J9819 Other pulmonary collapse: Secondary | ICD-10-CM | POA: Diagnosis not present

## 2017-12-20 DIAGNOSIS — C3401 Malignant neoplasm of right main bronchus: Secondary | ICD-10-CM

## 2017-12-20 DIAGNOSIS — Z79899 Other long term (current) drug therapy: Secondary | ICD-10-CM | POA: Diagnosis not present

## 2017-12-20 DIAGNOSIS — Z800 Family history of malignant neoplasm of digestive organs: Secondary | ICD-10-CM | POA: Insufficient documentation

## 2017-12-20 DIAGNOSIS — C3491 Malignant neoplasm of unspecified part of right bronchus or lung: Secondary | ICD-10-CM

## 2017-12-20 LAB — CBC WITH DIFFERENTIAL (CANCER CENTER ONLY)
BASOS ABS: 0 10*3/uL (ref 0.0–0.1)
Basophils Relative: 1 %
EOS PCT: 2 %
Eosinophils Absolute: 0.2 10*3/uL (ref 0.0–0.5)
HEMATOCRIT: 35.9 % (ref 34.8–46.6)
Hemoglobin: 11.8 g/dL (ref 11.6–15.9)
LYMPHS ABS: 1.9 10*3/uL (ref 0.9–3.3)
LYMPHS PCT: 23 %
MCH: 31.6 pg (ref 25.1–34.0)
MCHC: 32.9 g/dL (ref 31.5–36.0)
MCV: 96 fL (ref 79.5–101.0)
MONO ABS: 0.5 10*3/uL (ref 0.1–0.9)
Monocytes Relative: 6 %
NEUTROS ABS: 5.5 10*3/uL (ref 1.5–6.5)
Neutrophils Relative %: 68 %
Platelet Count: 576 10*3/uL — ABNORMAL HIGH (ref 145–400)
RBC: 3.74 MIL/uL (ref 3.70–5.45)
RDW: 13 % (ref 11.2–14.5)
WBC Count: 8.1 10*3/uL (ref 3.9–10.3)

## 2017-12-20 LAB — CMP (CANCER CENTER ONLY)
ALT: 14 U/L (ref 0–55)
AST: 12 U/L (ref 5–34)
Albumin: 3.1 g/dL — ABNORMAL LOW (ref 3.5–5.0)
Alkaline Phosphatase: 75 U/L (ref 40–150)
Anion gap: 9 (ref 3–11)
BUN: 9 mg/dL (ref 7–26)
CO2: 28 mmol/L (ref 22–29)
CREATININE: 0.77 mg/dL (ref 0.60–1.10)
Calcium: 9.9 mg/dL (ref 8.4–10.4)
Chloride: 101 mmol/L (ref 98–109)
Glucose, Bld: 104 mg/dL (ref 70–140)
POTASSIUM: 3.9 mmol/L (ref 3.5–5.1)
Sodium: 138 mmol/L (ref 136–145)
TOTAL PROTEIN: 7.7 g/dL (ref 6.4–8.3)

## 2017-12-20 NOTE — Progress Notes (Signed)
[facility] Telephone:([phone]   Fax:([phone]  CONSULT NOTE  REFERRING PHYSICIAN: Dr. Maryland Pink  REASON FOR CONSULTATION:  67 years old African-American female recently diagnosed with lung cancer.  HPI Beth Alexander is a 67 y.o. female with past medical history significant for hypertension and left breast lumpectomy for benign lesion in January 2018.  The patient also has a long history for smoking.  She mentioned that she has been complaining of cough as well as right-sided chest pain and wheezes for 1 week.  She was seen by her primary care physician and chest x-ray was performed on 12/09/2017 and that showed right upper lobe collapse.  The patient was sent to [facility] for further evaluation and CT scan of the chest was performed on 12/10/2017 and it showed massive pretracheal lymphadenopathy with associated narrowing of the superior vena cava.  There was also right hilar mass encases the right pulmonary artery with occlusion of the right upper lobe artery.  The mass measured approximately 5.5 x 5.1 cm.  There was complete right lower lobe collapse due to occlusion of the right upper lobe bronchus by the right hilar mass.  There was also a lobulated right basilar pleural effusion.  There was also 2.5 cm mass of the right posterior pleura.  The patient was seen by Dr. Vaughan Browner during her hospitalization and she underwent video bronchoscopy with endobronchial ultrasound and biopsies on 12/11/2017 and the final pathology ([id number]) was consistent with squamous cell carcinoma. Dr. Vaughan Browner kindly referred the patient to me today for evaluation and recommendation regarding treatment of her condition. When seen today the patient is very anxious and worried about her recent diagnosis of the lung cancer.  She continues to have cough especially at nighttime and she takes Mucinex.  She denied having any current chest pain, shortness breath or hemoptysis.  She denied having  any nausea, vomiting, diarrhea or constipation.  She has no significant weight loss or night sweats.  She has no headache or visual changes. Family history significant for a mother with breast cancer, brother had colon cancer and another brother had lung cancer.  Father died from heart disease. The patient is single and has 2 sons.  She was accompanied today by her sister Beth Alexander.  The patient is currently retired and used to work in housekeeping.  She has a history for smoking 0.5 pack/day for around 45 years and she quit in February 2019.  She has no history of alcohol or drug abuse.  HPI  Past Medical History:  Diagnosis Date  . Hypertension   . Tobacco use     Past Surgical History:  Procedure Laterality Date  . BREAST LUMPECTOMY WITH RADIOACTIVE SEED LOCALIZATION Left 11/08/2016   Procedure: LEFT BREAST LUMPECTOMY WITH RADIOACTIVE SEED LOCALIZATION;  Surgeon: Donnie Mesa, MD;  Location: [place];  Service: General;  Laterality: Left;  Marland Kitchen VIDEO BRONCHOSCOPY WITH ENDOBRONCHIAL ULTRASOUND N/A 12/11/2017   Procedure: VIDEO BRONCHOSCOPY WITH ENDOBRONCHIAL ULTRASOUND;  Surgeon: Marshell Garfinkel, MD;  Location: [place];  Service: Pulmonary;  Laterality: N/A;    Family History  Problem Relation Age of Onset  . Breast cancer Mother   . Heart disease Father   . Lung cancer Brother   . Colon cancer Brother     Social History Social History   Tobacco Use  . Smoking status: Former Smoker    Packs/day: 0.50    Years: 15.00    Pack years: 7.50  Last attempt to quit: 12/10/2017    Years since quitting: 0.0  . Smokeless tobacco: Never Used  . Tobacco comment: quit smoking 12/10/17--12/18/17  Substance Use Topics  . Alcohol use: No  . Drug use: No    No Known Allergies  Current Outpatient Medications  Medication Sig Dispense Refill  . acetaminophen (TYLENOL) 500 MG tablet Take 500 mg by mouth every 6 (six) hours as needed for mild pain or headache.    . albuterol  (PROVENTIL HFA;VENTOLIN HFA) 108 (90 Base) MCG/ACT inhaler     . amLODipine (NORVASC) 5 MG tablet Take 1 tablet (5 mg total) by mouth daily. 30 tablet 5  . aspirin EC 81 MG tablet Take 81 mg by mouth daily.    Marland Kitchen dextromethorphan-guaiFENesin (MUCINEX DM) 30-600 MG 12hr tablet Take 1 tablet by mouth 2 (two) times daily as needed for cough.    Marland Kitchen HYDROcodone-acetaminophen (NORCO/VICODIN) 5-325 MG tablet Take 2 tablets by mouth every 4 (four) hours as needed. (Patient taking differently: Take 2 tablets by mouth every 4 (four) hours as needed for moderate pain. ) 10 tablet 0  . ibuprofen (ADVIL,MOTRIN) 200 MG tablet Take 200 mg by mouth every 6 (six) hours as needed for fever or moderate pain.    . Menthol-Methyl Salicylate (MUSCLE RUB) 10-15 % CREA Apply 1 application topically daily as needed for muscle pain.     . nicotine (NICODERM CQ - DOSED IN MG/24 HOURS) 14 mg/24hr patch Place 1 patch (14 mg total) onto the skin daily. 28 patch 0  . tiZANidine (ZANAFLEX) 4 MG tablet      No current facility-administered medications for this visit.     Review of Systems  Constitutional: positive for fatigue Eyes: negative Ears, nose, mouth, throat, and face: negative Respiratory: positive for cough Cardiovascular: negative Gastrointestinal: negative Genitourinary:negative Integument/breast: negative Hematologic/lymphatic: negative Musculoskeletal:negative Neurological: negative Behavioral/Psych: positive for anxiety Endocrine: negative Allergic/Immunologic: negative  Physical Exam  LEX:NTZGY, healthy, no distress, well nourished, well developed and anxious SKIN: skin color, texture, turgor are normal, no rashes or significant lesions HEAD: Normocephalic, No masses, lesions, tenderness or abnormalities EYES: normal, PERRLA, Conjunctiva are pink and non-injected EARS: External ears normal, Canals clear OROPHARYNX:no exudate, no erythema and lips, buccal mucosa, and tongue normal  NECK: supple, no  adenopathy, no JVD LYMPH:  no palpable lymphadenopathy, no hepatosplenomegaly BREAST:not examined LUNGS: decreased breath sounds, expiratory wheezes bilaterally HEART: regular rate & rhythm, no murmurs and no gallops ABDOMEN:abdomen soft, non-tender, normal bowel sounds and no masses or organomegaly BACK: Back symmetric, no curvature., No CVA tenderness EXTREMITIES:no joint deformities, effusion, or inflammation, no edema, no skin discoloration  NEURO: alert & oriented x 3 with fluent speech, no focal motor/sensory deficits  PERFORMANCE STATUS: ECOG 1  LABORATORY DATA: Lab Results  Component Value Date   WBC 8.1 12/20/2017   HGB 11.3 (L) 12/10/2017   HCT 35.9 12/20/2017   MCV 96.0 12/20/2017   PLT 576 (H) 12/20/2017      Chemistry      Component Value Date/Time   NA 135 12/10/2017 0257   K 3.4 (L) 12/10/2017 0257   CL 99 (L) 12/10/2017 0257   CO2 22 12/10/2017 0257   BUN <5 (L) 12/10/2017 0257   CREATININE 0.76 12/10/2017 0257      Component Value Date/Time   CALCIUM 8.9 12/10/2017 0257       RADIOGRAPHIC STUDIES: Dg Chest 2 View  Result Date: 12/09/2017 CLINICAL DATA:  Cough and right-sided chest pain for 1 week.  EXAM: CHEST  2 VIEW COMPARISON:  None. FINDINGS: The heart size is normal. There is mild perihilar peribronchial thickening. There is right-sided volume loss secondary to right upper lobe collapse. The findings are suspicious for right upper lobe/hilar lesion. Small right pleural effusion. Left lung is essentially clear. No pulmonary edema. IMPRESSION: Right upper lobe collapse. Recommend further evaluation CT of the chest. Intravenous contrast is recommended unless contraindicated. Electronically Signed   By: Nolon Nations M.D.   On: 12/09/2017 20:42   Ct Chest W Contrast  Result Date: 12/10/2017 CLINICAL DATA:  Chest pain or shortness of breath. Right upper lobe collapse. EXAM: CT CHEST WITH CONTRAST TECHNIQUE: Multidetector CT imaging of the chest was  performed during intravenous contrast administration. 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 Coronary artery calcification. Small pericardial effusion. Normal heart size. Calcific atherosclerosis of the aorta. Mediastinum/Nodes: Massive pretracheal lymphadenopathy with associated narrowing of the superior vena cava. Right hilar mass encases the right pulmonary artery, with occlusion of the right upper lobe artery. The mass measures approximately 5.5 x 5.1 cm. Lungs/Pleura: There is complete right upper lobe collapse due to occlusion of the right upper lobe bronchus by a right hilar mass. Right middle lobe is clear. There is a lobulated right basilar pleural effusion. The left lung is clear suspected 2.5 cm mass of the right posterior pleura. Upper Abdomen: Hypoattenuation of the anterior left kidney upper pole, possibly artifactual. Otherwise normal visualized upper abdominal organs. Musculoskeletal: No acute osseous abnormality. No lytic or blastic lesions. IMPRESSION: 1. Large right hilar mass encasing the right pulmonary artery and occluding the right upper lobe bronchus with associated complete right upper lobe collapse. The right upper lobe pulmonary artery also likely occluded. 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 2. General lack of enhancement of the right upper lobe parenchyma may be due to necrosis or vascular compromise. Necrotic tumor within atelectatic lung is also a possibility. 3. Right pleural effusion with area of rounded hyperattenuation along the posterior pleura concerning for a pleural based mass. 4. Narrowing of the superior vena cava secondary to mass effect from right hilar mass and confluent mediastinal adenopathy. 5. Small pericardial effusion. 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 Electronically Signed   By: Ulyses Jarred M.D.   On: 12/10/2017 02:17   Dg Chest Port 1 View  Result Date: 12/11/2017 CLINICAL DATA:  67 year old female status post bronchoscopic biopsy. EXAM: PORTABLE CHEST 1 VIEW COMPARISON:  Chest CT 12/10/2017 and earlier. FINDINGS: Portable AP semi upright view at 1625 hrs. Stable lung volumes. Unchanged masslike opacity throughout the right upper lung, inseparable from the right hilum. No pneumothorax. Trace right pleural effusion appears stable. Stable cardiac size and mediastinal contours. No new pulmonary opacity. Mildly to moderately increased gaseous distension of the stomach. IMPRESSION: 1. No adverse features status post bronchoscopic biopsy. 2. No new cardiopulmonary abnormality. 3. Mild to moderate new gaseous distension of the stomach. Electronically Signed   By: Genevie Ann M.D.   On: 12/11/2017 16:50    ASSESSMENT: This is a very pleasant 67 years old African-American female recently diagnosed with at least stage IIIa (T3, N2, M0) non-small cell lung cancer, squamous cell carcinoma presented with large right hilar mass with encasement of the right pulmonary artery as well as superior vena cava and right upper lobe collapse in addition to precarinal lymphadenopathy diagnosed in  February 2019.   PLAN: I had a lengthy discussion with the patient and her sister today about her current disease stage, prognosis and treatment options. I personally and independently reviewed the scan images and discussed the results and showed the images to the patient and her sister. I recommended for the patient to complete the staging workup by ordering a PET scan as well as MRI of the brain to rule out any metastatic disease. I will also refer the patient to radiation oncology for evaluation consideration of radiotherapy to the enlarging right hilar mass either as a palliative option or curative option concurrent with chemotherapy if the patient has no  evidence of metastatic disease. I will arrange for the patient to come back for follow-up visit in around 10 days for reevaluation and more detailed discussion of her chemotherapy option. She was advised to call immediately if she has any concerning symptoms in the interval. The patient voices understanding of current disease status and treatment options and is in agreement with the current care plan.  All questions were answered. The patient knows to call the clinic with any problems, questions or concerns. We can certainly see the patient much sooner if necessary.  Thank you so much for allowing me to participate in the care of Beth Alexander. I will continue to follow up the patient with you and assist in her care.  I spent 40 minutes counseling the patient face to face. The total time spent in the appointment was 60 minutes.  Disclaimer: This note was dictated with voice recognition software. Similar sounding words can inadvertently be transcribed and may not be corrected upon review.   Eilleen Kempf December 20, 2017, 9:19 AM

## 2017-12-20 NOTE — Telephone Encounter (Signed)
Printed avs and calender of upcoming appointment.per 3/1 los

## 2017-12-23 ENCOUNTER — Telehealth: Payer: Self-pay | Admitting: *Deleted

## 2017-12-23 NOTE — Telephone Encounter (Signed)
Oncology Nurse Navigator Documentation  Oncology Nurse Navigator Flowsheets 12/23/2017  Navigator Location [facility]  Navigator Encounter Type Telephone/I followed up on Beth Alexander's schedule. Her scans have not been scheduled. I called central scheduling to get an appt for scans. I then called patient with an update on scans, location, date, and pre-procedure instructions. She verbalized understanding.   Treatment Phase Pre-Tx/Tx Discussion  Barriers/Navigation Needs Coordination of Care;Education  Education Other  Interventions Coordination of Care;Education  Coordination of Care Appts  Education Method Verbal  Acuity Level 2  Acuity Level 2 Assistance expediting appointments  Time Spent with Patient 30

## 2017-12-27 ENCOUNTER — Ambulatory Visit (HOSPITAL_COMMUNITY)
Admission: RE | Admit: 2017-12-27 | Discharge: 2017-12-27 | Disposition: A | Discharge status: Home / Self Care | Payer: Medicare HMO | Source: Ambulatory Visit | Attending: Internal Medicine | Admitting: Internal Medicine

## 2017-12-27 DIAGNOSIS — J90 Pleural effusion, not elsewhere classified: Secondary | ICD-10-CM | POA: Diagnosis not present

## 2017-12-27 DIAGNOSIS — C3490 Malignant neoplasm of unspecified part of unspecified bronchus or lung: Secondary | ICD-10-CM | POA: Diagnosis present

## 2017-12-27 LAB — GLUCOSE, CAPILLARY: Glucose-Capillary: 97 mg/dL (ref 65–99)

## 2017-12-27 MED ORDER — GADOBENATE DIMEGLUMINE 529 MG/ML IV SOLN
15.0000 mL | Freq: Once | INTRAVENOUS | Status: AC | PRN
  Administered 2017-12-27: 13 mL via INTRAVENOUS

## 2017-12-27 MED ORDER — FLUDEOXYGLUCOSE F - 18 (FDG) INJECTION
6.8700 | Freq: Once | INTRAVENOUS | Status: AC | PRN
  Administered 2017-12-27: 6.87 via INTRAVENOUS

## 2017-12-30 ENCOUNTER — Other Ambulatory Visit: Payer: Self-pay | Admitting: Oncology

## 2017-12-30 DIAGNOSIS — C3491 Malignant neoplasm of unspecified part of right bronchus or lung: Secondary | ICD-10-CM

## 2017-12-30 NOTE — Progress Notes (Signed)
Thoracic Location of Tumor / Histology: Squamous Cell Carcinoma of the Right Upper Lobe  Patient presented with cough, right sided chest pain, and wheezing x 1 week.  PCP visit on 12/09/2017 and chest x ray done showing right upper lobe collapse.  CT 12/10/2017: Massive pretracheal lymphadenopathy with associated narrowing of the superior vena cava, right hilar mass encases the right pulmonary artery with occlusion of the right upper lobe artery, measuring 5.5 x 5.1 cm.  Also noted was a 2.5 cm mass of the right posterior pleura.  Biopsies of the right upper lobe on 12/11/2017 revealed:   Tobacco/Marijuana/Snuff/ETOH use: Quit smoking in February 2019.  Past/Anticipated interventions by cardiothoracic surgery, if any:   Past/Anticipated interventions by medical oncology, if any: Per note from Dr. Julien Nordmann 12/20/2017: I recommended for the patient to complete the staging workup by ordering a PET scan as well as MRI of the brain to rule out any metastatic disease. I will also refer the patient to radiation oncology for evaluation consideration of radiotherapy to the enlarging right hilar mass either as a palliative option or curative option concurrent with chemotherapy if the patient has no evidence of metastatic disease. I will arrange for the patient to come back for follow-up visit in around 10 days for reevaluation and more detailed discussion of her chemotherapy option.  12/31/2017- Phone call received updating Dr. Peggye Ley plan after reviewing her PET scan and MRI. Possible SRS to the brain and palliative radiation for the R Lung.   Signs/Symptoms  Weight changes, if any: 13 pounds over the last month  Respiratory complaints, if any: No  Hemoptysis, if any: No  Pain issues, if any:  Right side, relieved with tylenol  BP 127/69 (BP Location: Right Arm, Patient Position: Sitting, Cuff Size: Normal)   Pulse 81   Temp 97.7 F (36.5 C) (Oral)   Resp 18   Ht 5\' 3"  (1.6 m)   Wt 133 lb  6.4 oz (60.5 kg)   SpO2 100%   BMI 23.63 kg/m    Wt Readings from Last 3 Encounters:  12/31/17 133 lb 6.4 oz (60.5 kg)  12/31/17 133 lb 11.2 oz (60.6 kg)  12/20/17 136 lb 9.6 oz (62 kg)    SAFETY ISSUES:  Prior radiation? No  Pacemaker/ICD?  No  Possible current pregnancy? No  Is the patient on methotrexate? No  Current Complaints / other details:

## 2017-12-31 ENCOUNTER — Encounter: Payer: Self-pay | Admitting: Radiation Oncology

## 2017-12-31 ENCOUNTER — Ambulatory Visit
Admission: RE | Admit: 2017-12-31 | Discharge: 2017-12-31 | Disposition: A | Discharge status: Home / Self Care | Payer: Medicare HMO | Source: Ambulatory Visit | Attending: Radiation Oncology | Admitting: Radiation Oncology

## 2017-12-31 ENCOUNTER — Encounter: Payer: Self-pay | Admitting: Oncology

## 2017-12-31 ENCOUNTER — Inpatient Hospital Stay: Payer: Medicare HMO

## 2017-12-31 ENCOUNTER — Telehealth: Payer: Self-pay | Admitting: Oncology

## 2017-12-31 ENCOUNTER — Inpatient Hospital Stay (HOSPITAL_BASED_OUTPATIENT_CLINIC_OR_DEPARTMENT_OTHER): Payer: Medicare HMO | Admitting: Oncology

## 2017-12-31 ENCOUNTER — Other Ambulatory Visit: Payer: Self-pay

## 2017-12-31 VITALS — BP 123/72 | HR 99 | Temp 98.6°F | Resp 17 | Ht 63.0 in | Wt 133.7 lb

## 2017-12-31 VITALS — BP 127/69 | HR 81 | Temp 97.7°F | Resp 18 | Ht 63.0 in | Wt 133.4 lb

## 2017-12-31 DIAGNOSIS — Z800 Family history of malignant neoplasm of digestive organs: Secondary | ICD-10-CM | POA: Diagnosis not present

## 2017-12-31 DIAGNOSIS — Z7189 Other specified counseling: Secondary | ICD-10-CM | POA: Insufficient documentation

## 2017-12-31 DIAGNOSIS — Z803 Family history of malignant neoplasm of breast: Secondary | ICD-10-CM | POA: Insufficient documentation

## 2017-12-31 DIAGNOSIS — Z79899 Other long term (current) drug therapy: Secondary | ICD-10-CM

## 2017-12-31 DIAGNOSIS — R5383 Other fatigue: Secondary | ICD-10-CM

## 2017-12-31 DIAGNOSIS — Z808 Family history of malignant neoplasm of other organs or systems: Secondary | ICD-10-CM | POA: Insufficient documentation

## 2017-12-31 DIAGNOSIS — J9819 Other pulmonary collapse: Secondary | ICD-10-CM

## 2017-12-31 DIAGNOSIS — F40240 Claustrophobia: Secondary | ICD-10-CM | POA: Diagnosis not present

## 2017-12-31 DIAGNOSIS — F17210 Nicotine dependence, cigarettes, uncomplicated: Secondary | ICD-10-CM | POA: Diagnosis not present

## 2017-12-31 DIAGNOSIS — Z7982 Long term (current) use of aspirin: Secondary | ICD-10-CM | POA: Insufficient documentation

## 2017-12-31 DIAGNOSIS — Z8249 Family history of ischemic heart disease and other diseases of the circulatory system: Secondary | ICD-10-CM | POA: Insufficient documentation

## 2017-12-31 DIAGNOSIS — R079 Chest pain, unspecified: Secondary | ICD-10-CM

## 2017-12-31 DIAGNOSIS — C3411 Malignant neoplasm of upper lobe, right bronchus or lung: Secondary | ICD-10-CM | POA: Insufficient documentation

## 2017-12-31 DIAGNOSIS — F419 Anxiety disorder, unspecified: Secondary | ICD-10-CM | POA: Insufficient documentation

## 2017-12-31 DIAGNOSIS — R05 Cough: Secondary | ICD-10-CM

## 2017-12-31 DIAGNOSIS — C3491 Malignant neoplasm of unspecified part of right bronchus or lung: Secondary | ICD-10-CM

## 2017-12-31 DIAGNOSIS — Z801 Family history of malignant neoplasm of trachea, bronchus and lung: Secondary | ICD-10-CM | POA: Diagnosis not present

## 2017-12-31 DIAGNOSIS — I10 Essential (primary) hypertension: Secondary | ICD-10-CM | POA: Insufficient documentation

## 2017-12-31 DIAGNOSIS — C7931 Secondary malignant neoplasm of brain: Secondary | ICD-10-CM

## 2017-12-31 DIAGNOSIS — C3401 Malignant neoplasm of right main bronchus: Secondary | ICD-10-CM

## 2017-12-31 DIAGNOSIS — Z87891 Personal history of nicotine dependence: Secondary | ICD-10-CM | POA: Insufficient documentation

## 2017-12-31 LAB — CBC WITH DIFFERENTIAL (CANCER CENTER ONLY)
Basophils Absolute: 0 10*3/uL (ref 0.0–0.1)
Basophils Relative: 1 %
EOS ABS: 0.2 10*3/uL (ref 0.0–0.5)
EOS PCT: 3 %
HCT: 35.1 % (ref 34.8–46.6)
Hemoglobin: 11.6 g/dL (ref 11.6–15.9)
LYMPHS ABS: 1.5 10*3/uL (ref 0.9–3.3)
LYMPHS PCT: 24 %
MCH: 31.5 pg (ref 25.1–34.0)
MCHC: 33 g/dL (ref 31.5–36.0)
MCV: 95.4 fL (ref 79.5–101.0)
MONOS PCT: 7 %
Monocytes Absolute: 0.4 10*3/uL (ref 0.1–0.9)
Neutro Abs: 4.2 10*3/uL (ref 1.5–6.5)
Neutrophils Relative %: 65 %
PLATELETS: 410 10*3/uL — AB (ref 145–400)
RBC: 3.68 MIL/uL — AB (ref 3.70–5.45)
RDW: 13 % (ref 11.2–14.5)
WBC: 6.4 10*3/uL (ref 3.9–10.3)

## 2017-12-31 LAB — CMP (CANCER CENTER ONLY)
ALBUMIN: 3.2 g/dL — AB (ref 3.5–5.0)
ALT: 11 U/L (ref 0–55)
AST: 11 U/L (ref 5–34)
Alkaline Phosphatase: 74 U/L (ref 40–150)
Anion gap: 10 (ref 3–11)
BUN: 8 mg/dL (ref 7–26)
CHLORIDE: 102 mmol/L (ref 98–109)
CO2: 27 mmol/L (ref 22–29)
CREATININE: 0.76 mg/dL (ref 0.60–1.10)
Calcium: 10 mg/dL (ref 8.4–10.4)
GFR, Est AFR Am: >60 mL/min (ref >60)
GFR, Estimated: >60 mL/min (ref >60)
Glucose, Bld: 122 mg/dL (ref 70–140)
POTASSIUM: 3.7 mmol/L (ref 3.5–5.1)
Sodium: 139 mmol/L (ref 136–145)
Total Bilirubin: 0.2 mg/dL (ref 0.2–1.2)
Total Protein: 7.8 g/dL (ref 6.4–8.3)

## 2017-12-31 MED ORDER — LORAZEPAM 0.5 MG PO TABS: ORAL_TABLET | ORAL | 0 refills | Status: DC

## 2017-12-31 MED ORDER — DEXAMETHASONE 4 MG PO TABS: 4.0000 mg | ORAL_TABLET | Freq: Two times a day (BID) | ORAL | 0 refills | Status: DC

## 2017-12-31 NOTE — Addendum Note (Signed)
Encounter addended by: Cori Razor, RN on: 12/31/2017 10:54 AM  Actions taken: Visit diagnoses modified

## 2017-12-31 NOTE — Telephone Encounter (Signed)
Scheduled appt per 3/12 los - Gave patient aVS and calender per los. -  

## 2017-12-31 NOTE — Progress Notes (Addendum)
Radiation Oncology         [phone] ________________________________  Name: Beth Alexander        MRN: [id number]  Date of Service: 12/31/2017 DOB: Jun 18, 1951  HX:[id number], Christean Grief, MD  Curt Bears, MD     REFERRING PHYSICIAN: Curt Bears, MD   DIAGNOSIS: The encounter diagnosis was Squamous cell carcinoma of right lung [facility]).   HISTORY OF PRESENT ILLNESS: Beth Alexander is a 67 y.o. female seen at the request of Dr. Julien Nordmann for a new diagnosis of lung cancer. The patient was found to have a mediastinal mass and RUL collapse on a CXR ordered by her PCP due to cough and chest wall pain. She proceeded with a CT upon being admitted on 12/09/17 following that CXR. Her CT chest revealed massive pretracheal adenopathy with narrowing of the SVC. Her tumor measured 5.5 x 5.1 cm. She met with Dr. Vaughan Browner and bronchoscopy on 12/11/17 revealed a squamous cell carcinoma of the lung, and she met with Dr. Julien Nordmann. A PET scan on 12/27/17 revealed hypermetabolism in the RUL, with several other nodules nearby, one of which was posteriorly lying and concerning for invasion of the chest wall. There was also activity within the retroperitoneal nodes, bilateral renal uptake as well was seen. An MRI of the brain on the same day also revealed a 2.3 cm lesion in the right thalamus. She comes today to discus options of radiotherapy to the chest and brain. She is going to begin systemic therapy as well with Dr. Julien Nordmann in the near future, but does not have a start date.    PREVIOUS RADIATION THERAPY: No   PAST MEDICAL HISTORY:  Past Medical History:  Diagnosis Date  . Hypertension   . Tobacco use        PAST SURGICAL HISTORY: Past Surgical History:  Procedure Laterality Date  . BREAST LUMPECTOMY WITH RADIOACTIVE SEED LOCALIZATION Left 11/08/2016   Procedure: LEFT BREAST LUMPECTOMY WITH RADIOACTIVE SEED LOCALIZATION;  Surgeon: Donnie Mesa, MD;  Location: [place];  Service: General;   Laterality: Left;  Marland Kitchen VIDEO BRONCHOSCOPY WITH ENDOBRONCHIAL ULTRASOUND N/A 12/11/2017   Procedure: VIDEO BRONCHOSCOPY WITH ENDOBRONCHIAL ULTRASOUND;  Surgeon: Marshell Garfinkel, MD;  Location: [place];  Service: Pulmonary;  Laterality: N/A;     FAMILY HISTORY:  Family History  Problem Relation Age of Onset  . Breast cancer Mother   . Heart disease Father   . Lung cancer Brother   . Colon cancer Brother      SOCIAL HISTORY:  reports that she quit smoking about 3 weeks ago. She has a 7.50 pack-year smoking history. she has never used smokeless tobacco. She reports that she does not drink alcohol or use drugs. The patient is divorced. She has adult children and reports she has the support of one of them. She is accompanied by her sister and nephew. She lives in [place], and is from this area originally. She attends church at [place] near [employer].   ALLERGIES: Patient has no known allergies.   MEDICATIONS:  Current Outpatient Medications  Medication Sig Dispense Refill  . acetaminophen (TYLENOL) 500 MG tablet Take 500 mg by mouth every 6 (six) hours as needed for mild pain or headache.    Marland Kitchen amLODipine (NORVASC) 5 MG tablet Take 1 tablet (5 mg total) by mouth daily. 30 tablet 5  . aspirin EC 81 MG tablet Take 81 mg by mouth daily.    Marland Kitchen dexamethasone (DECADRON) 4 MG tablet Take 1 tablet (4 mg  total) by mouth 2 (two) times daily with a meal. 28 tablet 0  . dextromethorphan-guaiFENesin (MUCINEX DM) 30-600 MG 12hr tablet Take 1 tablet by mouth 2 (two) times daily as needed for cough.    Marland Kitchen ibuprofen (ADVIL,MOTRIN) 200 MG tablet Take 200 mg by mouth every 6 (six) hours as needed for fever or moderate pain.    . nicotine (NICODERM CQ - DOSED IN MG/24 HOURS) 14 mg/24hr patch Place 1 patch (14 mg total) onto the skin daily. 28 patch 0  . albuterol (PROVENTIL HFA;VENTOLIN HFA) 108 (90 Base) MCG/ACT inhaler     . HYDROcodone-acetaminophen (NORCO/VICODIN) 5-325 MG tablet Take 2 tablets by  mouth every 4 (four) hours as needed. (Patient not taking: Reported on 12/31/2017) 10 tablet 0  . LORazepam (ATIVAN) 0.5 MG tablet 1 tablet po 30 minutes prior to radiation or MRI, or every 4-6 hours prn anxiety 60 tablet 0  . Menthol-Methyl Salicylate (MUSCLE RUB) 10-15 % CREA Apply 1 application topically daily as needed for muscle pain.     Marland Kitchen tiZANidine (ZANAFLEX) 4 MG tablet      No current facility-administered medications for this encounter.      REVIEW OF SYSTEMS: On review of systems, the patient reports that she is doing okay but is very scared by the news of her disease. She reports shortness of breath at rest. She denies any edema of the RUE or visible venous changes along the chest wall. She reports chest wall discomfort laterally of the right chest wall. She has shortness of breath with rest, and with exertion. She has had a non productive cough. She does not have fevers, chills, night sweats, unintended weight changes. She denies any bowel or bladder disturbances, and denies abdominal pain, nausea or vomiting. She denies any other musculoskeletal or joint aches or pains. A complete review of systems is obtained and is otherwise negative.     PHYSICAL EXAM:  Wt Readings from Last 3 Encounters:  12/31/17 133 lb 6.4 oz (60.5 kg)  12/31/17 133 lb 11.2 oz (60.6 kg)  12/20/17 136 lb 9.6 oz (62 kg)   Temp Readings from Last 3 Encounters:  12/31/17 97.7 F (36.5 C) (Oral)  12/31/17 98.6 F (37 C) (Oral)  12/20/17 98.3 F (36.8 C) (Oral)   BP Readings from Last 3 Encounters:  12/31/17 127/69  12/31/17 123/72  12/20/17 (!) 86/65   Pulse Readings from Last 3 Encounters:  12/31/17 81  12/31/17 99  12/20/17 88   Pain Assessment Pain Score: 7 (Right side, takes tylenol )/10  In general this is a well appearing African American female in no acute distress. She is alert and oriented x4 and appropriate throughout the examination. HEENT reveals that the patient is normocephalic,  atraumatic. EOMs are intact. PERRLA. Skin is intact without any evidence of gross lesions. Cardiovascular exam reveals a regular rate and rhythm, no clicks rubs or murmurs are auscultated. She has occasional PVCs noted after 4-5 beats. Chest is clear to auscultation in the left lung field. There are no audible breath sounds in the upper right lung field, but below the scapula there are breath sounds noted. Lymphatic assessment is performed and does not reveal any adenopathy in the cervical, supraclavicular, axillary, or inguinal chains.   ECOG = 1  0 - Asymptomatic (Fully active, able to carry on all predisease activities without restriction)  1 - Symptomatic but completely ambulatory (Restricted in physically strenuous activity but ambulatory and able to carry out work of a light or  sedentary nature. For example, light housework, office work)  2 - Symptomatic, <50% in bed during the day (Ambulatory and capable of all self care but unable to carry out any work activities. Up and about more than 50% of waking hours)  3 - Symptomatic, >50% in bed, but not bedbound (Capable of only limited self-care, confined to bed or chair 50% or more of waking hours)  4 - Bedbound (Completely disabled. Cannot carry on any self-care. Totally confined to bed or chair)  5 - Death   Eustace Pen MM, Creech RH, Tormey DC, et al. [phone]). "Toxicity and response criteria of the [facility]". [place] Oncol. 5 (6): 649-55    LABORATORY DATA:  Lab Results  Component Value Date   WBC 6.4 12/31/2017   HGB 11.3 (L) 12/10/2017   HCT 35.1 12/31/2017   MCV 95.4 12/31/2017   PLT 410 (H) 12/31/2017   Lab Results  Component Value Date   NA 139 12/31/2017   K 3.7 12/31/2017   CL 102 12/31/2017   CO2 27 12/31/2017   Lab Results  Component Value Date   ALT 11 12/31/2017   AST 11 12/31/2017   ALKPHOS 74 12/31/2017   BILITOT 0.2 12/31/2017      RADIOGRAPHY: Dg Chest 2 View  Result Date:  12/09/2017 CLINICAL DATA:  Cough and right-sided chest pain for 1 week. EXAM: CHEST  2 VIEW COMPARISON:  None. FINDINGS: The heart size is normal. There is mild perihilar peribronchial thickening. There is right-sided volume loss secondary to right upper lobe collapse. The findings are suspicious for right upper lobe/hilar lesion. Small right pleural effusion. Left lung is essentially clear. No pulmonary edema. IMPRESSION: Right upper lobe collapse. Recommend further evaluation CT of the chest. Intravenous contrast is recommended unless contraindicated. Electronically Signed   By: Nolon Nations M.D.   On: 12/09/2017 20:42   Ct Chest W Contrast  Result Date: 12/10/2017 CLINICAL DATA:  Chest pain or shortness of breath. Right upper lobe collapse. EXAM: CT CHEST WITH CONTRAST TECHNIQUE: Multidetector CT imaging of the chest was performed during intravenous contrast administration. 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 Coronary artery calcification. Small pericardial effusion. Normal heart size. Calcific atherosclerosis of the aorta. Mediastinum/Nodes: Massive pretracheal lymphadenopathy with associated narrowing of the superior vena cava. Right hilar mass encases the right pulmonary artery, with occlusion of the right upper lobe artery. The mass measures approximately 5.5 x 5.1 cm. Lungs/Pleura: There is complete right upper lobe collapse due to occlusion of the right upper lobe bronchus by a right hilar mass. Right middle lobe is clear. There is a lobulated right basilar pleural effusion. The left lung is clear suspected 2.5 cm mass of the right posterior pleura. Upper Abdomen: Hypoattenuation of the anterior left kidney upper pole, possibly artifactual. Otherwise normal visualized upper abdominal organs. Musculoskeletal: No acute osseous abnormality. No lytic or  blastic lesions. IMPRESSION: 1. Large right hilar mass encasing the right pulmonary artery and occluding the right upper lobe bronchus with associated complete right upper lobe collapse. The right upper lobe pulmonary artery also likely occluded. 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 2. General lack of enhancement of the right upper lobe parenchyma may be due to necrosis or vascular compromise. Necrotic tumor within atelectatic lung is also a possibility. 3. Right pleural effusion with area of rounded hyperattenuation along the posterior pleura concerning for a  pleural based mass. 4. Narrowing of the superior vena cava secondary to mass effect from right hilar mass and confluent mediastinal adenopathy. 5. Small pericardial effusion. 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 Electronically Signed   By: Ulyses Jarred M.D.   On: 12/10/2017 02:17   Mr Jeri Cos HM Contrast  Result Date: 12/27/2017 CLINICAL DATA:  New diagnosis lung cancer.  Staging. EXAM: MRI HEAD WITHOUT AND WITH CONTRAST TECHNIQUE: Multiplanar, multiecho pulse sequences of the brain and surrounding structures were obtained without and with intravenous contrast. CONTRAST:  51m MULTIHANCE GADOBENATE DIMEGLUMINE 529 MG/ML IV SOLN COMPARISON:  None. FINDINGS: Brain: The brainstem and cerebellum are normal. Within the right thalamus, there is a necrotic metastasis measuring 2.3 cm in diameter. Very minimal surrounding edema. No second lesion is identified. There are ordinary mild chronic small-vessel ischemic changes of the hemispheric white matter. No cortical abnormality. No hydrocephalus or extra-axial collection. Vascular: Major vessels at the base of the brain show flow. Skull and upper cervical spine: Negative Sinuses/Orbits: Clear/normal. Old medial wall blowout fracture on the right. Other: None significant IMPRESSION: 2.3 cm centrally necrotic metastasis within the  right thalamus. No significant edema reaction. No second lesion. Electronically Signed   By: MNelson ChimesM.D.   On: 12/27/2017 13:05   Nm Pet Image Initial (pi) Skull Base To Thigh  Result Date: 12/27/2017 CLINICAL DATA:  Initial treatment strategy for lung cancer. EXAM: NUCLEAR MEDICINE PET SKULL BASE TO THIGH TECHNIQUE: His 6.87 mCi F-18 FDG was injected intravenously. Full-ring PET imaging was performed from the skull base to thigh after the radiotracer. CT data was obtained and used for attenuation correction and anatomic localization. Fasting blood glucose: 97 mg/dl Mediastinal blood pool activity: SUV max 2.23 COMPARISON:  None. FINDINGS: NECK: No hypermetabolic lymph nodes in the neck. Incidental CT findings: none CHEST: Large hypermetabolic mass within the right upper lobe invading the right hilum and right paratracheal region measuring 6.5 cm with an SUV max equal to 20.64. The tumor has mass effect upon the right lateral and ventral wall of the trachea. There is encasement and complete occlusion of the right upper lobe bronchus with postobstructive consolidation of the right upper lobe. Two separate hypermetabolic lesions are identified within the right upper lobe. Central right upper lobe lesion measures 2.5 cm and has an SUV max equal to 17.36. Within the posterior right upper lobe there is a 2.1 cm lesion within SUV max of 26. Associated posterior chest wall invasion is suspected, image 49/4. There is a small right pleural effusion. A hypermetabolic pleural base nodule which extends into the posterior chest wall is noted. This measures approximately 3.3 cm and has an SUV max equal to 24. Associated erosive changes along the right ninth rib is identified, image 82/4. Incidental CT findings: There is moderate cardiac enlargement and aortic atherosclerosis. Calcification in the LAD, left circumflex and RCA coronary artery noted. ABDOMEN/PELVIS: No abnormal hypermetabolic activity within the liver,  pancreas, adrenal glands, or spleen. There is a hypermetabolic mass arising from the upper pole of right kidney corresponding to an area of relative hypoattenuation on CT from 12/10/2017. This mass measures approximately 3.1 cm and has an SUV max of 16.17. Favor metastatic disease. Similarly there is a small cortical based hypermetabolic lesion arising from the inferior pole of the left kidney with anSUV max of 9.6. Small retrocaval lymph node is hypermetabolic with an SUV max of 6.5. Incidental CT findings: none SKELETON: No focal hypermetabolic  activity to suggest skeletal metastasis. Incidental CT findings: none IMPRESSION: 1. Large mass involving the right paratracheal region, right hilar and medial right upper lobe identified compatible with primary bronchogenic carcinoma. Complete occlusion of the right upper lobe bronchus with postobstructive pneumonitis is identified. There are 2 additional separate lesions within the right upper lobe. One of these appears to invade the posterior chest wall. Additionally, there is a right pleural effusion and a pleural base mass at the posterior right lung base which also invades the posterior chest wall. Bilateral renal metastasis are suspected as well as left retroperitoneal nodal metastasis. Assuming a non-small cell histology imaging findings are compatible with a [id number] lesion or stage IV disease. Electronically Signed   By: Kerby Moors M.D.   On: 12/27/2017 13:30   Dg Chest Port 1 View  Result Date: 12/11/2017 CLINICAL DATA:  67 year old female status post bronchoscopic biopsy. EXAM: PORTABLE CHEST 1 VIEW COMPARISON:  Chest CT 12/10/2017 and earlier. FINDINGS: Portable AP semi upright view at 1625 hrs. Stable lung volumes. Unchanged masslike opacity throughout the right upper lung, inseparable from the right hilum. No pneumothorax. Trace right pleural effusion appears stable. Stable cardiac size and mediastinal contours. No new pulmonary opacity. Mildly to  moderately increased gaseous distension of the stomach. IMPRESSION: 1. No adverse features status post bronchoscopic biopsy. 2. No new cardiopulmonary abnormality. 3. Mild to moderate new gaseous distension of the stomach. Electronically Signed   By: Genevie Ann M.D.   On: 12/11/2017 16:50       IMPRESSION/PLAN: 1. 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 Dr. Lisbeth Renshaw discusses the pathology findings and reviews the nature of her imaging findings and offers the course of palliative radiotherapy to the RUL including a posterior lesion near the right rib, as well as stereotactic radiosurgery [facility]). We discussed the risks, benefits, short, and long term effects of radiotherapy, and the patient is interested in proceeding. Dr. Lisbeth Renshaw discusses the delivery and logistics of radiotherapy and anticipates a course of 2 weeks to the sites in the chest, and 1 fraction with SRS. We reviewed the need to meet with neurosurgery prior to simulation. We also discussed the role of a 3T MRI and would need to coordinate this. She will meet with our brain navigator as well to review these recommendations. We also discussed considering our open mask trial for [facility] treatment set up. 2. 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 We discussed the use of Ativan for her symptoms and I will call this into her pharmacy. Precautions were given. We will also ask Social Work to meet with the patient to help her through this process.  3. Chest wall pain. We would anticipate improvement in her symptoms with radiotherapy, and she also has norco as needed to consider using.   In a visit lasting 60 minutes, greater than 50% of the time was spent face to face discussing her case, and coordinating the patient's care.  The above documentation reflects my direct findings during this shared patient visit. Please see the separate note by Dr.  Lisbeth Renshaw on this date for the remainder of the patient's plan of care.    Carola Rhine, PAC

## 2017-12-31 NOTE — Progress Notes (Signed)
[facility] OFFICE PROGRESS NOTE  [name], MD [place] [address]  DIAGNOSIS: Stage IV non-small cell lung cancer, squamous cell carcinoma presented with large right hilar mass with encasement of the right pulmonary artery as well as superior vena cava and right upper lobe collapse in addition to precarinal lymphadenopathy diagnosed in February 2019.  PRIOR THERAPY: None  CURRENT THERAPY: Pending consultation with radiation oncology for consideration of SRS to the brain lesion and palliative radiation to the right upper lobe of the lung lesion.  INTERVAL HISTORY: Beth Alexander 67 y.o. female returns for a routine follow-up visit accompanied by her sister.  The patient is feeling fine today with no specific complaints except for occasional right sided chest pain.  She uses Tylenol intermittently which is effective.  The patient denies fevers and chills.  Denies shortness of breath, cough, hemoptysis.  Denies nausea, vomiting, constipation, diarrhea.  Denies recent weight loss or night sweats.  Denies headaches, visual changes, and dizziness.  The patient had a recent MRI of the brain and PET scan is here to discuss the results.  MEDICAL HISTORY: Past Medical History:  Diagnosis Date  . Hypertension   . Tobacco use     ALLERGIES:  has No Known Allergies.  MEDICATIONS:  Current Outpatient Medications  Medication Sig Dispense Refill  . acetaminophen (TYLENOL) 500 MG tablet Take 500 mg by mouth every 6 (six) hours as needed for mild pain or headache.    Marland Kitchen amLODipine (NORVASC) 5 MG tablet Take 1 tablet (5 mg total) by mouth daily. 30 tablet 5  . aspirin EC 81 MG tablet Take 81 mg by mouth daily.    Marland Kitchen dextromethorphan-guaiFENesin (MUCINEX DM) 30-600 MG 12hr tablet Take 1 tablet by mouth 2 (two) times daily as needed for cough.    . Menthol-Methyl Salicylate (MUSCLE RUB) 10-15 % CREA Apply 1 application topically daily as needed for muscle pain.     .  nicotine (NICODERM CQ - DOSED IN MG/24 HOURS) 14 mg/24hr patch Place 1 patch (14 mg total) onto the skin daily. 28 patch 0  . albuterol (PROVENTIL HFA;VENTOLIN HFA) 108 (90 Base) MCG/ACT inhaler     . dexamethasone (DECADRON) 4 MG tablet Take 1 tablet (4 mg total) by mouth 2 (two) times daily with a meal. 28 tablet 0  . HYDROcodone-acetaminophen (NORCO/VICODIN) 5-325 MG tablet Take 2 tablets by mouth every 4 (four) hours as needed. (Patient not taking: Reported on 12/31/2017) 10 tablet 0  . ibuprofen (ADVIL,MOTRIN) 200 MG tablet Take 200 mg by mouth every 6 (six) hours as needed for fever or moderate pain.    Marland Kitchen LORazepam (ATIVAN) 0.5 MG tablet 1 tablet po 30 minutes prior to radiation or MRI, or every 4-6 hours prn anxiety 60 tablet 0  . tiZANidine (ZANAFLEX) 4 MG tablet      No current facility-administered medications for this visit.     SURGICAL HISTORY:  Past Surgical History:  Procedure Laterality Date  . BREAST LUMPECTOMY WITH RADIOACTIVE SEED LOCALIZATION Left 11/08/2016   Procedure: LEFT BREAST LUMPECTOMY WITH RADIOACTIVE SEED LOCALIZATION;  Surgeon: Donnie Mesa, MD;  Location: [facility];  Service: General;  Laterality: Left;  Marland Kitchen VIDEO BRONCHOSCOPY WITH ENDOBRONCHIAL ULTRASOUND N/A 12/11/2017   Procedure: VIDEO BRONCHOSCOPY WITH ENDOBRONCHIAL ULTRASOUND;  Surgeon: Marshell Garfinkel, MD;  Location: [facility];  Service: Pulmonary;  Laterality: N/A;    REVIEW OF SYSTEMS:   Review of Systems  Constitutional: Negative for appetite change, chills, fatigue, fever  and unexpected weight change.  HENT:   Negative for mouth sores, nosebleeds, sore throat and trouble swallowing.   Eyes: Negative for eye problems and icterus.  Respiratory: Negative for cough, hemoptysis, shortness of breath and wheezing.   Cardiovascular: Negative for and leg swelling. Positive for intermittent right-sided chest pain. Gastrointestinal: Negative for abdominal pain, constipation, diarrhea, nausea and  vomiting.  Genitourinary: Negative for bladder incontinence, difficulty urinating, dysuria, frequency and hematuria.   Musculoskeletal: Negative for back pain, gait problem, neck pain and neck stiffness.  Skin: Negative for itching and rash.  Neurological: Negative for dizziness, extremity weakness, gait problem, headaches, light-headedness and seizures.  Hematological: Negative for adenopathy. Does not bruise/bleed easily.  Psychiatric/Behavioral: Negative for confusion, depression and sleep disturbance. The patient is not nervous/anxious.     PHYSICAL EXAMINATION:  Blood pressure 123/72, pulse 99, temperature 98.6 F (37 C), temperature source Oral, resp. rate 17, height 5\' 3"  (1.6 m), weight 133 lb 11.2 oz (60.6 kg), SpO2 97 %.  ECOG PERFORMANCE STATUS: 1 - Symptomatic but completely ambulatory  Physical Exam  Constitutional: Oriented to person, place, and time and well-developed, well-nourished, and in no distress. No distress.  HENT:  Head: Normocephalic and atraumatic.  Mouth/Throat: Oropharynx is clear and moist. No oropharyngeal exudate.  Eyes: Conjunctivae are normal. Right eye exhibits no discharge. Left eye exhibits no discharge. No scleral icterus.  Neck: Normal range of motion. Neck supple.  Cardiovascular: Normal rate, regular rhythm, normal heart sounds and intact distal pulses.   Pulmonary/Chest: Effort normal and breath sounds normal. No respiratory distress. No wheezes. No rales.  Abdominal: Soft. Bowel sounds are normal. Exhibits no distension and no mass. There is no tenderness.  Musculoskeletal: Normal range of motion. Exhibits no edema.  Lymphadenopathy:    No cervical adenopathy.  Neurological: Alert and oriented to person, place, and time. Exhibits normal muscle tone. Gait normal. Coordination normal.  Skin: Skin is warm and dry. No rash noted. Not diaphoretic. No erythema. No pallor.  Psychiatric: Mood, memory and judgment normal.  Vitals  reviewed.  LABORATORY DATA: Lab Results  Component Value Date   WBC 6.4 12/31/2017   HGB 11.3 (L) 12/10/2017   HCT 35.1 12/31/2017   MCV 95.4 12/31/2017   PLT 410 (H) 12/31/2017      Chemistry      Component Value Date/Time   NA 139 12/31/2017 0851   K 3.7 12/31/2017 0851   CL 102 12/31/2017 0851   CO2 27 12/31/2017 0851   BUN 8 12/31/2017 0851   CREATININE 0.76 12/31/2017 0851      Component Value Date/Time   CALCIUM 10.0 12/31/2017 0851   ALKPHOS 74 12/31/2017 0851   AST 11 12/31/2017 0851   ALT 11 12/31/2017 0851   BILITOT 0.2 12/31/2017 0851       RADIOGRAPHIC STUDIES:  Dg Chest 2 View  Result Date: 12/09/2017 CLINICAL DATA:  Cough and right-sided chest pain for 1 week. EXAM: CHEST  2 VIEW COMPARISON:  None. FINDINGS: The heart size is normal. There is mild perihilar peribronchial thickening. There is right-sided volume loss secondary to right upper lobe collapse. The findings are suspicious for right upper lobe/hilar lesion. Small right pleural effusion. Left lung is essentially clear. No pulmonary edema. IMPRESSION: Right upper lobe collapse. Recommend further evaluation CT of the chest. Intravenous contrast is recommended unless contraindicated. Electronically Signed   By: Nolon Nations M.D.   On: 12/09/2017 20:42   Ct Chest W Contrast  Result Date: 12/10/2017 CLINICAL DATA:  Chest pain or shortness of breath. Right upper lobe collapse. EXAM: CT CHEST WITH CONTRAST TECHNIQUE: Multidetector CT imaging of the chest was performed during intravenous contrast administration. 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 Coronary artery calcification. Small pericardial effusion. Normal heart size. Calcific atherosclerosis of the aorta. Mediastinum/Nodes: Massive pretracheal lymphadenopathy with associated narrowing of the superior vena  cava. Right hilar mass encases the right pulmonary artery, with occlusion of the right upper lobe artery. The mass measures approximately 5.5 x 5.1 cm. Lungs/Pleura: There is complete right upper lobe collapse due to occlusion of the right upper lobe bronchus by a right hilar mass. Right middle lobe is clear. There is a lobulated right basilar pleural effusion. The left lung is clear suspected 2.5 cm mass of the right posterior pleura. Upper Abdomen: Hypoattenuation of the anterior left kidney upper pole, possibly artifactual. Otherwise normal visualized upper abdominal organs. Musculoskeletal: No acute osseous abnormality. No lytic or blastic lesions. IMPRESSION: 1. Large right hilar mass encasing the right pulmonary artery and occluding the right upper lobe bronchus with associated complete right upper lobe collapse. The right upper lobe pulmonary artery also likely occluded. 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 2. General lack of enhancement of the right upper lobe parenchyma may be due to necrosis or vascular compromise. Necrotic tumor within atelectatic lung is also a possibility. 3. Right pleural effusion with area of rounded hyperattenuation along the posterior pleura concerning for a pleural based mass. 4. Narrowing of the superior vena cava secondary to mass effect from right hilar mass and confluent mediastinal adenopathy. 5. Small pericardial effusion. 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 Electronically Signed   By: Ulyses Jarred M.D.   On: 12/10/2017 02:17   Mr Jeri Cos LK Contrast  Result Date: 12/27/2017 CLINICAL DATA:  New diagnosis lung cancer.  Staging. EXAM: MRI HEAD WITHOUT AND WITH CONTRAST TECHNIQUE: Multiplanar, multiecho pulse sequences of the brain and surrounding structures were obtained without and with intravenous contrast. CONTRAST:  71mL MULTIHANCE GADOBENATE DIMEGLUMINE 529 MG/ML IV SOLN COMPARISON:   None. FINDINGS: Brain: The brainstem and cerebellum are normal. Within the right thalamus, there is a necrotic metastasis measuring 2.3 cm in diameter. Very minimal surrounding edema. No second lesion is identified. There are ordinary mild chronic small-vessel ischemic changes of the hemispheric white matter. No cortical abnormality. No hydrocephalus or extra-axial collection. Vascular: Major vessels at the base of the brain show flow. Skull and upper cervical spine: Negative Sinuses/Orbits: Clear/normal. Old medial wall blowout fracture on the right. 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 No significant edema reaction. No second lesion. Electronically Signed   By: Nelson Chimes M.D.   On: 12/27/2017 13:05   Nm Pet Image Initial (pi) Skull Base To Thigh  Result Date: 12/27/2017 CLINICAL DATA:  Initial treatment strategy for lung cancer. EXAM: NUCLEAR MEDICINE PET SKULL BASE TO THIGH TECHNIQUE: His 6.87 mCi F-18 FDG was injected intravenously. Full-ring PET imaging was performed from the skull base to thigh after the radiotracer. CT data was obtained and used for attenuation correction and anatomic localization. Fasting blood glucose: 97 mg/dl Mediastinal blood pool activity: SUV max 2.23 COMPARISON:  None. FINDINGS: NECK: No hypermetabolic lymph nodes in the neck. Incidental CT findings: none CHEST: Large hypermetabolic mass within the right upper lobe invading the right hilum and right  paratracheal region measuring 6.5 cm with an SUV max equal to 20.64. The tumor has mass effect upon the right lateral and ventral wall of the trachea. There is encasement and complete occlusion of the right upper lobe bronchus with postobstructive consolidation of the right upper lobe. Two separate hypermetabolic lesions are identified within the right upper lobe. Central right upper lobe lesion measures 2.5 cm and has an SUV max equal to 17.36. Within the posterior right  upper lobe there is a 2.1 cm lesion within SUV max of 26. Associated posterior chest wall invasion is suspected, image 49/4. There is a small right pleural effusion. A hypermetabolic pleural base nodule which extends into the posterior chest wall is noted. This measures approximately 3.3 cm and has an SUV max equal to 24. Associated erosive changes along the right ninth rib is identified, image 82/4. Incidental CT findings: There is moderate cardiac enlargement and aortic atherosclerosis. Calcification in the LAD, left circumflex and RCA coronary artery noted. ABDOMEN/PELVIS: No abnormal hypermetabolic activity within the liver, pancreas, adrenal glands, or spleen. There is a hypermetabolic mass arising from the upper pole of right kidney corresponding to an area of relative hypoattenuation on CT from 12/10/2017. This mass measures approximately 3.1 cm and has an SUV max of 16.17. Favor metastatic disease. Similarly there is a small cortical based hypermetabolic lesion arising from the inferior pole of the left kidney with anSUV max of 9.6. Small retrocaval lymph node is hypermetabolic with an SUV max of 6.5. Incidental CT findings: none SKELETON: No focal hypermetabolic activity to suggest skeletal metastasis. Incidental CT findings: none IMPRESSION: 1. Large mass involving the right paratracheal region, right hilar and medial right upper lobe identified compatible with primary bronchogenic carcinoma. Complete occlusion of the right upper lobe bronchus with postobstructive pneumonitis is identified. There are 2 additional separate lesions within the right upper lobe. One of these appears to invade the posterior chest wall. Additionally, there is a right pleural effusion and a pleural base mass at the posterior right lung base which also invades the posterior chest wall. Bilateral renal metastasis are suspected as well as left retroperitoneal nodal metastasis. Assuming a non-small cell histology imaging findings are  compatible with a [id number] lesion or stage IV disease. Electronically Signed   By: Kerby Moors M.D.   On: 12/27/2017 13:30   Dg Chest Port 1 View  Result Date: 12/11/2017 CLINICAL DATA:  66 year old female status post bronchoscopic biopsy. EXAM: PORTABLE CHEST 1 VIEW COMPARISON:  Chest CT 12/10/2017 and earlier. FINDINGS: Portable AP semi upright view at 1625 hrs. Stable lung volumes. Unchanged masslike opacity throughout the right upper lung, inseparable from the right hilum. No pneumothorax. Trace right pleural effusion appears stable. Stable cardiac size and mediastinal contours. No new pulmonary opacity. Mildly to moderately increased gaseous distension of the stomach. IMPRESSION: 1. No adverse features status post bronchoscopic biopsy. 2. No new cardiopulmonary abnormality. 3. Mild to moderate new gaseous distension of the stomach. Electronically Signed   By: Genevie Ann M.D.   On: 12/11/2017 16:50     ASSESSMENT/PLAN:  Squamous cell carcinoma of right lung Beth Alexander) This is a very pleasant 67 year old African-American female recently diagnosed with stage IV non-small cell lung cancer, squamous cell carcinoma presented with large right hilar mass with encasement of the right pulmonary artery as well as superior vena cava and right upper lobe collapse in addition to precarinal lymphadenopathy diagnosed in February 2019. She had a recent MRI of the brain and PET scan  to complete her staging workup and is here to discuss the results.  The patient was seen with Dr. Julien Nordmann.  We reviewed the MRI of the brain results and images with the patient and her sister.  Unfortunately, there is a 2.3 cm lesion in the right thalamus.  Recommend that she be evaluated by radiation oncology for consideration of SRS to the brain.  We will start her on dexamethasone 4 mg twice a day for 2 weeks due to the possibility of vasogenic edema.  PET scan results and images were discussed with the patient and her sister which showed a  large mass in her right paratracheal region, right hilar and medial right upper lobe which is compatible with her primary lung cancer.  There is complete occlusion of the right upper lobe bronchus.  There is a right pleural effusion and pleural-based mass at the posterior right lung base which does invade the posterior chest wall.  There is also bilateral renal metastasis suspected as well as left retroperitoneal and nodal metastasis. We discussed that this represents stage IV disease.  Recommend a course of palliative radiation to the right lung mass.  Following her radiation, we will get her set up for systemic chemotherapy with carboplatin, Taxol, and Keytruda.  We will see her back in about 2 weeks to have further discussions about her treatment options.  She was advised to call immediately if she has any concerning symptoms in the interval. The patient voices understanding of current disease status and treatment options and is in agreement with the current care plan.  All questions were answered. The patient knows to call the clinic with any problems, questions or concerns. We can certainly see the patient much sooner if necessary.  No orders of the defined types were placed in this encounter.  Mikey Bussing, DNP, AGPCNP-BC, AOCNP 12/31/17  ADDENDUM: Hematology/Oncology Attending: I had a face-to-face encounter with the patient today.  I recommended her care plan.  This is a very pleasant 67 years old African-American female who was recently diagnosed with a stage IV non-small cell lung cancer, squamous cell carcinoma presented with large right hilar mass with encasement of the right pulmonary artery and superior vena cava as well as collapse of the right upper lobe.  The patient had imaging studies to complete the staging work-up of her disease and the PET scan showed the previous lesions in addition to right pleural effusion and pleural-based mass as well as bilateral renal metastases and  retroperitoneal nodal metastasis.  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 I recommended for the patient to see Dr. Genia Harold for consideration of palliative radiotherapy to the solitary brain metastasis as well as short course of palliative radiotherapy to the right obstructive lung mass.  We will also start the patient on Decadron 4 mg p.o. twice daily for the recently diagnosed brain metastasis. We will arrange for the patient to come back for follow-up visit in around 2 weeks for reevaluation and discussion of systemic treatment options probably with carboplatin, paclitaxel and Keytruda. The patient and her sister agreed to the current plan. She was advised to call immediately if she has any concerning symptoms in the interval.  Disclaimer: This note was dictated with voice recognition software. Similar sounding words can inadvertently be transcribed and may be missed upon review. Eilleen Kempf, MD 12/31/17

## 2017-12-31 NOTE — Assessment & Plan Note (Signed)
This is a very pleasant 67 year old African-American female recently diagnosed with stage IV non-small cell lung cancer, squamous cell carcinoma presented with large right hilar mass with encasement of the right pulmonary artery as well as superior vena cava and right upper lobe collapse in addition to precarinal lymphadenopathy diagnosed in February 2019. She had a recent MRI of the brain and PET scan to complete her staging workup and is here to discuss the results.  The patient was seen with Dr. Julien Nordmann.  We reviewed the MRI of the brain results and images with the patient and her sister.  Unfortunately, there is a 2.3 cm lesion in the right thalamus.  Recommend that she be evaluated by radiation oncology for consideration of SRS to the brain.  We will start her on dexamethasone 4 mg twice a day for 2 weeks due to the possibility of vasogenic edema.  PET scan results and images were discussed with the patient and her sister which showed a large mass in her right paratracheal region, right hilar and medial right upper lobe which is compatible with her primary lung cancer.  There is complete occlusion of the right upper lobe bronchus.  There is a right pleural effusion and pleural-based mass at the posterior right lung base which does invade the posterior chest wall.  There is also bilateral renal metastasis suspected as well as left retroperitoneal and nodal metastasis. We discussed that this represents stage IV disease.  Recommend a course of palliative radiation to the right lung mass.  Following her radiation, we will get her set up for systemic chemotherapy with carboplatin, Taxol, and Keytruda.  We will see her back in about 2 weeks to have further discussions about her treatment options.  She was advised to call immediately if she has any concerning symptoms in the interval. The patient voices understanding of current disease status and treatment options and is in agreement with the current care  plan.  All questions were answered. The patient knows to call the clinic with any problems, questions or concerns. We can certainly see the patient much sooner if necessary.

## 2018-01-01 ENCOUNTER — Other Ambulatory Visit: Payer: Self-pay | Admitting: Radiation Therapy

## 2018-01-01 DIAGNOSIS — C7949 Secondary malignant neoplasm of other parts of nervous system: Principal | ICD-10-CM

## 2018-01-01 DIAGNOSIS — C7931 Secondary malignant neoplasm of brain: Secondary | ICD-10-CM

## 2018-01-02 ENCOUNTER — Ambulatory Visit
Admission: RE | Admit: 2018-01-02 | Discharge: 2018-01-02 | Disposition: A | Discharge status: Home / Self Care | Payer: Medicare HMO | Source: Ambulatory Visit | Attending: Radiation Oncology | Admitting: Radiation Oncology

## 2018-01-02 ENCOUNTER — Encounter: Payer: Self-pay | Admitting: General Practice

## 2018-01-02 DIAGNOSIS — Z510 Encounter for antineoplastic radiation therapy: Secondary | ICD-10-CM | POA: Insufficient documentation

## 2018-01-02 DIAGNOSIS — C3411 Malignant neoplasm of upper lobe, right bronchus or lung: Secondary | ICD-10-CM | POA: Diagnosis not present

## 2018-01-02 DIAGNOSIS — C7931 Secondary malignant neoplasm of brain: Secondary | ICD-10-CM | POA: Diagnosis not present

## 2018-01-02 NOTE — Progress Notes (Signed)
[facility] Psychosocial Distress Screening Clinical Social Work  Clinical Social Work was referred by distress screening protocol.  The patient scored a 9 on the Psychosocial Distress Thermometer which indicates severe distress. Clinical Social Worker Edwyna Shell to assess for distress and other psychosocial needs. CSW and patient discussed common feeling and emotions when being diagnosed with cancer, and the importance of support during treatment. CSW informed patient of the support team and support services at [facility]. CSW provided contact information and encouraged patient to call with any questions or concerns. Patient reports she is very depressed, stressor is cancer and its treatment.  Uses doing puzzles and reading as coping strategies.  Has support from family - sister transports to appointments. Attends church.  Does not want referral for mental health support at this time.  Agreeable to meeting CSW briefly in waiting room prior to [facility] appointment today.  ONCBCN DISTRESS SCREENING 12/31/2017  Screening Type Initial Screening  Distress experienced in past week (1-10) 9  Emotional problem type Depression;Feeling hopeless  Physical Problem type Pain    Clinical Social Worker follow up needed: Yes.    If yes, follow up plan:  Give information packet to patient in waiting room today, encourage contact w [facility] as needed.  Provide information about [facility].    Edwyna Shell, LCSW Clinical Social Worker Phone:  [phone]

## 2018-01-03 DIAGNOSIS — Z510 Encounter for antineoplastic radiation therapy: Secondary | ICD-10-CM | POA: Diagnosis not present

## 2018-01-06 ENCOUNTER — Other Ambulatory Visit: Payer: Self-pay | Admitting: Radiation Oncology

## 2018-01-06 ENCOUNTER — Ambulatory Visit
Admission: RE | Admit: 2018-01-06 | Discharge: 2018-01-06 | Disposition: A | Discharge status: Home / Self Care | Payer: Medicare HMO | Source: Ambulatory Visit | Attending: Radiation Oncology | Admitting: Radiation Oncology

## 2018-01-06 DIAGNOSIS — Z510 Encounter for antineoplastic radiation therapy: Secondary | ICD-10-CM | POA: Diagnosis not present

## 2018-01-06 MED ORDER — OXYCODONE HCL 5 MG PO TABS: 5.0000 mg | ORAL_TABLET | ORAL | 0 refills | Status: DC | PRN

## 2018-01-07 ENCOUNTER — Telehealth: Payer: Self-pay | Admitting: Medical Oncology

## 2018-01-07 ENCOUNTER — Telehealth: Payer: Self-pay | Admitting: Radiation Oncology

## 2018-01-07 ENCOUNTER — Ambulatory Visit
Admission: RE | Admit: 2018-01-07 | Discharge: 2018-01-07 | Disposition: A | Discharge status: Home / Self Care | Payer: Medicare HMO | Source: Ambulatory Visit | Attending: Radiation Oncology | Admitting: Radiation Oncology

## 2018-01-07 DIAGNOSIS — Z510 Encounter for antineoplastic radiation therapy: Secondary | ICD-10-CM | POA: Diagnosis not present

## 2018-01-07 MED ORDER — OXYCODONE HCL 5 MG PO TABS: 5.0000 mg | ORAL_TABLET | ORAL | 0 refills | Status: DC | PRN

## 2018-01-07 NOTE — Telephone Encounter (Signed)
Pt >[facility] cannot fill ox[facility]codone-not available . [facility]. I  told pt to contact [facility] for rx and to call back to radiation if [facility] does not have it.

## 2018-01-07 NOTE — Telephone Encounter (Signed)
Pt called back and requested change in pharmacy for pain medication. New rx sent to [facility] on [address].

## 2018-01-08 ENCOUNTER — Ambulatory Visit
Admission: RE | Admit: 2018-01-08 | Discharge: 2018-01-08 | Disposition: A | Discharge status: Home / Self Care | Payer: Medicare HMO | Source: Ambulatory Visit | Attending: Radiation Oncology | Admitting: Radiation Oncology

## 2018-01-08 DIAGNOSIS — Z510 Encounter for antineoplastic radiation therapy: Secondary | ICD-10-CM | POA: Diagnosis not present

## 2018-01-09 ENCOUNTER — Ambulatory Visit
Admission: RE | Admit: 2018-01-09 | Discharge: 2018-01-09 | Disposition: A | Discharge status: Home / Self Care | Payer: Medicare HMO | Source: Ambulatory Visit | Attending: Radiation Oncology | Admitting: Radiation Oncology

## 2018-01-09 DIAGNOSIS — Z510 Encounter for antineoplastic radiation therapy: Secondary | ICD-10-CM | POA: Diagnosis not present

## 2018-01-10 ENCOUNTER — Ambulatory Visit
Admission: RE | Admit: 2018-01-10 | Discharge: 2018-01-10 | Disposition: A | Discharge status: Home / Self Care | Payer: Medicare HMO | Source: Ambulatory Visit | Attending: Radiation Oncology | Admitting: Radiation Oncology

## 2018-01-10 DIAGNOSIS — C7931 Secondary malignant neoplasm of brain: Secondary | ICD-10-CM

## 2018-01-10 DIAGNOSIS — Z510 Encounter for antineoplastic radiation therapy: Secondary | ICD-10-CM | POA: Diagnosis not present

## 2018-01-10 DIAGNOSIS — C7949 Secondary malignant neoplasm of other parts of nervous system: Principal | ICD-10-CM

## 2018-01-10 MED ORDER — GADOBENATE DIMEGLUMINE 529 MG/ML IV SOLN
12.0000 mL | Freq: Once | INTRAVENOUS | Status: AC | PRN
  Administered 2018-01-10: 12 mL via INTRAVENOUS

## 2018-01-13 ENCOUNTER — Ambulatory Visit
Admission: RE | Admit: 2018-01-13 | Discharge: 2018-01-13 | Disposition: A | Discharge status: Home / Self Care | Payer: Medicare HMO | Source: Ambulatory Visit | Attending: Radiation Oncology | Admitting: Radiation Oncology

## 2018-01-13 DIAGNOSIS — Z510 Encounter for antineoplastic radiation therapy: Secondary | ICD-10-CM | POA: Diagnosis not present

## 2018-01-14 ENCOUNTER — Ambulatory Visit
Admission: RE | Admit: 2018-01-14 | Discharge: 2018-01-14 | Disposition: A | Discharge status: Home / Self Care | Payer: Medicare HMO | Source: Ambulatory Visit | Attending: Radiation Oncology | Admitting: Radiation Oncology

## 2018-01-14 ENCOUNTER — Encounter: Payer: Self-pay | Admitting: Radiation Therapy

## 2018-01-14 ENCOUNTER — Other Ambulatory Visit: Payer: Self-pay | Admitting: Oncology

## 2018-01-14 VITALS — BP 135/78 | HR 87 | Temp 98.1°F | Resp 16 | Wt 130.4 lb

## 2018-01-14 DIAGNOSIS — C7931 Secondary malignant neoplasm of brain: Secondary | ICD-10-CM

## 2018-01-14 DIAGNOSIS — C3491 Malignant neoplasm of unspecified part of right bronchus or lung: Secondary | ICD-10-CM

## 2018-01-14 DIAGNOSIS — Z510 Encounter for antineoplastic radiation therapy: Secondary | ICD-10-CM | POA: Diagnosis not present

## 2018-01-14 MED ORDER — SODIUM CHLORIDE 0.9% FLUSH
10.0000 mL | Freq: Once | INTRAVENOUS | Status: AC
  Administered 2018-01-14: 10 mL via INTRAVENOUS

## 2018-01-14 NOTE — Progress Notes (Signed)
Has armband been applied? Yes  Does patient have an allergy to IV contrast dye?: No   Has patient ever received premedication for IV contrast dye?: No  Does patient take metformin?: No  If patient does take metformin when was the last dose: N/A  Date of lab work: 12/31/2017 BUN: 8 CR: 0.76 eGFR: >60  IV site: Left Forearm  Has IV site been added to flowsheet?  Yes  There were no vitals taken for this visit.

## 2018-01-15 ENCOUNTER — Ambulatory Visit
Admission: RE | Admit: 2018-01-15 | Discharge: 2018-01-15 | Disposition: A | Discharge status: Home / Self Care | Payer: Medicare HMO | Source: Ambulatory Visit | Attending: Radiation Oncology | Admitting: Radiation Oncology

## 2018-01-15 ENCOUNTER — Telehealth: Payer: Self-pay | Admitting: Oncology

## 2018-01-15 ENCOUNTER — Other Ambulatory Visit: Payer: Self-pay | Admitting: Internal Medicine

## 2018-01-15 ENCOUNTER — Telehealth: Payer: Self-pay | Admitting: General Practice

## 2018-01-15 ENCOUNTER — Encounter: Payer: Self-pay | Admitting: Oncology

## 2018-01-15 ENCOUNTER — Inpatient Hospital Stay: Payer: Medicare HMO

## 2018-01-15 ENCOUNTER — Inpatient Hospital Stay (HOSPITAL_BASED_OUTPATIENT_CLINIC_OR_DEPARTMENT_OTHER): Payer: Medicare HMO | Admitting: Oncology

## 2018-01-15 VITALS — BP 120/55 | HR 80 | Temp 98.0°F | Resp 18 | Ht 63.0 in | Wt 127.8 lb

## 2018-01-15 DIAGNOSIS — Z7189 Other specified counseling: Secondary | ICD-10-CM

## 2018-01-15 DIAGNOSIS — Z803 Family history of malignant neoplasm of breast: Secondary | ICD-10-CM

## 2018-01-15 DIAGNOSIS — Z7982 Long term (current) use of aspirin: Secondary | ICD-10-CM

## 2018-01-15 DIAGNOSIS — C3491 Malignant neoplasm of unspecified part of right bronchus or lung: Secondary | ICD-10-CM

## 2018-01-15 DIAGNOSIS — R634 Abnormal weight loss: Secondary | ICD-10-CM

## 2018-01-15 DIAGNOSIS — Z923 Personal history of irradiation: Secondary | ICD-10-CM | POA: Diagnosis not present

## 2018-01-15 DIAGNOSIS — F17210 Nicotine dependence, cigarettes, uncomplicated: Secondary | ICD-10-CM

## 2018-01-15 DIAGNOSIS — C3401 Malignant neoplasm of right main bronchus: Secondary | ICD-10-CM | POA: Diagnosis not present

## 2018-01-15 DIAGNOSIS — Z9221 Personal history of antineoplastic chemotherapy: Secondary | ICD-10-CM | POA: Diagnosis not present

## 2018-01-15 DIAGNOSIS — Z79899 Other long term (current) drug therapy: Secondary | ICD-10-CM

## 2018-01-15 DIAGNOSIS — Z800 Family history of malignant neoplasm of digestive organs: Secondary | ICD-10-CM | POA: Diagnosis not present

## 2018-01-15 DIAGNOSIS — Z510 Encounter for antineoplastic radiation therapy: Secondary | ICD-10-CM | POA: Diagnosis not present

## 2018-01-15 DIAGNOSIS — C7931 Secondary malignant neoplasm of brain: Secondary | ICD-10-CM

## 2018-01-15 DIAGNOSIS — I10 Essential (primary) hypertension: Secondary | ICD-10-CM

## 2018-01-15 DIAGNOSIS — Z5111 Encounter for antineoplastic chemotherapy: Secondary | ICD-10-CM | POA: Insufficient documentation

## 2018-01-15 DIAGNOSIS — Z801 Family history of malignant neoplasm of trachea, bronchus and lung: Secondary | ICD-10-CM

## 2018-01-15 DIAGNOSIS — Z5112 Encounter for antineoplastic immunotherapy: Secondary | ICD-10-CM | POA: Insufficient documentation

## 2018-01-15 LAB — CBC WITH DIFFERENTIAL (CANCER CENTER ONLY)
Basophils Absolute: 0 10*3/uL (ref 0.0–0.1)
Basophils Relative: 0 %
EOS ABS: 0 10*3/uL (ref 0.0–0.5)
EOS PCT: 0 %
HCT: 37.6 % (ref 34.8–46.6)
Hemoglobin: 12.9 g/dL (ref 11.6–15.9)
Lymphocytes Relative: 8 %
Lymphs Abs: 0.9 10*3/uL (ref 0.9–3.3)
MCH: 31.6 pg (ref 25.1–34.0)
MCHC: 34.3 g/dL (ref 31.5–36.0)
MCV: 92.2 fL (ref 79.5–101.0)
Monocytes Absolute: 0.9 10*3/uL (ref 0.1–0.9)
Monocytes Relative: 8 %
Neutro Abs: 9.5 10*3/uL — ABNORMAL HIGH (ref 1.5–6.5)
Neutrophils Relative %: 84 %
PLATELETS: 426 10*3/uL — AB (ref 145–400)
RBC: 4.08 MIL/uL (ref 3.70–5.45)
RDW: 13.5 % (ref 11.2–14.5)
WBC: 11.3 10*3/uL — AB (ref 3.9–10.3)

## 2018-01-15 LAB — CMP (CANCER CENTER ONLY)
ALK PHOS: 56 U/L (ref 40–150)
ALT: 14 U/L (ref 0–55)
AST: 9 U/L (ref 5–34)
Albumin: 2.8 g/dL — ABNORMAL LOW (ref 3.5–5.0)
Anion gap: 7 (ref 3–11)
BILIRUBIN TOTAL: 0.4 mg/dL (ref 0.2–1.2)
BUN: 16 mg/dL (ref 7–26)
CO2: 29 mmol/L (ref 22–29)
CREATININE: 0.66 mg/dL (ref 0.60–1.10)
Calcium: 9.8 mg/dL (ref 8.4–10.4)
Chloride: 100 mmol/L (ref 98–109)
GFR, Est AFR Am: >60 mL/min (ref >60)
Glucose, Bld: 78 mg/dL (ref 70–140)
Potassium: 3.8 mmol/L (ref 3.5–5.1)
Sodium: 136 mmol/L (ref 136–145)
TOTAL PROTEIN: 6.8 g/dL (ref 6.4–8.3)

## 2018-01-15 MED ORDER — PROCHLORPERAZINE MALEATE 10 MG PO TABS: 10.0000 mg | ORAL_TABLET | Freq: Four times a day (QID) | ORAL | 1 refills | Status: DC | PRN

## 2018-01-15 NOTE — Patient Instructions (Signed)
Carboplatin injection What is this medicine? CARBOPLATIN (KAR boe pla tin) is a chemotherapy drug. It targets fast dividing cells, like cancer cells, and causes these cells to die. This medicine is used to treat ovarian cancer and many other cancers. This medicine may be used for other purposes; ask your health care provider or pharmacist if you have questions. COMMON BRAND NAME(S): Paraplatin What should I tell my health care provider before I take this medicine? They need to know if you have any of these conditions: -blood disorders -hearing problems -kidney disease -recent or ongoing radiation therapy -an unusual or allergic reaction to carboplatin, cisplatin, other chemotherapy, other medicines, foods, dyes, or preservatives -pregnant or trying to get pregnant -breast-feeding How should I use this medicine? This drug is usually given as an infusion into a vein. It is administered in a hospital or clinic by a specially trained health care professional. Talk to your pediatrician regarding the use of this medicine in children. Special care may be needed. Overdosage: If you think you have taken too much of this medicine contact a poison control center or emergency room at once. NOTE: This medicine is only for you. Do not share this medicine with others. What if I miss a dose? It is important not to miss a dose. Call your doctor or health care professional if you are unable to keep an appointment. What may interact with this medicine? -medicines for seizures -medicines to increase blood counts like filgrastim, pegfilgrastim, sargramostim -some antibiotics like amikacin, gentamicin, neomycin, streptomycin, tobramycin -vaccines Talk to your doctor or health care professional before taking any of these medicines: -acetaminophen -aspirin -ibuprofen -ketoprofen -naproxen This list may not describe all possible interactions. Give your health care provider a list of all the medicines, herbs,  non-prescription drugs, or dietary supplements you use. Also tell them if you smoke, drink alcohol, or use illegal drugs. Some items may interact with your medicine. What should I watch for while using this medicine? Your condition will be monitored carefully while you are receiving this medicine. You will need important blood work done while you are taking this medicine. This drug may make you feel generally unwell. This is not uncommon, as chemotherapy can affect healthy cells as well as cancer cells. Report any side effects. Continue your course of treatment even though you feel ill unless your doctor tells you to stop. In some cases, you may be given additional medicines to help with side effects. Follow all directions for their use. Call your doctor or health care professional for advice if you get a fever, chills or sore throat, or other symptoms of a cold or flu. Do not treat yourself. This drug decreases your body's ability to fight infections. Try to avoid being around people who are sick. This medicine may increase your risk to bruise or bleed. Call your doctor or health care professional if you notice any unusual bleeding. Be careful brushing and flossing your teeth or using a toothpick because you may get an infection or bleed more easily. If you have any dental work done, tell your dentist you are receiving this medicine. Avoid taking products that contain aspirin, acetaminophen, ibuprofen, naproxen, or ketoprofen unless instructed by your doctor. These medicines may hide a fever. Do not become pregnant while taking this medicine. Women should inform their doctor if they wish to become pregnant or think they might be pregnant. There is a potential for serious side effects to an unborn child. Talk to your health care professional or  pharmacist for more information. Do not breast-feed an infant while taking this medicine. What side effects may I notice from receiving this medicine? Side effects  that you should report to your doctor or health care professional as soon as possible: -allergic reactions like skin rash, itching or hives, swelling of the face, lips, or tongue -signs of infection - fever or chills, cough, sore throat, pain or difficulty passing urine -signs of decreased platelets or bleeding - bruising, pinpoint red spots on the skin, black, tarry stools, nosebleeds -signs of decreased red blood cells - unusually weak or tired, fainting spells, lightheadedness -breathing problems -changes in hearing -changes in vision -chest pain -high blood pressure -low blood counts - This drug may decrease the number of white blood cells, red blood cells and platelets. You may be at increased risk for infections and bleeding. -nausea and vomiting -pain, swelling, redness or irritation at the injection site -pain, tingling, numbness in the hands or feet -problems with balance, talking, walking -trouble passing urine or change in the amount of urine Side effects that usually do not require medical attention (report to your doctor or health care professional if they continue or are bothersome): -hair loss -loss of appetite -metallic taste in the mouth or changes in taste This list may not describe all possible side effects. Call your doctor for medical advice about side effects. You may report side effects to FDA at 1-800-FDA-1088. Where should I keep my medicine? This drug is given in a hospital or clinic and will not be stored at home. NOTE: This sheet is a summary. It may not cover all possible information. If you have questions about this medicine, talk to your doctor, pharmacist, or health care provider.  2018 Elsevier/Gold Standard (2008-01-13 14:38:05)  Paclitaxel injection What is this medicine? PACLITAXEL (PAK li TAX el) is a chemotherapy drug. It targets fast dividing cells, like cancer cells, and causes these cells to die. This medicine is used to treat ovarian cancer,  breast cancer, and other cancers. This medicine may be used for other purposes; ask your health care provider or pharmacist if you have questions. COMMON BRAND NAME(S): Onxol, Taxol What should I tell my health care provider before I take this medicine? They need to know if you have any of these conditions: -blood disorders -irregular heartbeat -infection (especially a virus infection such as chickenpox, cold sores, or herpes) -liver disease -previous or ongoing radiation therapy -an unusual or allergic reaction to paclitaxel, alcohol, polyoxyethylated castor oil, other chemotherapy agents, other medicines, foods, dyes, or preservatives -pregnant or trying to get pregnant -breast-feeding How should I use this medicine? This drug is given as an infusion into a vein. It is administered in a hospital or clinic by a specially trained health care professional. Talk to your pediatrician regarding the use of this medicine in children. Special care may be needed. Overdosage: If you think you have taken too much of this medicine contact a poison control center or emergency room at once. NOTE: This medicine is only for you. Do not share this medicine with others. What if I miss a dose? It is important not to miss your dose. Call your doctor or health care professional if you are unable to keep an appointment. What may interact with this medicine? Do not take this medicine with any of the following medications: -disulfiram -metronidazole This medicine may also interact with the following medications: -cyclosporine -diazepam -ketoconazole -medicines to increase blood counts like filgrastim, pegfilgrastim, sargramostim -other chemotherapy drugs like  cisplatin, doxorubicin, epirubicin, etoposide, teniposide, vincristine -quinidine -testosterone -vaccines -verapamil Talk to your doctor or health care professional before taking any of these  medicines: -acetaminophen -aspirin -ibuprofen -ketoprofen -naproxen This list may not describe all possible interactions. Give your health care provider a list of all the medicines, herbs, non-prescription drugs, or dietary supplements you use. Also tell them if you smoke, drink alcohol, or use illegal drugs. Some items may interact with your medicine. What should I watch for while using this medicine? Your condition will be monitored carefully while you are receiving this medicine. You will need important blood work done while you are taking this medicine. This medicine can cause serious allergic reactions. To reduce your risk you will need to take other medicine(s) before treatment with this medicine. If you experience allergic reactions like skin rash, itching or hives, swelling of the face, lips, or tongue, tell your doctor or health care professional right away. In some cases, you may be given additional medicines to help with side effects. Follow all directions for their use. This drug may make you feel generally unwell. This is not uncommon, as chemotherapy can affect healthy cells as well as cancer cells. Report any side effects. Continue your course of treatment even though you feel ill unless your doctor tells you to stop. Call your doctor or health care professional for advice if you get a fever, chills or sore throat, or other symptoms of a cold or flu. Do not treat yourself. This drug decreases your body's ability to fight infections. Try to avoid being around people who are sick. This medicine may increase your risk to bruise or bleed. Call your doctor or health care professional if you notice any unusual bleeding. Be careful brushing and flossing your teeth or using a toothpick because you may get an infection or bleed more easily. If you have any dental work done, tell your dentist you are receiving this medicine. Avoid taking products that contain aspirin, acetaminophen, ibuprofen,  naproxen, or ketoprofen unless instructed by your doctor. These medicines may hide a fever. Do not become pregnant while taking this medicine. Women should inform their doctor if they wish to become pregnant or think they might be pregnant. There is a potential for serious side effects to an unborn child. Talk to your health care professional or pharmacist for more information. Do not breast-feed an infant while taking this medicine. Men are advised not to father a child while receiving this medicine. This product may contain alcohol. Ask your pharmacist or healthcare provider if this medicine contains alcohol. Be sure to tell all healthcare providers you are taking this medicine. Certain medicines, like metronidazole and disulfiram, can cause an unpleasant reaction when taken with alcohol. The reaction includes flushing, headache, nausea, vomiting, sweating, and increased thirst. The reaction can last from 30 minutes to several hours. What side effects may I notice from receiving this medicine? Side effects that you should report to your doctor or health care professional as soon as possible: -allergic reactions like skin rash, itching or hives, swelling of the face, lips, or tongue -low blood counts - This drug may decrease the number of white blood cells, red blood cells and platelets. You may be at increased risk for infections and bleeding. -signs of infection - fever or chills, cough, sore throat, pain or difficulty passing urine -signs of decreased platelets or bleeding - bruising, pinpoint red spots on the skin, black, tarry stools, nosebleeds -signs of decreased red blood cells - unusually weak  or tired, fainting spells, lightheadedness -breathing problems -chest pain -high or low blood pressure -mouth sores -nausea and vomiting -pain, swelling, redness or irritation at the injection site -pain, tingling, numbness in the hands or feet -slow or irregular heartbeat -swelling of the ankle,  feet, hands Side effects that usually do not require medical attention (report to your doctor or health care professional if they continue or are bothersome): -bone pain -complete hair loss including hair on your head, underarms, pubic hair, eyebrows, and eyelashes -changes in the color of fingernails -diarrhea -loosening of the fingernails -loss of appetite -muscle or joint pain -red flush to skin -sweating This list may not describe all possible side effects. Call your doctor for medical advice about side effects. You may report side effects to FDA at 1-800-FDA-1088. Where should I keep my medicine? This drug is given in a hospital or clinic and will not be stored at home. NOTE: This sheet is a summary. It may not cover all possible information. If you have questions about this medicine, talk to your doctor, pharmacist, or health care provider.  2018 Elsevier/Gold Standard (2015-08-09 19:58:00)  Pembrolizumab injection What is this medicine? PEMBROLIZUMAB (pem broe liz ue mab) is a monoclonal antibody. It is used to treat melanoma, head and neck cancer, Hodgkin lymphoma, non-small cell lung cancer, urothelial cancer, stomach cancer, and cancers that have a certain genetic condition. This medicine may be used for other purposes; ask your health care provider or pharmacist if you have questions. COMMON BRAND NAME(S): Keytruda What should I tell my health care provider before I take this medicine? They need to know if you have any of these conditions: -diabetes -immune system problems -inflammatory bowel disease -liver disease -lung or breathing disease -lupus -organ transplant -an unusual or allergic reaction to pembrolizumab, other medicines, foods, dyes, or preservatives -pregnant or trying to get pregnant -breast-feeding How should I use this medicine? This medicine is for infusion into a vein. It is given by a health care professional in a hospital or clinic setting. A  special MedGuide will be given to you before each treatment. Be sure to read this information carefully each time. Talk to your pediatrician regarding the use of this medicine in children. While this drug may be prescribed for selected conditions, precautions do apply. Overdosage: If you think you have taken too much of this medicine contact a poison control center or emergency room at once. NOTE: This medicine is only for you. Do not share this medicine with others. What if I miss a dose? It is important not to miss your dose. Call your doctor or health care professional if you are unable to keep an appointment. What may interact with this medicine? Interactions have not been studied. Give your health care provider a list of all the medicines, herbs, non-prescription drugs, or dietary supplements you use. Also tell them if you smoke, drink alcohol, or use illegal drugs. Some items may interact with your medicine. This list may not describe all possible interactions. Give your health care provider a list of all the medicines, herbs, non-prescription drugs, or dietary supplements you use. Also tell them if you smoke, drink alcohol, or use illegal drugs. Some items may interact with your medicine. What should I watch for while using this medicine? Your condition will be monitored carefully while you are receiving this medicine. You may need blood work done while you are taking this medicine. Do not become pregnant while taking this medicine or for 4 months after  stopping it. Women should inform their doctor if they wish to become pregnant or think they might be pregnant. There is a potential for serious side effects to an unborn child. Talk to your health care professional or pharmacist for more information. Do not breast-feed an infant while taking this medicine or for 4 months after the last dose. What side effects may I notice from receiving this medicine? Side effects that you should report to your  doctor or health care professional as soon as possible: -allergic reactions like skin rash, itching or hives, swelling of the face, lips, or tongue -bloody or black, tarry -breathing problems -changes in vision -chest pain -chills -constipation -cough -dizziness or feeling faint or lightheaded -fast or irregular heartbeat -fever -flushing -hair loss -low blood counts - this medicine may decrease the number of white blood cells, red blood cells and platelets. You may be at increased risk for infections and bleeding. -muscle pain -muscle weakness -persistent headache -signs and symptoms of high blood sugar such as dizziness; dry mouth; dry skin; fruity breath; nausea; stomach pain; increased hunger or thirst; increased urination -signs and symptoms of kidney injury like trouble passing urine or change in the amount of urine -signs and symptoms of liver injury like dark urine, light-colored stools, loss of appetite, nausea, right upper belly pain, yellowing of the eyes or skin -stomach pain -sweating -weight loss Side effects that usually do not require medical attention (report to your doctor or health care professional if they continue or are bothersome): -decreased appetite -diarrhea -tiredness This list may not describe all possible side effects. Call your doctor for medical advice about side effects. You may report side effects to FDA at 1-800-FDA-1088. Where should I keep my medicine? This drug is given in a hospital or clinic and will not be stored at home. NOTE: This sheet is a summary. It may not cover all possible information. If you have questions about this medicine, talk to your doctor, pharmacist, or health care provider.  2018 Elsevier/Gold Standard (2016-07-17 12:29:36)

## 2018-01-15 NOTE — Telephone Encounter (Signed)
[facility] CSW Progress Note  CSW received referral from nurse navigator to connect w patient re adjustment to illness.  CSW called patient who states that she continues to suffer from anxiety, depression and pain.  States she would like to talk w someone about these concerns.  Appointment scheduled for 3/28 at 2 PM in [place] office.  Edwyna Shell, LCSW Clinical Social Worker Phone:  [phone]

## 2018-01-15 NOTE — Progress Notes (Signed)
START ON PATHWAY REGIMEN - Non-Small Cell Lung     A cycle is every 21 days:     Pembrolizumab      Paclitaxel      Carboplatin   **Always confirm dose/schedule in your pharmacy ordering system**    Patient Characteristics: Stage IV Metastatic, Squamous, PS = 0, 1, First Line, PD-L1 Expression Positive 1-49% (TPS) / Negative / Not Tested / Awaiting Test Results AJCC T Category: T4 Current Disease Status: Distant Metastases AJCC N Category: N2 AJCC M Category: M1b AJCC 8 Stage Grouping: IVA Histology: Squamous Cell Line of therapy: First Line PD-L1 Expression Status: Awaiting Test Results Performance Status: PS = 0, 1  Intent of Therapy: Non-Curative / Palliative Intent, Discussed with Patient

## 2018-01-15 NOTE — Progress Notes (Signed)
[facility] OFFICE PROGRESS NOTE  [name], MD [place] [address]  DIAGNOSIS: Stage IVnon-small cell lung cancer, squamous cell carcinoma presented with large right hilar mass with encasement of the right pulmonary artery as well as superior vena cava and right upper lobe collapse in addition to precarinal lymphadenopathy diagnosed in February 2019.  PRIOR THERAPY: None.  CURRENT THERAPY:  1) SRS to the brain lesion and palliative radiation to the right upper lobe of the lung lesion.  The patient is due to complete her therapy on 01/17/2018. 2) palliative systemic chemotherapy with carboplatin for an AUC of 5, paclitaxel 175 mg/m, and Keytruda 200 mg IV given every 3 weeks.  First dose expected 01/29/2018.  INTERVAL HISTORY: Beth Alexander 67 y.o. female returns for routine follow-up visit accompanied by her sister and her nephew.  The patient is feeling fine today and has no specific complaints.  She is currently undergoing a course of radiation to her RIGHT chest and is expected to have SRS to the brain later this week.  She denies any odynophagia.  Denies fevers and chills.  Denies chest pain, shortness breath, cough, hemoptysis.  Denies nausea, vomiting, constipation, diarrhea.  The patient has lost about 6 pounds since her last visit but denies any change in her appetite.  The patient is here for evaluation and discussion of her treatment options.  MEDICAL HISTORY: Past Medical History:  Diagnosis Date  . Hypertension   . Tobacco use     ALLERGIES:  has No Known Allergies.  MEDICATIONS:  Current Outpatient Medications  Medication Sig Dispense Refill  . acetaminophen (TYLENOL) 500 MG tablet Take 500 mg by mouth every 6 (six) hours as needed for mild pain or headache.    . albuterol (PROVENTIL HFA;VENTOLIN HFA) 108 (90 Base) MCG/ACT inhaler     . amLODipine (NORVASC) 5 MG tablet Take 1 tablet (5 mg total) by mouth daily. 30 tablet 5  .  aspirin EC 81 MG tablet Take 81 mg by mouth daily.    Marland Kitchen dexamethasone (DECADRON) 4 MG tablet Take 1 tablet (4 mg total) by mouth 2 (two) times daily with a meal. 28 tablet 0  . dextromethorphan-guaiFENesin (MUCINEX DM) 30-600 MG 12hr tablet Take 1 tablet by mouth 2 (two) times daily as needed for cough.    Marland Kitchen ibuprofen (ADVIL,MOTRIN) 200 MG tablet Take 200 mg by mouth every 6 (six) hours as needed for fever or moderate pain.    Marland Kitchen LORazepam (ATIVAN) 0.5 MG tablet 1 tablet po 30 minutes prior to radiation or MRI, or every 4-6 hours prn anxiety 60 tablet 0  . Menthol-Methyl Salicylate (MUSCLE RUB) 10-15 % CREA Apply 1 application topically daily as needed for muscle pain.     . nicotine (NICODERM CQ - DOSED IN MG/24 HOURS) 14 mg/24hr patch Place 1 patch (14 mg total) onto the skin daily. 28 patch 0  . oxyCODONE (OXY IR/ROXICODONE) 5 MG immediate release tablet Take 1 tablet (5 mg total) by mouth every 4 (four) hours as needed for severe pain. 60 tablet 0  . tiZANidine (ZANAFLEX) 4 MG tablet     . prochlorperazine (COMPAZINE) 10 MG tablet Take 1 tablet (10 mg total) by mouth every 6 (six) hours as needed for nausea or vomiting. 30 tablet 1   No current facility-administered medications for this visit.     SURGICAL HISTORY:  Past Surgical History:  Procedure Laterality Date  . BREAST LUMPECTOMY WITH RADIOACTIVE SEED LOCALIZATION Left 11/08/2016  Procedure: LEFT BREAST LUMPECTOMY WITH RADIOACTIVE SEED LOCALIZATION;  Surgeon: Donnie Mesa, MD;  Location: [facility];  Service: General;  Laterality: Left;  Marland Kitchen VIDEO BRONCHOSCOPY WITH ENDOBRONCHIAL ULTRASOUND N/A 12/11/2017   Procedure: VIDEO BRONCHOSCOPY WITH ENDOBRONCHIAL ULTRASOUND;  Surgeon: Marshell Garfinkel, MD;  Location: [facility];  Service: Pulmonary;  Laterality: N/A;    REVIEW OF SYSTEMS:   Review of Systems  Constitutional: Negative for appetite change, chills, fatigue, fever. Positive for weight loss. HENT:   Negative for mouth  sores, nosebleeds, sore throat and trouble swallowing.   Eyes: Negative for eye problems and icterus.  Respiratory: Negative for cough, hemoptysis, shortness of breath and wheezing.   Cardiovascular: Negative for chest pain and leg swelling.  Gastrointestinal: Negative for abdominal pain, constipation, diarrhea, nausea and vomiting.  Genitourinary: Negative for bladder incontinence, difficulty urinating, dysuria, frequency and hematuria.   Musculoskeletal: Negative for back pain, gait problem, neck pain and neck stiffness.  Skin: Negative for itching and rash.  Neurological: Negative for dizziness, extremity weakness, gait problem, headaches, light-headedness and seizures.  Hematological: Negative for adenopathy. Does not bruise/bleed easily.  Psychiatric/Behavioral: Negative for confusion, depression and sleep disturbance. The patient is not nervous/anxious.     PHYSICAL EXAMINATION:  Blood pressure (!) 120/55, pulse 80, temperature 98 F (36.7 C), temperature source Oral, resp. rate 18, height 5\' 3"  (1.6 m), weight 127 lb 12.8 oz (58 kg), SpO2 100 %.  ECOG PERFORMANCE STATUS: 1 - Symptomatic but completely ambulatory  Physical Exam  Constitutional: Oriented to person, place, and time and well-developed, well-nourished, and in no distress. No distress.  HENT:  Head: Normocephalic and atraumatic.  Mouth/Throat: Oropharynx is clear and moist. No oropharyngeal exudate.  Eyes: Conjunctivae are normal. Right eye exhibits no discharge. Left eye exhibits no discharge. No scleral icterus.  Neck: Normal range of motion. Neck supple.  Cardiovascular: Normal rate, regular rhythm, normal heart sounds and intact distal pulses.   Pulmonary/Chest: Effort normal and breath sounds normal. No respiratory distress. No wheezes. No rales.  Abdominal: Soft. Bowel sounds are normal. Exhibits no distension and no mass. There is no tenderness.  Musculoskeletal: Normal range of motion. Exhibits no edema.   Lymphadenopathy:    No cervical adenopathy.  Neurological: Alert and oriented to person, place, and time. Exhibits normal muscle tone. Gait normal. Coordination normal.  Skin: Skin is warm and dry. No rash noted. Not diaphoretic. No erythema. No pallor.  Psychiatric: Mood, memory and judgment normal.  Vitals reviewed.  LABORATORY DATA: Lab Results  Component Value Date   WBC 11.3 (H) 01/15/2018   HGB 11.3 (L) 12/10/2017   HCT 37.6 01/15/2018   MCV 92.2 01/15/2018   PLT 426 (H) 01/15/2018      Chemistry      Component Value Date/Time   NA 136 01/15/2018 1035   K 3.8 01/15/2018 1035   CL 100 01/15/2018 1035   CO2 29 01/15/2018 1035   BUN 16 01/15/2018 1035   CREATININE 0.66 01/15/2018 1035      Component Value Date/Time   CALCIUM 9.8 01/15/2018 1035   ALKPHOS 56 01/15/2018 1035   AST 9 01/15/2018 1035   ALT 14 01/15/2018 1035   BILITOT 0.4 01/15/2018 1035       RADIOGRAPHIC STUDIES:  Mr Jeri Cos JO Contrast  Result Date: 01/10/2018 CLINICAL DATA:  Metastatic disease stereotactic radiosurgery planning EXAM: MRI HEAD WITHOUT AND WITH CONTRAST TECHNIQUE: Multiplanar, multiecho pulse sequences of the brain and surrounding structures were obtained without and with intravenous  contrast. CONTRAST:  66mL MULTIHANCE GADOBENATE DIMEGLUMINE 529 MG/ML IV SOLN COMPARISON:  Brain MRI 12/27/2017 FINDINGS: Brain: The midline structures are normal. There is no acute infarct or acute hemorrhage. Peripherally contrast-enhancing lesion centered in the right thalamus is unchanged in size measuring 2.0 x 1.7 cm. No surrounding edema. Multifocal white matter hyperintensity, most commonly due to chronic ischemic microangiopathy. No age-advanced or lobar predominant atrophy. No chronic microhemorrhage or superficial siderosis. Vascular: Major intracranial arterial and venous sinus flow voids are preserved. Skull and upper cervical spine: The visualized skull base, calvarium, upper cervical spine and  extracranial soft tissues are normal. Sinuses/Orbits: No fluid levels or advanced mucosal thickening. No mastoid or middle ear effusion. Normal orbits. IMPRESSION: Unchanged solitary lesion in the right thalamus. Electronically Signed   By: Ulyses Jarred M.D.   On: 01/10/2018 22:07   Mr Jeri Cos TF Contrast  Result Date: 12/27/2017 CLINICAL DATA:  New diagnosis lung cancer.  Staging. EXAM: MRI HEAD WITHOUT AND WITH CONTRAST TECHNIQUE: Multiplanar, multiecho pulse sequences of the brain and surrounding structures were obtained without and with intravenous contrast. CONTRAST:  5mL MULTIHANCE GADOBENATE DIMEGLUMINE 529 MG/ML IV SOLN COMPARISON:  None. FINDINGS: Brain: The brainstem and cerebellum are normal. Within the right thalamus, there is a necrotic metastasis measuring 2.3 cm in diameter. Very minimal surrounding edema. No second lesion is identified. There are ordinary mild chronic small-vessel ischemic changes of the hemispheric white matter. No cortical abnormality. No hydrocephalus or extra-axial collection. Vascular: Major vessels at the base of the brain show flow. Skull and upper cervical spine: Negative Sinuses/Orbits: Clear/normal. Old medial wall blowout fracture on the right. 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 No significant edema reaction. No second lesion. Electronically Signed   By: Nelson Chimes M.D.   On: 12/27/2017 13:05   Nm Pet Image Initial (pi) Skull Base To Thigh  Result Date: 12/27/2017 CLINICAL DATA:  Initial treatment strategy for lung cancer. EXAM: NUCLEAR MEDICINE PET SKULL BASE TO THIGH TECHNIQUE: His 6.87 mCi F-18 FDG was injected intravenously. Full-ring PET imaging was performed from the skull base to thigh after the radiotracer. CT data was obtained and used for attenuation correction and anatomic localization. Fasting blood glucose: 97 mg/dl Mediastinal blood pool activity: SUV max 2.23 COMPARISON:  None.  FINDINGS: NECK: No hypermetabolic lymph nodes in the neck. Incidental CT findings: none CHEST: Large hypermetabolic mass within the right upper lobe invading the right hilum and right paratracheal region measuring 6.5 cm with an SUV max equal to 20.64. The tumor has mass effect upon the right lateral and ventral wall of the trachea. There is encasement and complete occlusion of the right upper lobe bronchus with postobstructive consolidation of the right upper lobe. Two separate hypermetabolic lesions are identified within the right upper lobe. Central right upper lobe lesion measures 2.5 cm and has an SUV max equal to 17.36. Within the posterior right upper lobe there is a 2.1 cm lesion within SUV max of 26. Associated posterior chest wall invasion is suspected, image 49/4. There is a small right pleural effusion. A hypermetabolic pleural base nodule which extends into the posterior chest wall is noted. This measures approximately 3.3 cm and has an SUV max equal to 24. Associated erosive changes along the right ninth rib is identified, image 82/4. Incidental CT findings: There is moderate cardiac enlargement and aortic atherosclerosis. Calcification in the LAD, left circumflex and RCA coronary artery noted. ABDOMEN/PELVIS: No abnormal hypermetabolic activity within the  liver, pancreas, adrenal glands, or spleen. There is a hypermetabolic mass arising from the upper pole of right kidney corresponding to an area of relative hypoattenuation on CT from 12/10/2017. This mass measures approximately 3.1 cm and has an SUV max of 16.17. Favor metastatic disease. Similarly there is a small cortical based hypermetabolic lesion arising from the inferior pole of the left kidney with anSUV max of 9.6. Small retrocaval lymph node is hypermetabolic with an SUV max of 6.5. Incidental CT findings: none SKELETON: No focal hypermetabolic activity to suggest skeletal metastasis. Incidental CT findings: none IMPRESSION: 1. Large mass  involving the right paratracheal region, right hilar and medial right upper lobe identified compatible with primary bronchogenic carcinoma. Complete occlusion of the right upper lobe bronchus with postobstructive pneumonitis is identified. There are 2 additional separate lesions within the right upper lobe. One of these appears to invade the posterior chest wall. Additionally, there is a right pleural effusion and a pleural base mass at the posterior right lung base which also invades the posterior chest wall. Bilateral renal metastasis are suspected as well as left retroperitoneal nodal metastasis. Assuming a non-small cell histology imaging findings are compatible with a [id number] lesion or stage IV disease. Electronically Signed   By: Kerby Moors M.D.   On: 12/27/2017 13:30     ASSESSMENT/PLAN:  Squamous cell carcinoma of right lung [facility]) This is a very pleasant 66 year old African-American female recently diagnosed with stage IV non-small cell lung cancer, squamous cell carcinoma presented with large right hilar mass with encasement of the right pulmonary artery as well as superior vena cava and right upper lobe collapse in addition to precarinal lymphadenopathy diagnosed in February 2019. She is due to complete a course of radiation to her right lung mass and to have SRS to the brain at the end this week. The patient is here to discuss additional treatment options.  The patient was seen with Dr. Julien Nordmann.  We again discussed the patient's diagnosis, prognosis, and treatment options.  We discussed that her disease is not curable.  Treatment options were reviewed with the patient and her family including chemotherapy plus immunotherapy versus referral for palliative care/hospice.  We discussed that chemotherapy would consist of carboplatin for an AUC of 5, paclitaxel 175 mg/m, and Keytruda 200 mg IV given every 3 weeks x4 cycles and then beginning with cycle 5 she would continue on Keytruda as a single  agent.  The patient is interested in pursuing treatment. Discussed with the patient adverse effects of this treatment including but not limited to alopecia, myelosuppression, nausea and vomiting, peripheral neuropathy, liver or renal dysfunction in addition to the adverse effects of  immune therapy including but not limited to immune mediated the skin rash, diarrhea, inflammation of the lung, kidney, liver, thyroid or other endocrine dysfunction. She will complete radiation later this week and then we will give her 1 additional week to recover.  Anticipate that she will begin her treatment around 01/29/2018. A prescription for Compazine 10 mg every 6 hours as needed for nausea and vomiting was sent to her pharmacy. She will be set up for a chemotherapy education class. The patient will have weekly labs while receiving her chemotherapy. She will follow-up in approximately 2 weeks for evaluation prior to cycle #1 of her treatment.  For weight loss, I have made a referral to the dietitian.  She was advised to call immediately if she has any concerning symptoms in the interval. The patient voices understanding of  current disease status and treatment options and is in agreement with the current care plan.  All questions were answered. The patient knows to call the clinic with any problems, questions or concerns. We can certainly see the patient much sooner if necessary.   Orders Placed This Encounter  Procedures  . CBC with Differential (Cancer Center Only)    Standing Status:   Standing    Number of Occurrences:   20    Standing Expiration Date:   01/16/2019  . CMP ([facility] only)    Standing Status:   Standing    Number of Occurrences:   20    Standing Expiration Date:   01/16/2019  . TSH    Standing Status:   Standing    Number of Occurrences:   20    Standing Expiration Date:   01/16/2019  . Amb Referral to Nutrition and Diabetic E    Referral Priority:   Routine    Referral Type:    Consultation    Referral Reason:   Specialty Services Required    Number of Visits Requested:   1   Beth Bussing, DNP, AGPCNP-BC, AOCNP 01/15/18  ADDENDUM: Hematology/Oncology Attending: I had a face-to-face encounter with the patient today.  I recommended her care plan.  This is a very pleasant 67 years old African-American female recently diagnosed with stage IV non-small cell lung cancer, squamous cell carcinoma presented with large right hilar mass with encasement of the right pulmonary artery as well as superior vena cava in addition to mediastinal lymphadenopathy and metastatic brain lesions.  The patient underwent stereotactic radiotherapy to the brain lesion as well as palliative radiotherapy to the large right hilar mass.  She is expected to complete the course of palliative radiotherapy to the chest later this week. I had a lengthy discussion with the patient today about her current condition and treatment options.  The patient understands that she has an incurable condition and all the treatment will be of palliative nature.  I discussed with the patient the option of palliative care and hospice referral versus consideration of palliative systemic chemotherapy with carboplatin for AUC of 5, paclitaxel 175 mg/M2 as well as Keytruda 200 mg IV every 3 weeks.  This will be followed by maintenance Keytruda as a single agent if she has no evidence for disease progression on the induction phase of the regimen.  The patient is interested in proceeding with the palliative systemic chemotherapy.  She is expected to start the first dose of this treatment in 2 weeks.  I discussed with her the adverse effect of this treatment including but not limited to alopecia, myelosuppression, nausea and vomiting, peripheral neuropathy, liver or renal dysfunction as well as the adverse effect of the immunotherapy. The patient will come back for follow-up visit in 2 weeks with the start of the first cycle of her  treatment.  She was advised to call immediately if she has any concerning symptoms in the interval.  Disclaimer: This note was dictated with voice recognition software. Similar sounding words can inadvertently be transcribed and may be missed upon review. Eilleen Kempf, MD 01/15/18

## 2018-01-15 NOTE — Assessment & Plan Note (Addendum)
This is a very pleasant 67 year old African-American female recently diagnosed with stage IV non-small cell lung cancer, squamous cell carcinoma presented with large right hilar mass with encasement of the right pulmonary artery as well as superior vena cava and right upper lobe collapse in addition to precarinal lymphadenopathy diagnosed in February 2019. She is due to complete a course of radiation to her right lung mass and to have SRS to the brain at the end this week. The patient is here to discuss additional treatment options.  The patient was seen with Dr. Julien Nordmann.  We again discussed the patient's diagnosis, prognosis, and treatment options.  We discussed that her disease is not curable.  Treatment options were reviewed with the patient and her family including chemotherapy plus immunotherapy versus referral for palliative care/hospice.  We discussed that chemotherapy would consist of carboplatin for an AUC of 5, paclitaxel 175 mg/m, and Keytruda 200 mg IV given every 3 weeks x4 cycles and then beginning with cycle 5 she would continue on Keytruda as a single agent.  The patient is interested in pursuing treatment. Discussed with the patient adverse effects of this treatment including but not limited to alopecia, myelosuppression, nausea and vomiting, peripheral neuropathy, liver or renal dysfunction in addition to the adverse effects of immune therapy including but not limited to immune mediated the skin rash, diarrhea, inflammation of the lung, kidney, liver, thyroid or other endocrine dysfunction. She will complete radiation later this week and then we will give her 1 additional week to recover.  Anticipate that she will begin her treatment around 01/29/2018. A prescription for Compazine 10 mg every 6 hours as needed for nausea and vomiting was sent to her pharmacy. She will be set up for a chemotherapy education class. The patient will have weekly labs while receiving her chemotherapy. She will  follow-up in approximately 2 weeks for evaluation prior to cycle #1 of her treatment.  For weight loss, I have made a referral to the dietitian.  She was advised to call immediately if she has any concerning symptoms in the interval. The patient voices understanding of current disease status and treatment options and is in agreement with the current care plan.  All questions were answered. The patient knows to call the clinic with any problems, questions or concerns. We can certainly see the patient much sooner if necessary.

## 2018-01-15 NOTE — Telephone Encounter (Signed)
Scheduled appt per 3/27 los - patient is aware of appt date and time.

## 2018-01-16 ENCOUNTER — Telehealth: Payer: Self-pay

## 2018-01-16 ENCOUNTER — Encounter: Payer: Self-pay | Admitting: General Practice

## 2018-01-16 ENCOUNTER — Ambulatory Visit
Admission: RE | Admit: 2018-01-16 | Discharge: 2018-01-16 | Disposition: A | Discharge status: Home / Self Care | Payer: Medicare HMO | Source: Ambulatory Visit | Attending: Radiation Oncology | Admitting: Radiation Oncology

## 2018-01-16 DIAGNOSIS — Z510 Encounter for antineoplastic radiation therapy: Secondary | ICD-10-CM | POA: Diagnosis not present

## 2018-01-16 NOTE — Progress Notes (Signed)
[facility] Psychosocial Assessment Clinical Social Work  Clinical Social Work was referred by patient navigator.  Clinical Social Worker Edwyna Shell met with patient in office to assess psychosocial, emotional, mental health, and spiritual needs of the patient.  Patient's knowledge about cancer and its treatment including level of understanding, reactions, goals for care, and expectations:  Diagnosed w lung cancer during work up for pneumonia, asymptomatic prior to that time.  Has taken diagnosis hard, became very depressed (9 out of 10) upon diagnosis.  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  Just finishing radiation, will then have chemotherapy treatment.  Significant fatigue post treatment.   Characteristics of the patient's support system:  No contact/support from her two adult children.  Brother lives in her home - suffers from depression. "He is making it worse for me - he is so depressed he doesn't want to do anything at all."  Conflicted about asking him to leave vs being a caregiver for him.  Sister [name] drives patient to appointments and provides emotional support.  Attends church but has not reached out to pastor or others in church.  Tends to value her privacy.    Patient and family psychosocial functioning including strengths, limitations, and coping skills:  Brother w significant depression lives in the home, patient is caregiver for him but is now less able to do this.  Used to working hard and taking care of her own needs.  Spiritual strength  Identifications of barriers to care:  Significant depressive symptoms, fatigue, lack of motivation and hope  Availability of community resources:  Insured, can access resources.  Does not drive herself.  Has not accessed supportive resources for cancer patients.  Clinical Social Worker follow up needed: Yes.    If yes, follow up plan:  Patient will return  weekly for counseling w [facility] CSW.  Referred to [facility] OP for meds mgmt appt, will also link w PCP for bridge appointment.  Patient open to antidepressants.  Encouraged patient to try to walk daily in order to increase physical activity.    Edwyna Shell, LCSW Clinical Social Worker Phone:  [phone]

## 2018-01-16 NOTE — Telephone Encounter (Signed)
Completed by MX. Printed avs and calender from [employer] Per 3/27 los

## 2018-01-17 ENCOUNTER — Inpatient Hospital Stay: Payer: Medicare HMO

## 2018-01-17 ENCOUNTER — Ambulatory Visit
Admission: RE | Admit: 2018-01-17 | Discharge: 2018-01-17 | Disposition: A | Discharge status: Home / Self Care | Payer: Medicare HMO | Source: Ambulatory Visit | Attending: Radiation Oncology | Admitting: Radiation Oncology

## 2018-01-17 ENCOUNTER — Encounter: Payer: Self-pay | Admitting: Radiation Oncology

## 2018-01-17 DIAGNOSIS — C7931 Secondary malignant neoplasm of brain: Secondary | ICD-10-CM

## 2018-01-17 DIAGNOSIS — Z510 Encounter for antineoplastic radiation therapy: Secondary | ICD-10-CM | POA: Diagnosis not present

## 2018-01-17 MED ORDER — DEXAMETHASONE 4 MG PO TABS: 4.0000 mg | ORAL_TABLET | Freq: Two times a day (BID) | ORAL | 0 refills | Status: DC

## 2018-01-17 NOTE — Op Note (Signed)
  Name: Beth Alexander  MRN: [id number]  Date: 01/17/2018   DOB: [birth year]  Stereotactic Radiosurgery Operative Note  PRE-OPERATIVE DIAGNOSIS:  Solitary Brain Metastasis  POST-OPERATIVE DIAGNOSIS:  Solitary Brain Metastasis  PROCEDURE:  Stereotactic Radiosurgery  SURGEON:  Peggyann Shoals, MD  NARRATIVE: The patient underwent a radiation treatment planning session in the radiation oncology simulation suite under the care of the radiation oncology physician and physicist.  I participated closely in the radiation treatment planning afterwards. The patient underwent planning CT which was fused to 3T high resolution MRI with 1 mm axial slices.  These images were fused on the planning system.  We contoured the gross target volumes and subsequently expanded this to yield the Planning Target Volume. I actively participated in the planning process.  I helped to define and review the target contours and also the contours of the optic pathway, eyes, brainstem and selected nearby organs at risk.  All the dose constraints for critical structures were reviewed and compared to AAPM Task Group 101.  The prescription dose conformity was reviewed.  I approved the plan electronically.    Accordingly, Beth Alexander was brought to the TrueBeam stereotactic radiation treatment linac and placed in the custom immobilization mask.  The patient was aligned according to the IR fiducial markers with BrainLab Exactrac, then orthogonal x-rays were used in ExacTrac with the 6DOF robotic table and the shifts were made to align the patient  Beth Alexander received stereotactic radiosurgery uneventfully.    The detailed description of the procedure is recorded in the radiation oncology procedure note.  I was present for the duration of the procedure.  DISPOSITION:  Following delivery, the patient was transported to nursing in stable condition and monitored for possible acute effects to be discharged to home in stable condition with  follow-up in one month.  Peggyann Shoals, MD 01/17/2018 4:38 PM

## 2018-01-17 NOTE — Progress Notes (Signed)
Decadron patient instructions Take 1 tablet decadron (4mg ) x2 per day for 5 days. Then take 1 tablet x1 per day for 5 days. Then take 1/2 tablet x1 per day for 5 days. Then take 1/2 tablet every other day for 5 days. Then stop medication.

## 2018-01-17 NOTE — Progress Notes (Signed)
Patient tolerated SRS treatment well no complaints.  She is currently taking Decadron 4mg  BID.  She was instructed to continue the Decadron.  Informed to call the on call physician if she had any reactions or issues over the weekend.  Taper instructions given by Dr. Lisbeth Renshaw.  Will continue to follow as necessary.  BP 117/71 (BP Location: Right Arm)   Pulse 93   Temp 98.1 F (36.7 C) (Oral)   Resp 18   SpO2 100%   Cori Razor, RN

## 2018-01-20 DIAGNOSIS — C7931 Secondary malignant neoplasm of brain: Secondary | ICD-10-CM | POA: Insufficient documentation

## 2018-01-20 DIAGNOSIS — C3411 Malignant neoplasm of upper lobe, right bronchus or lung: Secondary | ICD-10-CM | POA: Insufficient documentation

## 2018-01-20 NOTE — Progress Notes (Signed)
The patient was offered enrollment on our single institutional trial investigating open-faced vs closed-face head masks used to immobilize patients during stereotactic brain radiosurgery. The patient has elected to enroll on this trial. In regards to the trial, the patient has voluntarilysigned copiesof the consent forms and all trial related questions were answered.   Ms. Roorda drew  # [phone] and randomized to have the brain lab mask. All paperwork was filled out and given to Claudean Severance for documentation.   Mont Dutton R.T.(R)(T) Special Procedures Navigator

## 2018-01-20 NOTE — Progress Notes (Signed)
  Radiation Oncology         [phone] ________________________________  Name: Beth Alexander MRN: [id number]  Date: 01/17/2018  DOB: Sep 14, 1951   SPECIAL TREATMENT PROCEDURE   3D TREATMENT PLANNING AND DOSIMETRY: The patient's radiation plan was reviewed and approved by Dr. Vertell Limber from neurosurgery and radiation oncology prior to treatment. It showed 3-dimensional radiation distributions overlaid onto the planning CT/MRI image set. [facility] for the target structures as well as the organs at risk were reviewed. The documentation of the 3D plan and dosimetry are filed in the radiation oncology EMR.   NARRATIVE: The patient was brought to the TrueBeam stereotactic radiation treatment machine and placed supine on the CT couch. The head frame was applied, and the patient was set up for stereotactic radiosurgery. Neurosurgery was present for the set-up and delivery   SIMULATION VERIFICATION: In the couch zero-angle position, the patient underwent Exactrac imaging using the Brainlab system with orthogonal KV images. These were carefully aligned and repeated to confirm treatment position for each of the isocenters. The Exactrac snap film verification was repeated at each couch angle.   SPECIAL TREATMENT PROCEDURE: The patient received stereotactic radiosurgery to the following target:  PTV1 Rt Thalamus 86mm target was treated using 4 Arcs to a prescription dose of 20 Gy. ExacTrac Snap verification was performed for each couch angle.   STEREOTACTIC TREATMENT MANAGEMENT: Following delivery, the patient was transported to nursing in stable condition and monitored for possible acute effects. Vital signs were recorded . The patient tolerated treatment without significant acute effects, and was discharged to home in stable condition.  PLAN: Follow-up in one month.   ------------------------------------------------  Jodelle Gross, MD, PhD

## 2018-01-20 NOTE — Progress Notes (Signed)
  Radiation Oncology         [phone]) [phone] ________________________________  Name: Beth Alexander MRN: [id number]  Date: 01/02/2018  DOB: September 07, 1951  SIMULATION AND TREATMENT PLANNING NOTE  DIAGNOSIS:     ICD-10-CM   1. 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  Identity was confirmed.  All relevant records and images related to the planned course of therapy were reviewed.   Written consent to proceed with treatment was confirmed which was freely given after reviewing the details related to the planned course of therapy had been reviewed with the patient.  Then, the patient was set-up in a stable reproducible  supine position for radiation therapy.  CT images were obtained.  Surface markings were placed.    Medically necessarytreatment device(s) for immobilization: Wing board device.   The CT images were loaded into the planning software.  Then the target and avoidance structures were contoured.  Treatment planning then occurred.  The radiation prescription was entered and confirmed.  A total of 4 complex treatment devices were fabricated which relate to the designed radiation treatment fields. Each of these customized fields/ complex treatment devices will be used on a daily basis during the radiation course. I have requested : 3D Simulation  I have requested a DVH of the following structures: Target volume, lungs, spinal cord.   The patient will undergo daily image guidance to ensure accurate localization of the target, and adequate minimize dose to the normal surrounding structures in close proximity to the target.  PLAN:  The patient will receive 30 Gy in 10 fractions.  ________________________________   Jodelle Gross, MD, PhD

## 2018-01-20 NOTE — Progress Notes (Signed)
  Radiation Oncology         ([phone]) [phone] ________________________________  Name: Beth Alexander MRN: [id number]  Date: 01/14/2018  DOB: Jun 14, 1951  DIAGNOSIS:     ICD-10-CM   1. Brain metastasis ([place]) C79.31     NARRATIVE:  The patient was brought to the [facility].  Identity was confirmed.  All relevant records and images related to the planned course of therapy were reviewed.  The patient freely provided informed written consent to proceed with treatment after reviewing the details related to the planned course of therapy. The consent form was witnessed and verified by the simulation staff. Intravenous access was established for contrast administration. Then, the patient was set-up in a stable reproducible supine position for radiation therapy.  A relocatable thermoplastic stereotactic head frame was fabricated for precise immobilization.  CT images were obtained.  Surface markings were placed.  The CT images were loaded into the planning software and fused with the patient's targeting MRI scan.  Then the target and avoidance structures were contoured.  Treatment planning then occurred.  The radiation prescription was entered and confirmed.  I have requested 3D planning  I have requested a DVH of the following structures: Brain stem, brain, left eye, right eye, lenses, optic chiasm, target volumes, uninvolved brain, and normal tissue.    SPECIAL TREATMENT PROCEDURE:  The planned course of therapy using radiation constitutes a special treatment procedure. Special care is required in the management of this patient for the following reasons. This treatment constitutes a Special Treatment Procedure for the following reason: High dose per fraction requiring special monitoring for increased toxicities of treatment including daily imaging.  The special nature of the planned course of radiotherapy will require increased physician supervision and oversight to ensure patient's safety with optimal  treatment outcomes.  PLAN:  The patient will receive 20 Gy in 1 fraction.   ------------------------------------------------  Jodelle Gross, MD, PhD

## 2018-01-21 ENCOUNTER — Encounter: Payer: Self-pay | Admitting: General Practice

## 2018-01-21 ENCOUNTER — Emergency Department (HOSPITAL_COMMUNITY)
Admission: EM | Admit: 2018-01-21 | Discharge: 2018-01-21 | Disposition: A | Discharge status: Home / Self Care | Payer: Medicare HMO | Attending: Emergency Medicine | Admitting: Emergency Medicine

## 2018-01-21 ENCOUNTER — Encounter (HOSPITAL_COMMUNITY): Payer: Self-pay | Admitting: Emergency Medicine

## 2018-01-21 ENCOUNTER — Telehealth (HOSPITAL_COMMUNITY): Payer: Self-pay | Admitting: Internal Medicine

## 2018-01-21 DIAGNOSIS — Z87891 Personal history of nicotine dependence: Secondary | ICD-10-CM | POA: Insufficient documentation

## 2018-01-21 DIAGNOSIS — M62838 Other muscle spasm: Secondary | ICD-10-CM | POA: Diagnosis present

## 2018-01-21 DIAGNOSIS — Z7982 Long term (current) use of aspirin: Secondary | ICD-10-CM | POA: Diagnosis not present

## 2018-01-21 DIAGNOSIS — I10 Essential (primary) hypertension: Secondary | ICD-10-CM | POA: Insufficient documentation

## 2018-01-21 DIAGNOSIS — C7931 Secondary malignant neoplasm of brain: Secondary | ICD-10-CM | POA: Diagnosis not present

## 2018-01-21 DIAGNOSIS — C3411 Malignant neoplasm of upper lobe, right bronchus or lung: Secondary | ICD-10-CM | POA: Diagnosis not present

## 2018-01-21 LAB — CBC WITH DIFFERENTIAL/PLATELET
Basophils Absolute: 0 10*3/uL (ref 0.0–0.1)
Basophils Relative: 0 %
Eosinophils Absolute: 0 10*3/uL (ref 0.0–0.7)
Eosinophils Relative: 0 %
HEMATOCRIT: 36.4 % (ref 36.0–46.0)
HEMOGLOBIN: 12.6 g/dL (ref 12.0–15.0)
Lymphocytes Relative: 2 %
Lymphs Abs: 0.1 10*3/uL — ABNORMAL LOW (ref 0.7–4.0)
MCH: 31.9 pg (ref 26.0–34.0)
MCHC: 34.6 g/dL (ref 30.0–36.0)
MCV: 92.2 fL (ref 78.0–100.0)
Monocytes Absolute: 0.1 10*3/uL (ref 0.1–1.0)
Monocytes Relative: 1 %
Neutro Abs: 9.3 10*3/uL — ABNORMAL HIGH (ref 1.7–7.7)
Neutrophils Relative %: 97 %
Platelets: 292 10*3/uL (ref 150–400)
RBC: 3.95 MIL/uL (ref 3.87–5.11)
RDW: 13.8 % (ref 11.5–15.5)
WBC: 9.5 10*3/uL (ref 4.0–10.5)

## 2018-01-21 LAB — COMPREHENSIVE METABOLIC PANEL
ALK PHOS: 56 U/L (ref 38–126)
ALT: 17 U/L (ref 14–54)
ANION GAP: 13 (ref 5–15)
AST: 17 U/L (ref 15–41)
Albumin: 3 g/dL — ABNORMAL LOW (ref 3.5–5.0)
BILIRUBIN TOTAL: 0.4 mg/dL (ref 0.3–1.2)
BUN: 20 mg/dL (ref 6–20)
CO2: 23 mmol/L (ref 22–32)
CREATININE: 0.72 mg/dL (ref 0.44–1.00)
Calcium: 8.6 mg/dL — ABNORMAL LOW (ref 8.9–10.3)
Chloride: 100 mmol/L — ABNORMAL LOW (ref 101–111)
GFR calc non Af Amer: >60 mL/min (ref >60)
GLUCOSE: 301 mg/dL — AB (ref 65–99)
Potassium: 3.9 mmol/L (ref 3.5–5.1)
Sodium: 136 mmol/L (ref 135–145)
TOTAL PROTEIN: 6.6 g/dL (ref 6.5–8.1)

## 2018-01-21 NOTE — ED Triage Notes (Signed)
Pt was out eating and both hands drew up and got numb about 30 minutes. Pt finished radiation for cancer couple.

## 2018-01-21 NOTE — Progress Notes (Signed)
[facility] CSW Progress Note  CSW referred patient for evaluation of ongoing depression.  Patient has appt w [facility] (Dr. Adele Schilder) on 01/30/18 at 1:15pm.  Provider is attempting to contact patient to confirm this appointment.  Edwyna Shell, LCSW Clinical Social Worker Phone:  [phone]

## 2018-01-21 NOTE — ED Provider Notes (Signed)
[facility] DEPT Provider Note   CSN: [id number] Arrival date & time: 01/21/18  1718     History   Chief Complaint Chief Complaint  Patient presents with  . hand cramped    HPI Beth Alexander is a 67 y.o. female.  67 year-old female presents with acute onset of bilateral hand cramping which began prior to arrival.  Symptoms lasted for approximately 30 minutes.  They resolved spontaneously.  She had no associated headache, emesis.  No weakness in arms or legs.  No slurred speech or visual changes.  No prior history of same.  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  No reported seizure activity noted.  Suspect her baseline.  Patient feels at her baseline     Past Medical History:  Diagnosis Date  . Hypertension   . Tobacco use     Patient Active Problem List   Diagnosis Date Noted  . Brain metastasis ([place]) 01/20/2018  . Malignant neoplasm of bronchus of right upper lobe ([place]) 01/20/2018  . Encounter for antineoplastic chemotherapy 01/15/2018  . Encounter for antineoplastic immunotherapy 01/15/2018  . Goals of care, counseling/discussion 12/31/2017  . Squamous cell carcinoma of right lung ([place]) 12/18/2017  . Lung mass 12/10/2017  . Pleural effusion on right 12/10/2017  . Hypokalemia 12/10/2017  . Hypertension   . Tobacco use   . 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 BREAST LUMPECTOMY WITH RADIOACTIVE SEED LOCALIZATION Left 11/08/2016   Procedure: LEFT BREAST LUMPECTOMY WITH RADIOACTIVE SEED LOCALIZATION;  Surgeon: Donnie Mesa, MD;  Location: [facility];  Service: General;  Laterality: Left;  Marland Kitchen VIDEO BRONCHOSCOPY WITH ENDOBRONCHIAL ULTRASOUND N/A 12/11/2017   Procedure: VIDEO BRONCHOSCOPY WITH ENDOBRONCHIAL ULTRASOUND;  Surgeon: Marshell Garfinkel, MD;  Location: [facility];  Service: Pulmonary;  Laterality: N/A;     OB History    Gravida      Para      Term      Preterm      AB      Living  2     SAB      TAB      Ectopic      Multiple      Live Births               Home Medications    Prior to Admission medications   Medication Sig Start Date End Date Taking? Authorizing Provider  acetaminophen (TYLENOL) 500 MG tablet Take 500 mg by mouth every 6 (six) hours as needed for mild pain or headache.    [provider]  albuterol (PROVENTIL HFA;VENTOLIN HFA) 108 (90 Base) MCG/ACT inhaler  11/29/17   [provider]  amLODipine (NORVASC) 5 MG tablet Take 1 tablet (5 mg total) by mouth daily. 06/30/14   Tysinger, Camelia Eng, PA-C  aspirin EC 81 MG tablet Take 81 mg by mouth daily.    [provider]  dexamethasone (DECADRON) 4 MG tablet Take 1 tablet (4 mg total) by mouth 2 (two) times daily with a meal. 01/17/18   Kyung Rudd, MD  dextromethorphan-guaiFENesin [facility] DM) 30-600 MG 12hr tablet Take 1 tablet by mouth 2 (two) times daily as needed for cough.    [provider]  ibuprofen (ADVIL,MOTRIN) 200 MG tablet Take 200 mg by mouth every 6 (six) hours as needed for fever or moderate pain.    [provider]  LORazepam (  ATIVAN) 0.5 MG tablet 1 tablet po 30 minutes prior to radiation or MRI, or every 4-6 hours prn anxiety 12/31/17   Hayden Pedro, PA-C  Menthol-Methyl Salicylate (MUSCLE RUB) 10-15 % CREA Apply 1 application topically daily as needed for muscle pain.     [provider]  nicotine (NICODERM CQ - DOSED IN MG/24 HOURS) 14 mg/24hr patch Place 1 patch (14 mg total) onto the skin daily. 12/13/17   Domenic Polite, MD  oxyCODONE (OXY IR/ROXICODONE) 5 MG immediate release tablet Take 1 tablet (5 mg total) by mouth every 4 (four) hours as needed for severe pain. 01/07/18   Hayden Pedro, PA-C  prochlorperazine (COMPAZINE) 10 MG tablet Take 1 tablet (10 mg total) by mouth every 6 (six) hours as needed for nausea or vomiting. 01/15/18    Maryanna Shape, NP  tiZANidine (ZANAFLEX) 4 MG tablet  10/29/17   [provider]    Family History Family History  Problem Relation Age of Onset  . Breast cancer Mother   . Heart disease Father   . Lung cancer Brother   . Colon cancer Brother     Social History Social History   Tobacco Use  . Smoking status: Former Smoker    Packs/day: 0.50    Years: 15.00    Pack years: 7.50    Last attempt to quit: 12/10/2017    Years since quitting: 0.1  . Smokeless tobacco: Never Used  . Tobacco comment: quit smoking 12/10/17--12/18/17  Substance Use Topics  . Alcohol use: No  . Drug use: No     Allergies   Patient has no known allergies.   Review of Systems Review of Systems  All other systems reviewed and are negative.    Physical Exam Updated Vital Signs BP 107/87 (BP Location: Left Arm)   Pulse (!) 101   Temp 98.3 F (36.8 C) (Oral)   Resp 18   Ht 1.6 m (5\' 3" )   Wt 59 kg (130 lb)   SpO2 99%   BMI 23.03 kg/m   Physical Exam  Constitutional: She is oriented to person, place, and time. She appears well-developed and well-nourished.  Non-toxic appearance. No distress.  HENT:  Head: Normocephalic and atraumatic.  Eyes: Pupils are equal, round, and reactive to light. Conjunctivae, EOM and lids are normal.  Neck: Normal range of motion. Neck supple. No tracheal deviation present. No thyroid mass present.  Cardiovascular: Normal rate, regular rhythm and normal heart sounds. Exam reveals no gallop.  No murmur heard. Pulmonary/Chest: Effort normal and breath sounds normal. No stridor. No respiratory distress. She has no decreased breath sounds. She has no wheezes. She has no rhonchi. She has no rales.  Abdominal: Soft. Normal appearance and bowel sounds are normal. She exhibits no distension. There is no tenderness. There is no rebound and no CVA tenderness.  Musculoskeletal: Normal range of motion. She exhibits no edema or tenderness.  Neurological: She is  alert and oriented to person, place, and time. She has normal strength. No cranial nerve deficit or sensory deficit. GCS eye subscore is 4. GCS verbal subscore is 5. GCS motor subscore is 6.  Skin: Skin is warm and dry. No abrasion and no rash noted.  Psychiatric: She has a normal mood and affect. Her speech is normal and behavior is normal.  Nursing note and vitals reviewed.    ED Treatments / Results  Labs (all labs ordered are listed, but only abnormal results are displayed) Labs Reviewed  CBC  WITH DIFFERENTIAL/PLATELET  COMPREHENSIVE METABOLIC PANEL    EKG None  Radiology No results found.  Procedures Procedures (including critical care time)  Medications Ordered in ED Medications - No data to display   Initial Impression / Assessment and Plan / ED Course  I have reviewed the triage vital signs and the nursing notes.  Pertinent labs & imaging results that were available during my care of the patient were reviewed by me and considered in my medical decision making (see chart for details).     Patient has no symptoms concerning for possible focal stroke or CVA.  Was concerned about possible calcium dysfunction due to her spasms.  Calcium slightly decreased to 8.6.  Patient had no symptoms here.  Return precautions given  Final Clinical Impressions(s) / ED Diagnoses   Final diagnoses:  None    ED Discharge Orders    None       Lacretia Leigh, MD 01/21/18 1950

## 2018-01-29 ENCOUNTER — Inpatient Hospital Stay: Payer: Medicare HMO

## 2018-01-29 ENCOUNTER — Inpatient Hospital Stay: Payer: Medicare HMO | Attending: Internal Medicine

## 2018-01-29 ENCOUNTER — Encounter: Payer: Self-pay | Admitting: General Practice

## 2018-01-29 ENCOUNTER — Inpatient Hospital Stay (HOSPITAL_BASED_OUTPATIENT_CLINIC_OR_DEPARTMENT_OTHER): Payer: Medicare HMO | Admitting: Oncology

## 2018-01-29 ENCOUNTER — Telehealth: Payer: Self-pay | Admitting: Internal Medicine

## 2018-01-29 ENCOUNTER — Inpatient Hospital Stay: Payer: Medicare HMO | Admitting: Nutrition

## 2018-01-29 VITALS — BP 94/77 | HR 105 | Temp 98.8°F | Resp 18 | Ht 63.0 in | Wt 123.4 lb

## 2018-01-29 VITALS — BP 112/69 | HR 92 | Temp 99.0°F | Resp 16

## 2018-01-29 DIAGNOSIS — C3491 Malignant neoplasm of unspecified part of right bronchus or lung: Secondary | ICD-10-CM

## 2018-01-29 DIAGNOSIS — Z5111 Encounter for antineoplastic chemotherapy: Secondary | ICD-10-CM | POA: Diagnosis not present

## 2018-01-29 DIAGNOSIS — R4589 Other symptoms and signs involving emotional state: Secondary | ICD-10-CM

## 2018-01-29 DIAGNOSIS — I10 Essential (primary) hypertension: Secondary | ICD-10-CM

## 2018-01-29 DIAGNOSIS — Z5112 Encounter for antineoplastic immunotherapy: Secondary | ICD-10-CM

## 2018-01-29 DIAGNOSIS — R634 Abnormal weight loss: Secondary | ICD-10-CM

## 2018-01-29 DIAGNOSIS — Z79899 Other long term (current) drug therapy: Secondary | ICD-10-CM

## 2018-01-29 DIAGNOSIS — R630 Anorexia: Secondary | ICD-10-CM | POA: Diagnosis not present

## 2018-01-29 DIAGNOSIS — Z792 Long term (current) use of antibiotics: Secondary | ICD-10-CM | POA: Insufficient documentation

## 2018-01-29 DIAGNOSIS — C3401 Malignant neoplasm of right main bronchus: Secondary | ICD-10-CM

## 2018-01-29 DIAGNOSIS — B379 Candidiasis, unspecified: Secondary | ICD-10-CM | POA: Insufficient documentation

## 2018-01-29 DIAGNOSIS — R5383 Other fatigue: Secondary | ICD-10-CM | POA: Diagnosis not present

## 2018-01-29 DIAGNOSIS — R42 Dizziness and giddiness: Secondary | ICD-10-CM | POA: Insufficient documentation

## 2018-01-29 DIAGNOSIS — E46 Unspecified protein-calorie malnutrition: Secondary | ICD-10-CM | POA: Insufficient documentation

## 2018-01-29 DIAGNOSIS — Z923 Personal history of irradiation: Secondary | ICD-10-CM | POA: Insufficient documentation

## 2018-01-29 DIAGNOSIS — F329 Major depressive disorder, single episode, unspecified: Secondary | ICD-10-CM | POA: Insufficient documentation

## 2018-01-29 DIAGNOSIS — J9819 Other pulmonary collapse: Secondary | ICD-10-CM | POA: Diagnosis not present

## 2018-01-29 DIAGNOSIS — B373 Candidiasis of vulva and vagina: Secondary | ICD-10-CM | POA: Insufficient documentation

## 2018-01-29 DIAGNOSIS — J069 Acute upper respiratory infection, unspecified: Secondary | ICD-10-CM | POA: Diagnosis not present

## 2018-01-29 DIAGNOSIS — F17210 Nicotine dependence, cigarettes, uncomplicated: Secondary | ICD-10-CM

## 2018-01-29 DIAGNOSIS — Z7982 Long term (current) use of aspirin: Secondary | ICD-10-CM | POA: Insufficient documentation

## 2018-01-29 LAB — CBC WITH DIFFERENTIAL (CANCER CENTER ONLY)
BASOS PCT: 0 %
Basophils Absolute: 0 10*3/uL (ref 0.0–0.1)
EOS ABS: 0 10*3/uL (ref 0.0–0.5)
EOS PCT: 0 %
HCT: 39 % (ref 34.8–46.6)
HEMOGLOBIN: 13.3 g/dL (ref 11.6–15.9)
Lymphocytes Relative: 6 %
Lymphs Abs: 0.4 10*3/uL — ABNORMAL LOW (ref 0.9–3.3)
MCH: 31.6 pg (ref 25.1–34.0)
MCHC: 34 g/dL (ref 31.5–36.0)
MCV: 92.9 fL (ref 79.5–101.0)
Monocytes Absolute: 0.1 10*3/uL (ref 0.1–0.9)
Monocytes Relative: 2 %
NEUTROS PCT: 92 %
Neutro Abs: 6.1 10*3/uL (ref 1.5–6.5)
PLATELETS: 275 10*3/uL (ref 145–400)
RBC: 4.2 MIL/uL (ref 3.70–5.45)
RDW: 14.3 % (ref 11.2–14.5)
WBC: 6.7 10*3/uL (ref 3.9–10.3)

## 2018-01-29 LAB — CMP (CANCER CENTER ONLY)
ALK PHOS: 61 U/L (ref 40–150)
ALT: 18 U/L (ref 0–55)
AST: 10 U/L (ref 5–34)
Albumin: 3 g/dL — ABNORMAL LOW (ref 3.5–5.0)
Anion gap: 11 (ref 3–11)
BILIRUBIN TOTAL: 0.3 mg/dL (ref 0.2–1.2)
BUN: 21 mg/dL (ref 7–26)
CALCIUM: 9.5 mg/dL (ref 8.4–10.4)
CO2: 22 mmol/L (ref 22–29)
CREATININE: 0.69 mg/dL (ref 0.60–1.10)
Chloride: 102 mmol/L (ref 98–109)
Glucose, Bld: 164 mg/dL — ABNORMAL HIGH (ref 70–140)
Potassium: 3.9 mmol/L (ref 3.5–5.1)
Sodium: 135 mmol/L — ABNORMAL LOW (ref 136–145)
TOTAL PROTEIN: 6.9 g/dL (ref 6.4–8.3)

## 2018-01-29 LAB — TSH: TSH: 0.256 u[IU]/mL — AB (ref 0.308–3.960)

## 2018-01-29 MED ORDER — DIPHENHYDRAMINE HCL 50 MG/ML IJ SOLN
50.0000 mg | Freq: Once | INTRAMUSCULAR | Status: AC
  Administered 2018-01-29: 50 mg via INTRAVENOUS

## 2018-01-29 MED ORDER — SODIUM CHLORIDE 0.9 % IV SOLN
175.0000 mg/m2 | Freq: Once | INTRAVENOUS | Status: AC
  Administered 2018-01-29: 282 mg via INTRAVENOUS
  Filled 2018-01-29: qty 47

## 2018-01-29 MED ORDER — FAMOTIDINE IN NACL 20-0.9 MG/50ML-% IV SOLN
20.0000 mg | Freq: Once | INTRAVENOUS | Status: AC
  Administered 2018-01-29: 20 mg via INTRAVENOUS

## 2018-01-29 MED ORDER — DIPHENHYDRAMINE HCL 50 MG/ML IJ SOLN
INTRAMUSCULAR | Status: AC
  Filled 2018-01-29: qty 1

## 2018-01-29 MED ORDER — PALONOSETRON HCL INJECTION 0.25 MG/5ML
INTRAVENOUS | Status: AC
  Filled 2018-01-29: qty 5

## 2018-01-29 MED ORDER — SODIUM CHLORIDE 0.9 % IV SOLN
Freq: Once | INTRAVENOUS | Status: AC
  Administered 2018-01-29: 11:00:00 via INTRAVENOUS

## 2018-01-29 MED ORDER — PALONOSETRON HCL INJECTION 0.25 MG/5ML
0.2500 mg | Freq: Once | INTRAVENOUS | Status: AC
  Administered 2018-01-29: 0.25 mg via INTRAVENOUS

## 2018-01-29 MED ORDER — CARBOPLATIN CHEMO INJECTION 450 MG/45ML
382.0000 mg | Freq: Once | INTRAVENOUS | Status: AC
  Administered 2018-01-29: 380 mg via INTRAVENOUS
  Filled 2018-01-29: qty 38

## 2018-01-29 MED ORDER — FAMOTIDINE IN NACL 20-0.9 MG/50ML-% IV SOLN
INTRAVENOUS | Status: AC
  Filled 2018-01-29: qty 50

## 2018-01-29 MED ORDER — SODIUM CHLORIDE 0.9 % IV SOLN
200.0000 mg | Freq: Once | INTRAVENOUS | Status: AC
  Administered 2018-01-29: 200 mg via INTRAVENOUS
  Filled 2018-01-29: qty 8

## 2018-01-29 MED ORDER — SODIUM CHLORIDE 0.9 % IV SOLN
Freq: Once | INTRAVENOUS | Status: AC
  Administered 2018-01-29: 12:00:00 via INTRAVENOUS
  Filled 2018-01-29: qty 5

## 2018-01-29 MED ORDER — FLUCONAZOLE 100 MG PO TABS: 100.0000 mg | ORAL_TABLET | Freq: Every day | ORAL | 0 refills | Status: DC

## 2018-01-29 NOTE — Assessment & Plan Note (Addendum)
This is a very pleasant 67 year old African-American female recently diagnosed withstage IVnon-small cell lung cancer, squamous cell carcinoma presented with large right hilar mass with encasement of the right pulmonary artery as well as superior vena cava and right upper lobe collapse in addition to precarinal lymphadenopathy diagnosed in February 2019. She recently completed a course of radiation to her right lung mass and to have SRS to the brain. The patient is now here to begin carboplatin for an AUC of 5, paclitaxel 175 mg/m, and Keytruda 200 mg IV given every 3 weeks x4 cycles and then beginning with cycle 5 she would continue on Keytruda as a single agent.    I have again reviewed with the patient the adverse effects of this treatment including but not limited to alopecia, myelosuppression, nausea and vomiting, peripheral neuropathy, liver or renal dysfunctionin addition to the adverse effectsof  immune therapy including but not limited to immune mediated the skin rash, diarrhea, inflammation of the lung, kidney, liver, thyroid or other endocrine dysfunction. The patient would like to proceed. She has already attended a chemotherapy education class.  She has Compazine available to her at home. She remains on dexamethasone 4 mg twice daily.  Discussed with Dr. Julien Nordmann, the patient was instructed to reduce this to 2 mg twice a day for 1 week and then 2 mg daily until the medication is complete.  Her blood pressure is slightly low today.  She has not taken her amlodipine yet.  I have advised her to discontinue her amlodipine at this time.  We will continue to watch her blood pressure.  For weight loss, she will see the dietitian today.  The patient symptoms consistent with vaginal candidiasis.  Perception for fluconazole 100 mg daily for 7 days was sent to her pharmacy.  The patient displays symptoms of depression including a flat affect. She met with our social worker today during her visit.   Appointment with behavioral health was confirmed for tomorrow at 1 PM.  Patient is in agreement to going to this appointment.  The patient will have weekly labs.  She will return in 3 weeks for evaluation prior to cycle 2 of her treatment.  She was advised to call immediately if she has any concerning symptoms in the interval. The patient voices understanding of current disease status and treatment options and is in agreement with the current care plan.  All questions were answered. The patient knows to call the clinic with any problems, questions or concerns. We can certainly see the patient much sooner if necessary.

## 2018-01-29 NOTE — Progress Notes (Signed)
[facility] CSW Progress Note  CSW received call from Josetta Huddle NP, concerned about patient symptoms.  CSW confirmed that patient has first appt w psychiatrist, Dr Adele Schilder, tomorrow April 12 at 1 PM at [facility].  Patient and sister concerned that they are registered for horticulture therapy class at [facility] tomorrow 1 - 2:30.  CSW explained that next appt w psychiatrist will be in June, NP and CSW urged patient to keep currently scheduled visit.  Sister and patient agreeable, will arrive to complete paperwork 12 - 12:15 PM.  CSW will work w Ecologist to ensure patient can participate in class upon completion of psychiatric visit.  [facility] informed.  CSW also advised that [facility] has been able to communicate w patient via sister who is major support for transportation and medical care, attends all visits w patient.    Edwyna Shell, LCSW Clinical Social Worker Phone:  [phone]

## 2018-01-29 NOTE — Progress Notes (Signed)
67 year old female diagnosed with stage IV non-small cell lung cancer. She is a patient of Dr. Julien Nordmann and Dr. Lisbeth Renshaw.  Past medical history includes tobacco, hypertension, and depression.  Medications include Decadron, Ativan, and Compazine.  Labs include glucose 301 and albumin 3.0 on April 2.  Height: 63 inches. Weight: 123.4 pounds. Usual body weight: 149 pounds in January 2018.  Patient weight 140 pounds in February 2019. BMI: 21.6.  Patient reports she cannot "hold any weight ". She denies nutrition impact symptoms. States she is eating normally. Reports drinking chocolate Ensure occasionally. Nutrition focused physical exam was deferred.  Nutrition diagnosis:  Unintended weight loss related to inadequate oral intake as evidenced by 12% weight loss over 2 months.  Intervention: Educated patient and sister on importance of increasing calories and protein in small frequent meals and snacks. Recommended patient drink Ensure plus or Ensure Enlive twice daily between meals. I provided samples and coupons. Reviewed other ways to increase calories and protein.  I provided fact sheets. Questions were answered.  Teach back method used.  Contact information given.  Monitoring, evaluation, goals: Patient will work to increase calories and protein to minimize further weight loss.  Next visit: Wednesday, May 1 during infusion.  **Disclaimer: This note was dictated with voice recognition software. Similar sounding words can inadvertently be transcribed and this note may contain transcription errors which may not have been corrected upon publication of note.**

## 2018-01-29 NOTE — Telephone Encounter (Signed)
appts already scheduled 3 cycles per 4/10 los.  - no additional appts to add yet.

## 2018-01-29 NOTE — Patient Instructions (Signed)
[facility] Discharge Instructions for Patients Receiving Chemotherapy  Today you received the following chemotherapy agents Keytruda, Taxol, and Carboplatin.   To help prevent nausea and vomiting after your treatment, we encourage you to take your nausea medication as directed.   If you develop nausea and vomiting that is not controlled by your nausea medication, call the clinic.   BELOW ARE SYMPTOMS THAT SHOULD BE REPORTED IMMEDIATELY:  *FEVER GREATER THAN 100.5 F  *CHILLS WITH OR WITHOUT FEVER  NAUSEA AND VOMITING THAT IS NOT CONTROLLED WITH YOUR NAUSEA MEDICATION  *UNUSUAL SHORTNESS OF BREATH  *UNUSUAL BRUISING OR BLEEDING  TENDERNESS IN MOUTH AND THROAT WITH OR WITHOUT PRESENCE OF ULCERS  *URINARY PROBLEMS  *BOWEL PROBLEMS  UNUSUAL RASH Items with * indicate a potential emergency and should be followed up as soon as possible.  Feel free to call the clinic should you have any questions or concerns. The clinic phone number is [phone].  Please show the [facility] at check-in to the Emergency Department and triage nurse.   Pembrolizumab injection What is this medicine? PEMBROLIZUMAB (pem broe liz ue mab) is a monoclonal antibody. It is used to treat melanoma, head and neck cancer, Hodgkin lymphoma, non-small cell lung cancer, urothelial cancer, stomach cancer, and cancers that have a certain genetic condition. This medicine may be used for other purposes; ask your health care provider or pharmacist if you have questions. COMMON BRAND NAME(S): Keytruda What should I tell my health care provider before I take this medicine? They need to know if you have any of these conditions: -diabetes -immune system problems -inflammatory bowel disease -liver disease -lung or breathing disease -lupus -organ transplant -an unusual or allergic reaction to pembrolizumab, other medicines, foods, dyes, or preservatives -pregnant or trying to get  pregnant -breast-feeding How should I use this medicine? This medicine is for infusion into a vein. It is given by a health care professional in a hospital or clinic setting. A special MedGuide will be given to you before each treatment. Be sure to read this information carefully each time. Talk to your pediatrician regarding the use of this medicine in children. While this drug may be prescribed for selected conditions, precautions do apply. Overdosage: If you think you have taken too much of this medicine contact a poison control center or emergency room at once. NOTE: This medicine is only for you. Do not share this medicine with others. What if I miss a dose? It is important not to miss your dose. Call your doctor or health care professional if you are unable to keep an appointment. What may interact with this medicine? Interactions have not been studied. Give your health care provider a list of all the medicines, herbs, non-prescription drugs, or dietary supplements you use. Also tell them if you smoke, drink alcohol, or use illegal drugs. Some items may interact with your medicine. This list may not describe all possible interactions. Give your health care provider a list of all the medicines, herbs, non-prescription drugs, or dietary supplements you use. Also tell them if you smoke, drink alcohol, or use illegal drugs. Some items may interact with your medicine. What should I watch for while using this medicine? Your condition will be monitored carefully while you are receiving this medicine. You may need blood work done while you are taking this medicine. Do not become pregnant while taking this medicine or for 4 months after stopping it. Women should inform their doctor if they wish to become  pregnant or think they might be pregnant. There is a potential for serious side effects to an unborn child. Talk to your health care professional or pharmacist for more information. Do not breast-feed  an infant while taking this medicine or for 4 months after the last dose. What side effects may I notice from receiving this medicine? Side effects that you should report to your doctor or health care professional as soon as possible: -allergic reactions like skin rash, itching or hives, swelling of the face, lips, or tongue -bloody or black, tarry -breathing problems -changes in vision -chest pain -chills -constipation -cough -dizziness or feeling faint or lightheaded -fast or irregular heartbeat -fever -flushing -hair loss -low blood counts - this medicine may decrease the number of white blood cells, red blood cells and platelets. You may be at increased risk for infections and bleeding. -muscle pain -muscle weakness -persistent headache -signs and symptoms of high blood sugar such as dizziness; dry mouth; dry skin; fruity breath; nausea; stomach pain; increased hunger or thirst; increased urination -signs and symptoms of kidney injury like trouble passing urine or change in the amount of urine -signs and symptoms of liver injury like dark urine, light-colored stools, loss of appetite, nausea, right upper belly pain, yellowing of the eyes or skin -stomach pain -sweating -weight loss Side effects that usually do not require medical attention (report to your doctor or health care professional if they continue or are bothersome): -decreased appetite -diarrhea -tiredness This list may not describe all possible side effects. Call your doctor for medical advice about side effects. You may report side effects to FDA at 1-800-FDA-1088. Where should I keep my medicine? This drug is given in a hospital or clinic and will not be stored at home. NOTE: This sheet is a summary. It may not cover all possible information. If you have questions about this medicine, talk to your doctor, pharmacist, or health care provider.  2018 Elsevier/Gold Standard (2016-07-17 12:29:36)   Paclitaxel  injection What is this medicine? PACLITAXEL (PAK li TAX el) is a chemotherapy drug. It targets fast dividing cells, like cancer cells, and causes these cells to die. This medicine is used to treat ovarian cancer, breast cancer, and other cancers. This medicine may be used for other purposes; ask your health care provider or pharmacist if you have questions. COMMON BRAND NAME(S): Onxol, Taxol What should I tell my health care provider before I take this medicine? They need to know if you have any of these conditions: -blood disorders -irregular heartbeat -infection (especially a virus infection such as chickenpox, cold sores, or herpes) -liver disease -previous or ongoing radiation therapy -an unusual or allergic reaction to paclitaxel, alcohol, polyoxyethylated castor oil, other chemotherapy agents, other medicines, foods, dyes, or preservatives -pregnant or trying to get pregnant -breast-feeding How should I use this medicine? This drug is given as an infusion into a vein. It is administered in a hospital or clinic by a specially trained health care professional. Talk to your pediatrician regarding the use of this medicine in children. Special care may be needed. Overdosage: If you think you have taken too much of this medicine contact a poison control center or emergency room at once. NOTE: This medicine is only for you. Do not share this medicine with others. What if I miss a dose? It is important not to miss your dose. Call your doctor or health care professional if you are unable to keep an appointment. What may interact with this medicine? Do not  take this medicine with any of the following medications: -disulfiram -metronidazole This medicine may also interact with the following medications: -cyclosporine -diazepam -ketoconazole -medicines to increase blood counts like filgrastim, pegfilgrastim, sargramostim -other chemotherapy drugs like cisplatin, doxorubicin, epirubicin,  etoposide, teniposide, vincristine -quinidine -testosterone -vaccines -verapamil Talk to your doctor or health care professional before taking any of these medicines: -acetaminophen -aspirin -ibuprofen -ketoprofen -naproxen This list may not describe all possible interactions. Give your health care provider a list of all the medicines, herbs, non-prescription drugs, or dietary supplements you use. Also tell them if you smoke, drink alcohol, or use illegal drugs. Some items may interact with your medicine. What should I watch for while using this medicine? Your condition will be monitored carefully while you are receiving this medicine. You will need important blood work done while you are taking this medicine. This medicine can cause serious allergic reactions. To reduce your risk you will need to take other medicine(s) before treatment with this medicine. If you experience allergic reactions like skin rash, itching or hives, swelling of the face, lips, or tongue, tell your doctor or health care professional right away. In some cases, you may be given additional medicines to help with side effects. Follow all directions for their use. This drug may make you feel generally unwell. This is not uncommon, as chemotherapy can affect healthy cells as well as cancer cells. Report any side effects. Continue your course of treatment even though you feel ill unless your doctor tells you to stop. Call your doctor or health care professional for advice if you get a fever, chills or sore throat, or other symptoms of a cold or flu. Do not treat yourself. This drug decreases your body's ability to fight infections. Try to avoid being around people who are sick. This medicine may increase your risk to bruise or bleed. Call your doctor or health care professional if you notice any unusual bleeding. Be careful brushing and flossing your teeth or using a toothpick because you may get an infection or bleed more  easily. If you have any dental work done, tell your dentist you are receiving this medicine. Avoid taking products that contain aspirin, acetaminophen, ibuprofen, naproxen, or ketoprofen unless instructed by your doctor. These medicines may hide a fever. Do not become pregnant while taking this medicine. Women should inform their doctor if they wish to become pregnant or think they might be pregnant. There is a potential for serious side effects to an unborn child. Talk to your health care professional or pharmacist for more information. Do not breast-feed an infant while taking this medicine. Men are advised not to father a child while receiving this medicine. This product may contain alcohol. Ask your pharmacist or healthcare provider if this medicine contains alcohol. Be sure to tell all healthcare providers you are taking this medicine. Certain medicines, like metronidazole and disulfiram, can cause an unpleasant reaction when taken with alcohol. The reaction includes flushing, headache, nausea, vomiting, sweating, and increased thirst. The reaction can last from 30 minutes to several hours. What side effects may I notice from receiving this medicine? Side effects that you should report to your doctor or health care professional as soon as possible: -allergic reactions like skin rash, itching or hives, swelling of the face, lips, or tongue -low blood counts - This drug may decrease the number of white blood cells, red blood cells and platelets. You may be at increased risk for infections and bleeding. -signs of infection - fever or  chills, cough, sore throat, pain or difficulty passing urine -signs of decreased platelets or bleeding - bruising, pinpoint red spots on the skin, black, tarry stools, nosebleeds -signs of decreased red blood cells - unusually weak or tired, fainting spells, lightheadedness -breathing problems -chest pain -high or low blood pressure -mouth sores -nausea and  vomiting -pain, swelling, redness or irritation at the injection site -pain, tingling, numbness in the hands or feet -slow or irregular heartbeat -swelling of the ankle, feet, hands Side effects that usually do not require medical attention (report to your doctor or health care professional if they continue or are bothersome): -bone pain -complete hair loss including hair on your head, underarms, pubic hair, eyebrows, and eyelashes -changes in the color of fingernails -diarrhea -loosening of the fingernails -loss of appetite -muscle or joint pain -red flush to skin -sweating This list may not describe all possible side effects. Call your doctor for medical advice about side effects. You may report side effects to FDA at 1-800-FDA-1088. Where should I keep my medicine? This drug is given in a hospital or clinic and will not be stored at home. NOTE: This sheet is a summary. It may not cover all possible information. If you have questions about this medicine, talk to your doctor, pharmacist, or health care provider.  2018 Elsevier/Gold Standard (2015-08-09 19:58:00)  Carboplatin injection What is this medicine? CARBOPLATIN (KAR boe pla tin) is a chemotherapy drug. It targets fast dividing cells, like cancer cells, and causes these cells to die. This medicine is used to treat ovarian cancer and many other cancers. This medicine may be used for other purposes; ask your health care provider or pharmacist if you have questions. COMMON BRAND NAME(S): Paraplatin What should I tell my health care provider before I take this medicine? They need to know if you have any of these conditions: -blood disorders -hearing problems -kidney disease -recent or ongoing radiation therapy -an unusual or allergic reaction to carboplatin, cisplatin, other chemotherapy, other medicines, foods, dyes, or preservatives -pregnant or trying to get pregnant -breast-feeding How should I use this medicine? This  drug is usually given as an infusion into a vein. It is administered in a hospital or clinic by a specially trained health care professional. Talk to your pediatrician regarding the use of this medicine in children. Special care may be needed. Overdosage: If you think you have taken too much of this medicine contact a poison control center or emergency room at once. NOTE: This medicine is only for you. Do not share this medicine with others. What if I miss a dose? It is important not to miss a dose. Call your doctor or health care professional if you are unable to keep an appointment. What may interact with this medicine? -medicines for seizures -medicines to increase blood counts like filgrastim, pegfilgrastim, sargramostim -some antibiotics like amikacin, gentamicin, neomycin, streptomycin, tobramycin -vaccines Talk to your doctor or health care professional before taking any of these medicines: -acetaminophen -aspirin -ibuprofen -ketoprofen -naproxen This list may not describe all possible interactions. Give your health care provider a list of all the medicines, herbs, non-prescription drugs, or dietary supplements you use. Also tell them if you smoke, drink alcohol, or use illegal drugs. Some items may interact with your medicine. What should I watch for while using this medicine? Your condition will be monitored carefully while you are receiving this medicine. You will need important blood work done while you are taking this medicine. This drug may make you feel generally  unwell. This is not uncommon, as chemotherapy can affect healthy cells as well as cancer cells. Report any side effects. Continue your course of treatment even though you feel ill unless your doctor tells you to stop. In some cases, you may be given additional medicines to help with side effects. Follow all directions for their use. Call your doctor or health care professional for advice if you get a fever, chills or sore  throat, or other symptoms of a cold or flu. Do not treat yourself. This drug decreases your body's ability to fight infections. Try to avoid being around people who are sick. This medicine may increase your risk to bruise or bleed. Call your doctor or health care professional if you notice any unusual bleeding. Be careful brushing and flossing your teeth or using a toothpick because you may get an infection or bleed more easily. If you have any dental work done, tell your dentist you are receiving this medicine. Avoid taking products that contain aspirin, acetaminophen, ibuprofen, naproxen, or ketoprofen unless instructed by your doctor. These medicines may hide a fever. Do not become pregnant while taking this medicine. Women should inform their doctor if they wish to become pregnant or think they might be pregnant. There is a potential for serious side effects to an unborn child. Talk to your health care professional or pharmacist for more information. Do not breast-feed an infant while taking this medicine. What side effects may I notice from receiving this medicine? Side effects that you should report to your doctor or health care professional as soon as possible: -allergic reactions like skin rash, itching or hives, swelling of the face, lips, or tongue -signs of infection - fever or chills, cough, sore throat, pain or difficulty passing urine -signs of decreased platelets or bleeding - bruising, pinpoint red spots on the skin, black, tarry stools, nosebleeds -signs of decreased red blood cells - unusually weak or tired, fainting spells, lightheadedness -breathing problems -changes in hearing -changes in vision -chest pain -high blood pressure -low blood counts - This drug may decrease the number of white blood cells, red blood cells and platelets. You may be at increased risk for infections and bleeding. -nausea and vomiting -pain, swelling, redness or irritation at the injection site -pain,  tingling, numbness in the hands or feet -problems with balance, talking, walking -trouble passing urine or change in the amount of urine Side effects that usually do not require medical attention (report to your doctor or health care professional if they continue or are bothersome): -hair loss -loss of appetite -metallic taste in the mouth or changes in taste This list may not describe all possible side effects. Call your doctor for medical advice about side effects. You may report side effects to FDA at 1-800-FDA-1088. Where should I keep my medicine? This drug is given in a hospital or clinic and will not be stored at home. NOTE: This sheet is a summary. It may not cover all possible information. If you have questions about this medicine, talk to your doctor, pharmacist, or health care provider.  2018 Elsevier/Gold Standard (2008-01-13 14:38:05)

## 2018-01-29 NOTE — Patient Instructions (Signed)
Stop amlodipine.  Reduce Dexamethasone to 1/2 tablet twice a day for 1 week and then take 1/2 daily until medication is complete.

## 2018-01-29 NOTE — Progress Notes (Signed)
[facility] OFFICE PROGRESS NOTE  Beth Ebbs, MD [place] [address]  DIAGNOSIS: Stage IVnon-small cell lung cancer, squamous cell carcinoma presented with large right hilar mass with encasement of the right pulmonary artery as well as superior vena cava and right upper lobe collapse in addition to precarinal lymphadenopathy diagnosed in February 2019.  PRIOR THERAPY: SRS to the brain lesion and palliative radiation to therightupper lobe of the lung lesion.  The patient completed treatment on 01/17/2018.  CURRENT THERAPY: palliative systemic chemotherapy with carboplatin for an AUC of 5, paclitaxel 175 mg/m, and Keytruda 200 mg IV given every 3 weeks.  First dose 01/29/2018.  INTERVAL HISTORY: Beth Alexander 67 y.o. female returns for routine follow-up visit accompanied by her sister.  The patient is feeling fine and has no specific complaints except for vaginal soreness and itching.  The patient denies fevers and chills.  Denies chest pain, shortness breath, cough, hemoptysis.  Denies nausea, vomiting, constipation, diarrhea.  The patient has a decreased appetite and has lost more weight.  She completed her radiation to her right upper lobe lung mass and had SRS to the brain on 01/17/2018.  The patient has attended a chemotherapy education class.  She is here today for evaluation prior to cycle 1 of her treatment.  MEDICAL HISTORY: Past Medical History:  Diagnosis Date  . Hypertension   . Tobacco use     ALLERGIES:  has No Known Allergies.  MEDICATIONS:  Current Outpatient Medications  Medication Sig Dispense Refill  . acetaminophen (TYLENOL) 500 MG tablet Take 500 mg by mouth every 6 (six) hours as needed for mild pain or headache.    Marland Kitchen amLODipine (NORVASC) 5 MG tablet Take 1 tablet (5 mg total) by mouth daily. 30 tablet 5  . aspirin EC 81 MG tablet Take 81 mg by mouth daily.    Marland Kitchen dexamethasone (DECADRON) 4 MG tablet Take 1 tablet (4 mg total) by  mouth 2 (two) times daily with a meal. 28 tablet 0  . fluconazole (DIFLUCAN) 100 MG tablet Take 1 tablet (100 mg total) by mouth daily. 7 tablet 0  . LORazepam (ATIVAN) 0.5 MG tablet 1 tablet po 30 minutes prior to radiation or MRI, or every 4-6 hours prn anxiety 60 tablet 0  . nicotine (NICODERM CQ - DOSED IN MG/24 HOURS) 14 mg/24hr patch Place 1 patch (14 mg total) onto the skin daily. 28 patch 0  . oxyCODONE (OXY IR/ROXICODONE) 5 MG immediate release tablet Take 1 tablet (5 mg total) by mouth every 4 (four) hours as needed for severe pain. 60 tablet 0  . prochlorperazine (COMPAZINE) 10 MG tablet Take 1 tablet (10 mg total) by mouth every 6 (six) hours as needed for nausea or vomiting. 30 tablet 1   No current facility-administered medications for this visit.    Facility-Administered Medications Ordered in Other Visits  Medication Dose Route Frequency Provider Last Rate Last Dose  . CARBOplatin (PARAPLATIN) 380 mg in sodium chloride 0.9 % 250 mL chemo infusion  380 mg Intravenous Once Curt Bears, MD      . diphenhydrAMINE (BENADRYL) injection 50 mg  50 mg Intravenous Once Curt Bears, MD      . famotidine (PEPCID) IVPB 20 mg premix  20 mg Intravenous Once Curt Bears, MD      . fosaprepitant (EMEND) 150 mg, dexamethasone (DECADRON) 12 mg in sodium chloride 0.9 % 145 mL IVPB   Intravenous Once Curt Bears, MD      .  PACLitaxel (TAXOL) 282 mg in sodium chloride 0.9 % 250 mL chemo infusion (> 89m/m2)  175 mg/m2 (Treatment Plan Recorded) Intravenous Once MCurt Bears MD      . pembrolizumab ([facility] 200 mg in sodium chloride 0.9 % 50 mL chemo infusion  200 mg Intravenous Once MCurt Bears MD        SURGICAL HISTORY:  Past Surgical History:  Procedure Laterality Date  . BREAST LUMPECTOMY WITH RADIOACTIVE SEED LOCALIZATION Left 11/08/2016   Procedure: LEFT BREAST LUMPECTOMY WITH RADIOACTIVE SEED LOCALIZATION;  Surgeon: MDonnie Mesa MD;  [place] [facility]  Service: General;  Laterality: Left;  .Marland KitchenVIDEO BRONCHOSCOPY WITH ENDOBRONCHIAL ULTRASOUND N/A 12/11/2017   Procedure: VIDEO BRONCHOSCOPY WITH ENDOBRONCHIAL ULTRASOUND;  Surgeon: MMarshell Garfinkel MD;  [facility]  Service: Pulmonary;  Laterality: N/A;    REVIEW OF SYSTEMS:   Review of Systems  Constitutional: Negative for chills, fatigue, fever.  Positive for decreased appetite and weight loss. HENT:   Negative for mouth sores, nosebleeds, sore throat and trouble swallowing.   Eyes: Negative for eye problems and icterus.  Respiratory: Negative for cough, hemoptysis, shortness of breath and wheezing.   Cardiovascular: Negative for chest pain and leg swelling.  Gastrointestinal: Negative for abdominal pain, constipation, diarrhea, nausea and vomiting.  Genitourinary: Negative for bladder incontinence, difficulty urinating, dysuria, frequency and hematuria.  Positive for pain and itching to her vaginal area.  Musculoskeletal: Negative for back pain, gait problem, neck pain and neck stiffness.  Skin: Negative for rash.  Neurological: Negative for dizziness, extremity weakness, gait problem, headaches, light-headedness and seizures.  Hematological: Negative for adenopathy. Does not bruise/bleed easily.  Psychiatric/Behavioral: Negative for confusion, sleep disturbance. The patient is not nervous/anxious.  Positive for depression.   PHYSICAL EXAMINATION:  Blood pressure 94/77, pulse (!) 105, temperature 98.8 F (37.1 C), temperature source Oral, resp. rate 18, height 5' 3"  (1.6 m), weight 123 lb 6.4 oz (56 kg), SpO2 100 %.  ECOG PERFORMANCE STATUS: 1 - Symptomatic but completely ambulatory  Physical Exam  Constitutional: Oriented to person, place, and time and well-developed, well-nourished, and in no distress. No distress.  HENT:  Head: Normocephalic and atraumatic.  Mouth/Throat: Oropharynx is clear and moist. No oropharyngeal exudate.  Eyes: Conjunctivae are normal.  Right eye exhibits no discharge. Left eye exhibits no discharge. No scleral icterus.  Neck: Normal range of motion. Neck supple.  Cardiovascular: Normal rate, regular rhythm, normal heart sounds and intact distal pulses.   Pulmonary/Chest: Effort normal and breath sounds normal. No respiratory distress. No wheezes. No rales.  Abdominal: Soft. Bowel sounds are normal. Exhibits no distension and no mass. There is no tenderness.  Musculoskeletal: Normal range of motion. Exhibits no edema.  Lymphadenopathy:    No cervical adenopathy.  Neurological: Alert and oriented to person, place, and time. Exhibits normal muscle tone. Gait normal. Coordination normal.  Skin: Skin is warm and dry. No rash noted. Not diaphoretic. No erythema. No pallor.  Psychiatric: Mood, memory and judgment normal. Flat affect. Vitals reviewed.  LABORATORY DATA: Lab Results  Component Value Date   WBC 6.7 01/29/2018   HGB 12.6 01/21/2018   HCT 39.0 01/29/2018   MCV 92.9 01/29/2018   PLT 275 01/29/2018      Chemistry      Component Value Date/Time   NA 135 (L) 01/29/2018 0947   K 3.9 01/29/2018 0947   CL 102 01/29/2018 0947   CO2 22 01/29/2018 0947   BUN 21 01/29/2018 0947   CREATININE 0.69  01/29/2018 0947      Component Value Date/Time   CALCIUM 9.5 01/29/2018 0947   ALKPHOS 61 01/29/2018 0947   AST 10 01/29/2018 0947   ALT 18 01/29/2018 0947   BILITOT 0.3 01/29/2018 0947       RADIOGRAPHIC STUDIES:  Mr Jeri Cos ER Contrast  Result Date: 01/10/2018 CLINICAL DATA:  Metastatic disease stereotactic radiosurgery planning EXAM: MRI HEAD WITHOUT AND WITH CONTRAST TECHNIQUE: Multiplanar, multiecho pulse sequences of the brain and surrounding structures were obtained without and with intravenous contrast. CONTRAST:  83m MULTIHANCE GADOBENATE DIMEGLUMINE 529 MG/ML IV SOLN COMPARISON:  Brain MRI 12/27/2017 FINDINGS: Brain: The midline structures are normal. There is no acute infarct or acute hemorrhage.  Peripherally contrast-enhancing lesion centered in the right thalamus is unchanged in size measuring 2.0 x 1.7 cm. No surrounding edema. Multifocal white matter hyperintensity, most commonly due to chronic ischemic microangiopathy. No age-advanced or lobar predominant atrophy. No chronic microhemorrhage or superficial siderosis. Vascular: Major intracranial arterial and venous sinus flow voids are preserved. Skull and upper cervical spine: The visualized skull base, calvarium, upper cervical spine and extracranial soft tissues are normal. Sinuses/Orbits: No fluid levels or advanced mucosal thickening. No mastoid or middle ear effusion. Normal orbits. IMPRESSION: Unchanged solitary lesion in the right thalamus. Electronically Signed   By: KUlyses JarredM.D.   On: 01/10/2018 22:07     ASSESSMENT/PLAN:  Squamous cell carcinoma of right lung ([facility] This is a very pleasant 67year old African-American female recently diagnosed withstage IVnon-small cell lung cancer, squamous cell carcinoma presented with large right hilar mass with encasement of the right pulmonary artery as well as superior vena cava and right upper lobe collapse in addition to precarinal lymphadenopathy diagnosed in February 2019. She recently completed a course of radiation to her right lung mass and to have SRS to the brain. The patient is now here to begin carboplatin for an AUC of 5, paclitaxel 175 mg/m, and Keytruda 200 mg IV given every 3 weeks x4 cycles and then beginning with cycle 5 she would continue on Keytruda as a single agent.    I have again reviewed with the patient the adverse effects of this treatment including but not limited to alopecia, myelosuppression, nausea and vomiting, peripheral neuropathy, liver or renal dysfunctionin addition to the adverse effectsof  immune therapy including but not limited to immune mediated the skin rash, diarrhea, inflammation of the lung, kidney, liver, thyroid or other endocrine  dysfunction. The patient would like to proceed. She has already attended a chemotherapy education class.  She has Compazine available to her at home. She remains on dexamethasone 4 mg twice daily.  Discussed with Dr. MJulien Nordmann the patient was instructed to reduce this to 2 mg twice a day for 1 week and then 2 mg daily until the medication is complete.  Her blood pressure is slightly low today.  She has not taken her amlodipine yet.  I have advised her to discontinue her amlodipine at this time.  We will continue to watch her blood pressure.  For weight loss, she will see the dietitian today.  The patient symptoms consistent with vaginal candidiasis.  Perception for fluconazole 100 mg daily for 7 days was sent to her pharmacy.  The patient displays symptoms of depression including a flat affect. She met with our social worker today during her visit.  Appointment with behavioral health was confirmed for tomorrow at 1 PM.  Patient is in agreement to going to this appointment.  The  patient will have weekly labs.  She will return in 3 weeks for evaluation prior to cycle 2 of her treatment.  She was advised to call immediately if she has any concerning symptoms in the interval. The patient voices understanding of current disease status and treatment options and is in agreement with the current care plan.  All questions were answered. The patient knows to call the clinic with any problems, questions or concerns. We can certainly see the patient much sooner if necessary.   No orders of the defined types were placed in this encounter.  Mikey Bussing, DNP, AGPCNP-BC, AOCNP 01/29/18

## 2018-01-30 ENCOUNTER — Ambulatory Visit (INDEPENDENT_AMBULATORY_CARE_PROVIDER_SITE_OTHER): Payer: Medicare HMO | Admitting: Psychiatry

## 2018-01-30 ENCOUNTER — Encounter (HOSPITAL_COMMUNITY): Payer: Self-pay | Admitting: Psychiatry

## 2018-01-30 VITALS — BP 126/76 | HR 95 | Ht 63.0 in | Wt 122.0 lb

## 2018-01-30 DIAGNOSIS — F419 Anxiety disorder, unspecified: Secondary | ICD-10-CM

## 2018-01-30 DIAGNOSIS — Z87891 Personal history of nicotine dependence: Secondary | ICD-10-CM

## 2018-01-30 DIAGNOSIS — C7931 Secondary malignant neoplasm of brain: Secondary | ICD-10-CM

## 2018-01-30 DIAGNOSIS — R51 Headache: Secondary | ICD-10-CM

## 2018-01-30 DIAGNOSIS — F331 Major depressive disorder, recurrent, moderate: Secondary | ICD-10-CM

## 2018-01-30 DIAGNOSIS — Z6379 Other stressful life events affecting family and household: Secondary | ICD-10-CM | POA: Diagnosis not present

## 2018-01-30 DIAGNOSIS — C3490 Malignant neoplasm of unspecified part of unspecified bronchus or lung: Secondary | ICD-10-CM

## 2018-01-30 DIAGNOSIS — M549 Dorsalgia, unspecified: Secondary | ICD-10-CM

## 2018-01-30 DIAGNOSIS — R450 Nervousness: Secondary | ICD-10-CM

## 2018-01-30 DIAGNOSIS — G4700 Insomnia, unspecified: Secondary | ICD-10-CM

## 2018-01-30 DIAGNOSIS — F410 Panic disorder [episodic paroxysmal anxiety] without agoraphobia: Secondary | ICD-10-CM

## 2018-01-30 MED ORDER — SERTRALINE HCL 50 MG PO TABS: ORAL_TABLET | ORAL | 0 refills | Status: DC
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
## 2018-01-31 ENCOUNTER — Ambulatory Visit: Payer: Medicare HMO

## 2018-01-31 MED ORDER — PEGFILGRASTIM-CBQV 6 MG/0.6ML ~~LOC~~ SOSY
PREFILLED_SYRINGE | SUBCUTANEOUS | Status: AC
  Filled 2018-01-31: qty 0.6

## 2018-02-05 ENCOUNTER — Inpatient Hospital Stay: Payer: Medicare HMO

## 2018-02-05 DIAGNOSIS — Z5111 Encounter for antineoplastic chemotherapy: Secondary | ICD-10-CM | POA: Diagnosis not present

## 2018-02-05 DIAGNOSIS — C3491 Malignant neoplasm of unspecified part of right bronchus or lung: Secondary | ICD-10-CM

## 2018-02-05 LAB — CBC WITH DIFFERENTIAL (CANCER CENTER ONLY)
BASOS PCT: 0 %
Basophils Absolute: 0 10*3/uL (ref 0.0–0.1)
Eosinophils Absolute: 0 10*3/uL (ref 0.0–0.5)
Eosinophils Relative: 1 %
HEMATOCRIT: 33.8 % — AB (ref 34.8–46.6)
HEMOGLOBIN: 11.5 g/dL — AB (ref 11.6–15.9)
LYMPHS ABS: 0.2 10*3/uL — AB (ref 0.9–3.3)
LYMPHS PCT: 7 %
MCH: 31.1 pg (ref 25.1–34.0)
MCHC: 34.1 g/dL (ref 31.5–36.0)
MCV: 91.1 fL (ref 79.5–101.0)
MONO ABS: 0.1 10*3/uL (ref 0.1–0.9)
MONOS PCT: 5 %
NEUTROS ABS: 2.7 10*3/uL (ref 1.5–6.5)
NEUTROS PCT: 87 %
Platelet Count: 246 10*3/uL (ref 145–400)
RBC: 3.71 MIL/uL (ref 3.70–5.45)
RDW: 14.5 % (ref 11.2–14.5)
WBC Count: 3.1 10*3/uL — ABNORMAL LOW (ref 3.9–10.3)

## 2018-02-05 LAB — CMP (CANCER CENTER ONLY)
ALT: 24 U/L (ref 0–55)
ANION GAP: 6 (ref 3–11)
AST: 13 U/L (ref 5–34)
Albumin: 3 g/dL — ABNORMAL LOW (ref 3.5–5.0)
Alkaline Phosphatase: 61 U/L (ref 40–150)
BUN: 17 mg/dL (ref 7–26)
CHLORIDE: 101 mmol/L (ref 98–109)
CO2: 27 mmol/L (ref 22–29)
Calcium: 9.4 mg/dL (ref 8.4–10.4)
Creatinine: 0.57 mg/dL — ABNORMAL LOW (ref 0.60–1.10)
GFR, Estimated: >60 mL/min (ref >60)
Glucose, Bld: 97 mg/dL (ref 70–140)
POTASSIUM: 4.2 mmol/L (ref 3.5–5.1)
SODIUM: 134 mmol/L — AB (ref 136–145)
Total Bilirubin: 0.3 mg/dL (ref 0.2–1.2)
Total Protein: 6.7 g/dL (ref 6.4–8.3)

## 2018-02-10 ENCOUNTER — Other Ambulatory Visit: Payer: Self-pay | Admitting: Medical Oncology

## 2018-02-10 ENCOUNTER — Telehealth: Payer: Self-pay | Admitting: Medical Oncology

## 2018-02-10 ENCOUNTER — Inpatient Hospital Stay: Payer: Medicare HMO

## 2018-02-10 ENCOUNTER — Inpatient Hospital Stay (HOSPITAL_BASED_OUTPATIENT_CLINIC_OR_DEPARTMENT_OTHER): Payer: Medicare HMO | Admitting: Medical

## 2018-02-10 VITALS — BP 92/69 | HR 118 | Temp 98.4°F | Resp 17 | Ht 63.0 in | Wt 123.6 lb

## 2018-02-10 DIAGNOSIS — I10 Essential (primary) hypertension: Secondary | ICD-10-CM | POA: Diagnosis not present

## 2018-02-10 DIAGNOSIS — R05 Cough: Secondary | ICD-10-CM

## 2018-02-10 DIAGNOSIS — F17210 Nicotine dependence, cigarettes, uncomplicated: Secondary | ICD-10-CM | POA: Diagnosis not present

## 2018-02-10 DIAGNOSIS — C3491 Malignant neoplasm of unspecified part of right bronchus or lung: Secondary | ICD-10-CM

## 2018-02-10 DIAGNOSIS — J069 Acute upper respiratory infection, unspecified: Secondary | ICD-10-CM | POA: Diagnosis not present

## 2018-02-10 DIAGNOSIS — Z923 Personal history of irradiation: Secondary | ICD-10-CM

## 2018-02-10 DIAGNOSIS — F329 Major depressive disorder, single episode, unspecified: Secondary | ICD-10-CM

## 2018-02-10 DIAGNOSIS — F331 Major depressive disorder, recurrent, moderate: Secondary | ICD-10-CM

## 2018-02-10 DIAGNOSIS — R42 Dizziness and giddiness: Secondary | ICD-10-CM

## 2018-02-10 DIAGNOSIS — R5383 Other fatigue: Secondary | ICD-10-CM

## 2018-02-10 DIAGNOSIS — R630 Anorexia: Secondary | ICD-10-CM | POA: Diagnosis not present

## 2018-02-10 DIAGNOSIS — C3401 Malignant neoplasm of right main bronchus: Secondary | ICD-10-CM

## 2018-02-10 DIAGNOSIS — Z79899 Other long term (current) drug therapy: Secondary | ICD-10-CM

## 2018-02-10 DIAGNOSIS — R634 Abnormal weight loss: Secondary | ICD-10-CM

## 2018-02-10 DIAGNOSIS — Z792 Long term (current) use of antibiotics: Secondary | ICD-10-CM

## 2018-02-10 DIAGNOSIS — Z5111 Encounter for antineoplastic chemotherapy: Secondary | ICD-10-CM | POA: Diagnosis not present

## 2018-02-10 DIAGNOSIS — R059 Cough, unspecified: Secondary | ICD-10-CM

## 2018-02-10 DIAGNOSIS — J9819 Other pulmonary collapse: Secondary | ICD-10-CM

## 2018-02-10 LAB — CMP (CANCER CENTER ONLY)
ALBUMIN: 2.3 g/dL — AB (ref 3.5–5.0)
ALT: 26 U/L (ref 0–55)
AST: 25 U/L (ref 5–34)
Alkaline Phosphatase: 74 U/L (ref 40–150)
Anion gap: 13 — ABNORMAL HIGH (ref 3–11)
BUN: 17 mg/dL (ref 7–26)
CHLORIDE: 97 mmol/L — AB (ref 98–109)
CO2: 21 mmol/L — AB (ref 22–29)
CREATININE: 0.7 mg/dL (ref 0.60–1.10)
Calcium: 9.4 mg/dL (ref 8.4–10.4)
GFR, Est AFR Am: >60 mL/min (ref >60)
GFR, Estimated: >60 mL/min (ref >60)
Glucose, Bld: 124 mg/dL (ref 70–140)
Potassium: 3.6 mmol/L (ref 3.5–5.1)
SODIUM: 131 mmol/L — AB (ref 136–145)
Total Bilirubin: 0.3 mg/dL (ref 0.2–1.2)
Total Protein: 6.8 g/dL (ref 6.4–8.3)

## 2018-02-10 LAB — CBC WITH DIFFERENTIAL (CANCER CENTER ONLY)
BASOS ABS: 0 10*3/uL (ref 0.0–0.1)
Basophils Relative: 0 %
EOS PCT: 0 %
Eosinophils Absolute: 0 10*3/uL (ref 0.0–0.5)
HEMATOCRIT: 30 % — AB (ref 34.8–46.6)
Hemoglobin: 10.4 g/dL — ABNORMAL LOW (ref 11.6–15.9)
LYMPHS ABS: 0.4 10*3/uL — AB (ref 0.9–3.3)
LYMPHS PCT: 12 %
MCH: 31.4 pg (ref 25.1–34.0)
MCHC: 34.7 g/dL (ref 31.5–36.0)
MCV: 90.5 fL (ref 79.5–101.0)
MONO ABS: 0.2 10*3/uL (ref 0.1–0.9)
Monocytes Relative: 6 %
Neutro Abs: 2.6 10*3/uL (ref 1.5–6.5)
Neutrophils Relative %: 82 %
PLATELETS: 259 10*3/uL (ref 145–400)
RBC: 3.31 MIL/uL — ABNORMAL LOW (ref 3.70–5.45)
RDW: 14.3 % (ref 11.2–14.5)
WBC Count: 3.2 10*3/uL — ABNORMAL LOW (ref 3.9–10.3)

## 2018-02-10 MED ORDER — BENZONATATE 100 MG PO CAPS: 100.0000 mg | ORAL_CAPSULE | Freq: Three times a day (TID) | ORAL | 0 refills | Status: DC | PRN

## 2018-02-10 MED ORDER — SODIUM CHLORIDE 0.9 % IV SOLN
Freq: Once | INTRAVENOUS | Status: AC
  Administered 2018-02-10: 14:00:00 via INTRAVENOUS

## 2018-02-10 MED ORDER — HYDROCOD POLST-CPM POLST ER 10-8 MG/5ML PO SUER: 5.0000 mL | Freq: Two times a day (BID) | ORAL | 0 refills | Status: DC | PRN

## 2018-02-10 MED ORDER — AZITHROMYCIN 250 MG PO TABS: ORAL_TABLET | ORAL | 0 refills | Status: DC

## 2018-02-10 NOTE — Telephone Encounter (Signed)
Pt reports new cough, body aches, headache , chills. Denies fever. Right hand "draws up". Son visited this weekend and he had a cold. Chemo 4/10 -taxol,carbo, Beryle Flock. Per Julien Nordmann -labs and [facility] today -appts made, pt aware.

## 2018-02-10 NOTE — Patient Instructions (Signed)
Dehydration, Adult Dehydration is when there is not enough fluid or water in your body. This happens when you lose more fluids than you take in. Dehydration can range from mild to very bad. It should be treated right away to keep it from getting very bad. Symptoms of mild dehydration may include:  Thirst.  Dry lips.  Slightly dry mouth.  Dry, warm skin.  Dizziness. Symptoms of moderate dehydration may include:  Very dry mouth.  Muscle cramps.  Dark pee (urine). Pee may be the color of tea.  Your body making less pee.  Your eyes making fewer tears.  Heartbeat that is uneven or faster than normal (palpitations).  Headache.  Light-headedness, especially when you stand up from sitting.  Fainting (syncope). Symptoms of very bad dehydration may include:  Changes in skin, such as: ? Cold and clammy skin. ? Blotchy (mottled) or pale skin. ? Skin that does not quickly return to normal after being lightly pinched and let go (poor skin turgor).  Changes in body fluids, such as: ? Feeling very thirsty. ? Your eyes making fewer tears. ? Not sweating when body temperature is high, such as in hot weather. ? Your body making very little pee.  Changes in vital signs, such as: ? Weak pulse. ? Pulse that is more than 100 beats a minute when you are sitting still. ? Fast breathing. ? Low blood pressure.  Other changes, such as: ? Sunken eyes. ? Cold hands and feet. ? Confusion. ? Lack of energy (lethargy). ? Trouble waking up from sleep. ? Short-term weight loss. ? Unconsciousness. Follow these instructions at home:  If told by your doctor, drink an ORS: ? Make an ORS by using instructions on the package. ? Start by drinking small amounts, about  cup (120 mL) every 5-10 minutes. ? Slowly drink more until you have had the amount that your doctor said to have.  Drink enough clear fluid to keep your pee clear or pale yellow. If you were told to drink an ORS, finish the ORS  first, then start slowly drinking clear fluids. Drink fluids such as: ? Water. Do not drink only water by itself. Doing that can make the salt (sodium) level in your body get too low (hyponatremia). ? Ice chips. ? Fruit juice that you have added water to (diluted). ? Low-calorie sports drinks.  Avoid: ? Alcohol. ? Drinks that have a lot of sugar. These include high-calorie sports drinks, fruit juice that does not have water added, and soda. ? Caffeine. ? Foods that are greasy or have a lot of fat or sugar.  Take over-the-counter and prescription medicines only as told by your doctor.  Do not take salt tablets. Doing that can make the salt level in your body get too high (hypernatremia).  Eat foods that have minerals (electrolytes). Examples include bananas, oranges, potatoes, tomatoes, and spinach.  Keep all follow-up visits as told by your doctor. This is important. Contact a doctor if:  You have belly (abdominal) pain that: ? Gets worse. ? Stays in one area (localizes).  You have a rash.  You have a stiff neck.  You get angry or annoyed more easily than normal (irritability).  You are more sleepy than normal.  You have a harder time waking up than normal.  You feel: ? Weak. ? Dizzy. ? Very thirsty.  You have peed (urinated) only a small amount of very dark pee during 6-8 hours. Get help right away if:  You have symptoms of   very bad dehydration.  You cannot drink fluids without throwing up (vomiting).  Your symptoms get worse with treatment.  You have a fever.  You have a very bad headache.  You are throwing up or having watery poop (diarrhea) and it: ? Gets worse. ? Does not go away.  You have blood or something green (bile) in your throw-up.  You have blood in your poop (stool). This may cause poop to look black and tarry.  You have not peed in 6-8 hours.  You pass out (faint).  Your heart rate when you are sitting still is more than 100 beats a  minute.  You have trouble breathing. This information is not intended to replace advice given to you by your health care provider. Make sure you discuss any questions you have with your health care provider. Document Released: 08/04/2009 Document Revised: 04/27/2016 Document Reviewed: 12/02/2015 Elsevier Interactive Patient Education  2018 [employer]vier Inc[employer]  

## 2018-02-10 NOTE — Progress Notes (Signed)
Pt reports gen body aches, sore throat, dry cough, and severe fatigue.  Denies N/V/D or fever/chills.  Pt appears very withdrawn and fatigued.

## 2018-02-12 ENCOUNTER — Telehealth: Payer: Self-pay | Admitting: Medical Oncology

## 2018-02-12 ENCOUNTER — Encounter (HOSPITAL_COMMUNITY): Payer: Self-pay

## 2018-02-12 ENCOUNTER — Other Ambulatory Visit: Payer: Self-pay

## 2018-02-12 ENCOUNTER — Emergency Department (HOSPITAL_COMMUNITY): Payer: Medicare HMO

## 2018-02-12 ENCOUNTER — Inpatient Hospital Stay (HOSPITAL_COMMUNITY): Payer: Medicare HMO

## 2018-02-12 ENCOUNTER — Inpatient Hospital Stay: Payer: Medicare HMO

## 2018-02-12 ENCOUNTER — Inpatient Hospital Stay (HOSPITAL_COMMUNITY)
Admission: EM | Admit: 2018-02-12 | Discharge: 2018-02-26 | DRG: 871 | Disposition: A | Discharge status: Home Health | Payer: Medicare HMO | Attending: Internal Medicine | Admitting: Internal Medicine

## 2018-02-12 ENCOUNTER — Telehealth: Payer: Self-pay | Admitting: General Practice

## 2018-02-12 DIAGNOSIS — E872 Acidosis: Secondary | ICD-10-CM | POA: Diagnosis present

## 2018-02-12 DIAGNOSIS — R05 Cough: Secondary | ICD-10-CM

## 2018-02-12 DIAGNOSIS — C3491 Malignant neoplasm of unspecified part of right bronchus or lung: Secondary | ICD-10-CM | POA: Diagnosis not present

## 2018-02-12 DIAGNOSIS — R7989 Other specified abnormal findings of blood chemistry: Secondary | ICD-10-CM

## 2018-02-12 DIAGNOSIS — Z87891 Personal history of nicotine dependence: Secondary | ICD-10-CM | POA: Diagnosis not present

## 2018-02-12 DIAGNOSIS — Z8249 Family history of ischemic heart disease and other diseases of the circulatory system: Secondary | ICD-10-CM

## 2018-02-12 DIAGNOSIS — C7931 Secondary malignant neoplasm of brain: Secondary | ICD-10-CM | POA: Diagnosis present

## 2018-02-12 DIAGNOSIS — Z515 Encounter for palliative care: Secondary | ICD-10-CM

## 2018-02-12 DIAGNOSIS — C7901 Secondary malignant neoplasm of right kidney and renal pelvis: Secondary | ICD-10-CM | POA: Diagnosis present

## 2018-02-12 DIAGNOSIS — I82412 Acute embolism and thrombosis of left femoral vein: Secondary | ICD-10-CM | POA: Diagnosis present

## 2018-02-12 DIAGNOSIS — G92 Toxic encephalopathy: Secondary | ICD-10-CM | POA: Diagnosis present

## 2018-02-12 DIAGNOSIS — Z7952 Long term (current) use of systemic steroids: Secondary | ICD-10-CM

## 2018-02-12 DIAGNOSIS — J189 Pneumonia, unspecified organism: Secondary | ICD-10-CM | POA: Diagnosis not present

## 2018-02-12 DIAGNOSIS — J9601 Acute respiratory failure with hypoxia: Secondary | ICD-10-CM

## 2018-02-12 DIAGNOSIS — Z923 Personal history of irradiation: Secondary | ICD-10-CM

## 2018-02-12 DIAGNOSIS — F329 Major depressive disorder, single episode, unspecified: Secondary | ICD-10-CM | POA: Diagnosis present

## 2018-02-12 DIAGNOSIS — I82422 Acute embolism and thrombosis of left iliac vein: Secondary | ICD-10-CM | POA: Diagnosis present

## 2018-02-12 DIAGNOSIS — Z9221 Personal history of antineoplastic chemotherapy: Secondary | ICD-10-CM

## 2018-02-12 DIAGNOSIS — I10 Essential (primary) hypertension: Secondary | ICD-10-CM | POA: Diagnosis present

## 2018-02-12 DIAGNOSIS — E871 Hypo-osmolality and hyponatremia: Secondary | ICD-10-CM | POA: Diagnosis not present

## 2018-02-12 DIAGNOSIS — Z801 Family history of malignant neoplasm of trachea, bronchus and lung: Secondary | ICD-10-CM | POA: Diagnosis not present

## 2018-02-12 DIAGNOSIS — C3411 Malignant neoplasm of upper lobe, right bronchus or lung: Secondary | ICD-10-CM | POA: Diagnosis present

## 2018-02-12 DIAGNOSIS — R059 Cough, unspecified: Secondary | ICD-10-CM

## 2018-02-12 DIAGNOSIS — Z800 Family history of malignant neoplasm of digestive organs: Secondary | ICD-10-CM | POA: Diagnosis not present

## 2018-02-12 DIAGNOSIS — J129 Viral pneumonia, unspecified: Secondary | ICD-10-CM | POA: Diagnosis present

## 2018-02-12 DIAGNOSIS — Z7189 Other specified counseling: Secondary | ICD-10-CM | POA: Diagnosis not present

## 2018-02-12 DIAGNOSIS — Z803 Family history of malignant neoplasm of breast: Secondary | ICD-10-CM | POA: Diagnosis not present

## 2018-02-12 DIAGNOSIS — R6520 Severe sepsis without septic shock: Secondary | ICD-10-CM | POA: Diagnosis present

## 2018-02-12 DIAGNOSIS — Z7982 Long term (current) use of aspirin: Secondary | ICD-10-CM

## 2018-02-12 DIAGNOSIS — Y95 Nosocomial condition: Secondary | ICD-10-CM | POA: Diagnosis present

## 2018-02-12 DIAGNOSIS — Z66 Do not resuscitate: Secondary | ICD-10-CM | POA: Diagnosis present

## 2018-02-12 DIAGNOSIS — J181 Lobar pneumonia, unspecified organism: Secondary | ICD-10-CM | POA: Diagnosis not present

## 2018-02-12 DIAGNOSIS — E440 Moderate protein-calorie malnutrition: Secondary | ICD-10-CM | POA: Diagnosis present

## 2018-02-12 DIAGNOSIS — I82890 Acute embolism and thrombosis of other specified veins: Secondary | ICD-10-CM | POA: Diagnosis present

## 2018-02-12 DIAGNOSIS — E46 Unspecified protein-calorie malnutrition: Secondary | ICD-10-CM | POA: Diagnosis present

## 2018-02-12 DIAGNOSIS — I82409 Acute embolism and thrombosis of unspecified deep veins of unspecified lower extremity: Secondary | ICD-10-CM

## 2018-02-12 DIAGNOSIS — L8990 Pressure ulcer of unspecified site, unspecified stage: Secondary | ICD-10-CM

## 2018-02-12 DIAGNOSIS — Z6822 Body mass index (BMI) 22.0-22.9, adult: Secondary | ICD-10-CM | POA: Diagnosis not present

## 2018-02-12 DIAGNOSIS — K760 Fatty (change of) liver, not elsewhere classified: Secondary | ICD-10-CM | POA: Diagnosis present

## 2018-02-12 DIAGNOSIS — D899 Disorder involving the immune mechanism, unspecified: Secondary | ICD-10-CM | POA: Diagnosis present

## 2018-02-12 DIAGNOSIS — R0600 Dyspnea, unspecified: Secondary | ICD-10-CM | POA: Diagnosis not present

## 2018-02-12 DIAGNOSIS — E1165 Type 2 diabetes mellitus with hyperglycemia: Secondary | ICD-10-CM | POA: Diagnosis present

## 2018-02-12 DIAGNOSIS — R945 Abnormal results of liver function studies: Secondary | ICD-10-CM

## 2018-02-12 DIAGNOSIS — I959 Hypotension, unspecified: Secondary | ICD-10-CM | POA: Diagnosis not present

## 2018-02-12 DIAGNOSIS — J9621 Acute and chronic respiratory failure with hypoxia: Secondary | ICD-10-CM

## 2018-02-12 DIAGNOSIS — A419 Sepsis, unspecified organism: Secondary | ICD-10-CM | POA: Diagnosis present

## 2018-02-12 DIAGNOSIS — M7989 Other specified soft tissue disorders: Secondary | ICD-10-CM | POA: Diagnosis not present

## 2018-02-12 DIAGNOSIS — K5900 Constipation, unspecified: Secondary | ICD-10-CM | POA: Diagnosis not present

## 2018-02-12 DIAGNOSIS — R0602 Shortness of breath: Secondary | ICD-10-CM

## 2018-02-12 DIAGNOSIS — I82402 Acute embolism and thrombosis of unspecified deep veins of left lower extremity: Secondary | ICD-10-CM | POA: Diagnosis not present

## 2018-02-12 DIAGNOSIS — D638 Anemia in other chronic diseases classified elsewhere: Secondary | ICD-10-CM | POA: Diagnosis present

## 2018-02-12 DIAGNOSIS — E861 Hypovolemia: Secondary | ICD-10-CM | POA: Diagnosis not present

## 2018-02-12 DIAGNOSIS — I82492 Acute embolism and thrombosis of other specified deep vein of left lower extremity: Secondary | ICD-10-CM | POA: Diagnosis not present

## 2018-02-12 DIAGNOSIS — R4182 Altered mental status, unspecified: Secondary | ICD-10-CM | POA: Diagnosis not present

## 2018-02-12 HISTORY — DX: Secondary malignant neoplasm of brain: C79.31

## 2018-02-12 HISTORY — DX: Malignant (primary) neoplasm, unspecified: C80.1

## 2018-02-12 HISTORY — DX: Secondary malignant neoplasm of unspecified kidney and renal pelvis: C79.00

## 2018-02-12 LAB — CBC WITH DIFFERENTIAL/PLATELET
Band Neutrophils: 0 %
Basophils Absolute: 0 10*3/uL (ref 0.0–0.1)
Basophils Relative: 0 %
Blasts: 0 %
EOS PCT: 0 %
Eosinophils Absolute: 0 10*3/uL (ref 0.0–0.7)
HEMATOCRIT: 31.1 % — AB (ref 36.0–46.0)
Hemoglobin: 10.9 g/dL — ABNORMAL LOW (ref 12.0–15.0)
LYMPHS ABS: 0.7 10*3/uL (ref 0.7–4.0)
LYMPHS PCT: 11 %
MCH: 31.6 pg (ref 26.0–34.0)
MCHC: 35 g/dL (ref 30.0–36.0)
MCV: 90.1 fL (ref 78.0–100.0)
MONOS PCT: 2 %
Metamyelocytes Relative: 1 %
Monocytes Absolute: 0.1 10*3/uL (ref 0.1–1.0)
Myelocytes: 0 %
NRBC: 0 /100{WBCs}
Neutro Abs: 5.9 10*3/uL (ref 1.7–7.7)
Neutrophils Relative %: 86 %
OTHER: 0 %
PLATELETS: 267 10*3/uL (ref 150–400)
Promyelocytes Relative: 0 %
RBC: 3.45 MIL/uL — AB (ref 3.87–5.11)
RDW: 15.1 % (ref 11.5–15.5)
WBC: 6.7 10*3/uL (ref 4.0–10.5)

## 2018-02-12 LAB — URINALYSIS, ROUTINE W REFLEX MICROSCOPIC
BILIRUBIN URINE: NEGATIVE
GLUCOSE, UA: NEGATIVE mg/dL
Hgb urine dipstick: NEGATIVE
KETONES UR: 5 mg/dL — AB
Nitrite: NEGATIVE
PROTEIN: 100 mg/dL — AB
Specific Gravity, Urine: 1.028 (ref 1.005–1.030)
pH: 5 (ref 5.0–8.0)

## 2018-02-12 LAB — LACTIC ACID, PLASMA: Lactic Acid, Venous: 1.9 mmol/L (ref 0.5–1.9)

## 2018-02-12 LAB — BLOOD GAS, ARTERIAL
ACID-BASE DEFICIT: 0.1 mmol/L (ref 0.0–2.0)
Bicarbonate: 24.1 mmol/L (ref 20.0–28.0)
DRAWN BY: 225631
FIO2: 100
O2 CONTENT: 15 L/min
O2 Saturation: 98.4 %
Patient temperature: 98.6
RATE: 32 resp/min
pCO2 arterial: 40 mmHg (ref 32.0–48.0)
pH, Arterial: 7.397 (ref 7.350–7.450)
pO2, Arterial: 151 mmHg — ABNORMAL HIGH (ref 83.0–108.0)

## 2018-02-12 LAB — CBC
HCT: 33.6 % — ABNORMAL LOW (ref 36.0–46.0)
Hemoglobin: 9.9 g/dL — ABNORMAL LOW (ref 12.0–15.0)
MCH: 26.8 pg (ref 26.0–34.0)
MCHC: 29.5 g/dL — ABNORMAL LOW (ref 30.0–36.0)
MCV: 91.1 fL (ref 78.0–100.0)
PLATELETS: 205 10*3/uL (ref 150–400)
RBC: 3.69 MIL/uL — AB (ref 3.87–5.11)
RDW: 15.4 % (ref 11.5–15.5)
WBC: 6.5 10*3/uL (ref 4.0–10.5)

## 2018-02-12 LAB — TYPE AND SCREEN
ABO/RH(D): O POS
Antibody Screen: NEGATIVE

## 2018-02-12 LAB — COMPREHENSIVE METABOLIC PANEL
ALT: 65 U/L — ABNORMAL HIGH (ref 14–54)
ANION GAP: 18 — AB (ref 5–15)
AST: 85 U/L — AB (ref 15–41)
Albumin: 2.8 g/dL — ABNORMAL LOW (ref 3.5–5.0)
Alkaline Phosphatase: 101 U/L (ref 38–126)
BUN: 22 mg/dL — AB (ref 6–20)
CHLORIDE: 99 mmol/L — AB (ref 101–111)
CO2: 23 mmol/L (ref 22–32)
Calcium: 9.5 mg/dL (ref 8.9–10.3)
Creatinine, Ser: 0.73 mg/dL (ref 0.44–1.00)
GFR calc Af Amer: >60 mL/min (ref >60)
Glucose, Bld: 109 mg/dL — ABNORMAL HIGH (ref 65–99)
POTASSIUM: 3.8 mmol/L (ref 3.5–5.1)
Sodium: 140 mmol/L (ref 135–145)
Total Bilirubin: 1 mg/dL (ref 0.3–1.2)
Total Protein: 7.9 g/dL (ref 6.5–8.1)

## 2018-02-12 LAB — ABO/RH: ABO/RH(D): O POS

## 2018-02-12 LAB — PROCALCITONIN: Procalcitonin: 2.78 ng/mL

## 2018-02-12 LAB — CREATININE, SERUM
CREATININE: 0.7 mg/dL (ref 0.44–1.00)
GFR calc Af Amer: >60 mL/min (ref >60)

## 2018-02-12 LAB — I-STAT TROPONIN, ED: Troponin i, poc: 0 ng/mL (ref 0.00–0.08)

## 2018-02-12 LAB — STREP PNEUMONIAE URINARY ANTIGEN: STREP PNEUMO URINARY ANTIGEN: NEGATIVE

## 2018-02-12 LAB — I-STAT CG4 LACTIC ACID, ED: LACTIC ACID, VENOUS: 2.17 mmol/L — AB (ref 0.5–1.9)

## 2018-02-12 MED ORDER — ONDANSETRON HCL 4 MG PO TABS: 4.0000 mg | ORAL_TABLET | Freq: Four times a day (QID) | ORAL | Status: DC | PRN

## 2018-02-12 MED ORDER — ACETAMINOPHEN 325 MG PO TABS
650.0000 mg | ORAL_TABLET | Freq: Four times a day (QID) | ORAL | Status: DC | PRN
  Administered 2018-02-13 – 2018-02-20 (×2): 650 mg via ORAL
  Filled 2018-02-12 (×3): qty 2

## 2018-02-12 MED ORDER — ENOXAPARIN SODIUM 40 MG/0.4ML ~~LOC~~ SOLN
40.0000 mg | Freq: Every day | SUBCUTANEOUS | Status: DC
  Administered 2018-02-13 (×2): 40 mg via SUBCUTANEOUS
  Filled 2018-02-12 (×2): qty 0.4

## 2018-02-12 MED ORDER — ACETAMINOPHEN 650 MG RE SUPP: 650.0000 mg | Freq: Four times a day (QID) | RECTAL | Status: DC | PRN

## 2018-02-12 MED ORDER — AMLODIPINE BESYLATE 5 MG PO TABS
5.0000 mg | ORAL_TABLET | Freq: Every day | ORAL | Status: DC
  Administered 2018-02-13 – 2018-02-26 (×14): 5 mg via ORAL
  Filled 2018-02-12 (×14): qty 1

## 2018-02-12 MED ORDER — ONDANSETRON HCL 4 MG/2ML IJ SOLN: 4.0000 mg | Freq: Four times a day (QID) | INTRAMUSCULAR | Status: DC | PRN

## 2018-02-12 MED ORDER — VANCOMYCIN HCL IN DEXTROSE 1-5 GM/200ML-% IV SOLN
1000.0000 mg | Freq: Once | INTRAVENOUS | Status: AC
  Administered 2018-02-12: 1000 mg via INTRAVENOUS
  Filled 2018-02-12: qty 200

## 2018-02-12 MED ORDER — PIPERACILLIN-TAZOBACTAM 3.375 G IVPB 30 MIN
3.3750 g | Freq: Once | INTRAVENOUS | Status: AC
  Administered 2018-02-12: 3.375 g via INTRAVENOUS
  Filled 2018-02-12: qty 50

## 2018-02-12 MED ORDER — SODIUM CHLORIDE 0.9 % IV SOLN
1.0000 g | Freq: Three times a day (TID) | INTRAVENOUS | Status: AC
  Administered 2018-02-13 – 2018-02-20 (×24): 1 g via INTRAVENOUS
  Filled 2018-02-12 (×24): qty 1

## 2018-02-12 MED ORDER — OXYCODONE HCL 5 MG PO TABS
5.0000 mg | ORAL_TABLET | ORAL | Status: DC | PRN
  Administered 2018-02-17: 5 mg via ORAL
  Filled 2018-02-12: qty 1

## 2018-02-12 MED ORDER — DEXAMETHASONE SODIUM PHOSPHATE 10 MG/ML IJ SOLN
4.0000 mg | Freq: Two times a day (BID) | INTRAMUSCULAR | Status: DC
  Administered 2018-02-13 – 2018-02-14 (×5): 4 mg via INTRAVENOUS
  Filled 2018-02-12 (×5): qty 1

## 2018-02-12 MED ORDER — SODIUM CHLORIDE 0.9 % IV BOLUS (SEPSIS)
500.0000 mL | Freq: Once | INTRAVENOUS | Status: AC
  Administered 2018-02-12: 500 mL via INTRAVENOUS

## 2018-02-12 MED ORDER — VANCOMYCIN HCL IN DEXTROSE 1-5 GM/200ML-% IV SOLN
1000.0000 mg | INTRAVENOUS | Status: DC
  Administered 2018-02-13 – 2018-02-17 (×5): 1000 mg via INTRAVENOUS
  Filled 2018-02-12 (×5): qty 200

## 2018-02-12 MED ORDER — SODIUM CHLORIDE 0.9 % IV BOLUS (SEPSIS)
250.0000 mL | Freq: Once | INTRAVENOUS | Status: AC
  Administered 2018-02-12: 250 mL via INTRAVENOUS

## 2018-02-12 MED ORDER — SODIUM CHLORIDE 0.9 % IV BOLUS (SEPSIS)
1000.0000 mL | Freq: Once | INTRAVENOUS | Status: AC
  Administered 2018-02-12: 1000 mL via INTRAVENOUS

## 2018-02-12 NOTE — Progress Notes (Signed)
Pharmacy Antibiotic Note  Beth Alexander is a 67 y.o. female admitted on 02/12/2018 with pneumonia.  Pharmacy has been consulted for vancomycin dosing.  Cefepime ordered per MD  Today, 02/12/2018  WBC WNL  Renal: SCr WNL  afebrile  Plan:  Vancomycin 1gm given in ED, then 1gm IV q24h  Monitor renal function  Check vancomycin levels if remains on vancomycin > 3-4 days  Cefepime dose appropriate  F/u ability to de-escalate antibiotics  Height: 5\' 3"  (160 cm) Weight: 123 lb (55.8 kg) IBW/kg (Calculated) : 52.4  Temp (24hrs), Avg:99.2 F (37.3 C), Min:99.2 F (37.3 C), Max:99.2 F (37.3 C)  Recent Labs  Lab 02/10/18 1211 02/12/18 1659 02/12/18 1704  WBC 3.2* 6.7  --   CREATININE 0.70 0.73  --   LATICACIDVEN  --   --  2.17*    Estimated Creatinine Clearance: 58 mL/min (by C-G formula based on SCr of 0.73 mg/dL).    No Known Allergies  Antimicrobials this admission:  4/24 vanco >> 4/24 zosyn x1 4/24 cefepime >>  Dose adjustments this admission:   Microbiology results:  4/24 BCx:  4/24 strep Ag 4/24 legionella Ag 4/24 Influenza: SPutum:   Thank you for allowing pharmacy to be a part of this patient's care.  Beth Alexander 02/12/2018 8:43 PM

## 2018-02-12 NOTE — Progress Notes (Signed)
Consults were received from an ED provider for vancomycin and zosyn per pharmacy dosing.  The patient's profile has been reviewed for ht/wt/allergies/indication/available labs.   One time orders have been placed by EDP for vancomycin 1gm x 1, zosyn 3.375gm IV x 1. No adjustment necessary.    Further antibiotics/pharmacy consults should be ordered by admitting physician if indicated.                       Thank you, Clovis Riley 02/12/2018  4:58 PM

## 2018-02-12 NOTE — ED Provider Notes (Signed)
[facility] DEPT Provider Note   CSN: [id number] Arrival date & time: 02/12/18  1615     History   Chief Complaint Chief Complaint  Patient presents with  . Weakness  . Lung Cancer    HPI Beth Alexander is a 67 y.o. female who presents with chills and AMS. PMH significant for lung cancer with mets to the brain and kidneys on chemo, HTN, tobacco abuse. The patient has had a cough for the past several days. She went to her oncologist two days ago and was prescribed antibiotics and had labs drawn which were overall normal. She has been taking the antibiotics. Today her sister went to pick her up for an appointment and she was having rigors and was confused so she brought her to the ED. In triage she was noted to be hypoxic. The patient reports ongoing cough and SOB. She denies chest pain, abdominal pain, N/V/D, urinary symptoms.  Oncologist: Dr. Conard Novak   HPI  Past Medical History:  Diagnosis Date  . Hypertension   . Tobacco use     Patient Active Problem List   Diagnosis Date Noted  . Protein calorie malnutrition ([place]) 01/29/2018  . Candidiasis 01/29/2018  . Brain metastasis ([place]) 01/20/2018  . Malignant neoplasm of bronchus of right upper lobe ([place]) 01/20/2018  . Encounter for antineoplastic chemotherapy 01/15/2018  . Encounter for antineoplastic immunotherapy 01/15/2018  . Goals of care, counseling/discussion 12/31/2017  . Squamous cell carcinoma of right lung ([place]) 12/18/2017  . Lung mass 12/10/2017  . Pleural effusion on right 12/10/2017  . Hypokalemia 12/10/2017  . Hypertension   . Tobacco use   . 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 BREAST LUMPECTOMY WITH RADIOACTIVE SEED LOCALIZATION Left 11/08/2016   Procedure: LEFT BREAST LUMPECTOMY WITH RADIOACTIVE SEED LOCALIZATION;  Surgeon: Donnie Mesa, MD;  Location: [facility];  Service: General;   Laterality: Left;  Marland Kitchen VIDEO BRONCHOSCOPY WITH ENDOBRONCHIAL ULTRASOUND N/A 12/11/2017   Procedure: VIDEO BRONCHOSCOPY WITH ENDOBRONCHIAL ULTRASOUND;  Surgeon: Marshell Garfinkel, MD;  Location: [facility];  Service: Pulmonary;  Laterality: N/A;     OB History    Gravida      Para      Term      Preterm      AB      Living  2     SAB      TAB      Ectopic      Multiple      Live Births               Home Medications    Prior to Admission medications   Medication Sig Start Date End Date Taking? Authorizing Provider  acetaminophen (TYLENOL) 500 MG tablet Take 500 mg by mouth every 6 (six) hours as needed for mild pain or headache.    [provider]  amLODipine (NORVASC) 5 MG tablet Take 1 tablet (5 mg total) by mouth daily. Patient not taking: Reported on 02/10/2018 06/30/14   Tysinger, Camelia Eng, PA-C  aspirin EC 81 MG tablet Take 81 mg by mouth daily.    [provider]  azithromycin (ZITHROMAX) 250 MG tablet 2 tabs day 1 then 1 tab daily for 4 days 02/10/18   Harle Stanford., PA-C  benzonatate (TESSALON) 100 MG capsule Take 1 capsule (100 mg total) by mouth 3 (three) times daily as needed for cough. 02/10/18   Tanner,  Lyndon Code., PA-C  chlorpheniramine-HYDROcodone (TUSSIONEX PENNKINETIC ER) 10-8 MG/5ML SUER Take 5 mLs by mouth every 12 (twelve) hours as needed for cough. 02/10/18   Tanner, Lyndon Code., PA-C  dexamethasone (DECADRON) 4 MG tablet Take 1 tablet (4 mg total) by mouth 2 (two) times daily with a meal. 01/17/18   Kyung Rudd, MD  fluconazole (DIFLUCAN) 100 MG tablet Take 1 tablet (100 mg total) by mouth daily. 01/29/18   Maryanna Shape, NP  LORazepam (ATIVAN) 0.5 MG tablet 1 tablet po 30 minutes prior to radiation or MRI, or every 4-6 hours prn anxiety 12/31/17   Hayden Pedro, PA-C  nicotine (NICODERM CQ - DOSED IN MG/24 HOURS) 14 mg/24hr patch Place 1 patch (14 mg total) onto the skin daily. Patient not taking: Reported on 02/10/2018 12/13/17   Domenic Polite, MD  oxyCODONE (OXY IR/ROXICODONE) 5 MG immediate release tablet Take 1 tablet (5 mg total) by mouth every 4 (four) hours as needed for severe pain. 01/07/18   Hayden Pedro, PA-C  prochlorperazine (COMPAZINE) 10 MG tablet Take 1 tablet (10 mg total) by mouth every 6 (six) hours as needed for nausea or vomiting. 01/15/18   Maryanna Shape, NP  sertraline (ZOLOFT) 50 MG tablet Take 1/2 tab daily for 1 week and than full tab daily Patient not taking: Reported on 02/10/2018 01/30/18   Kathlee Nations, MD    Family History Family History  Problem Relation Age of Onset  . Breast cancer Mother   . Heart disease Father   . Lung cancer Brother   . Colon cancer Brother     Social History Social History   Tobacco Use  . Smoking status: Former Smoker    Packs/day: 0.50    Years: 15.00    Pack years: 7.50    Last attempt to quit: 12/10/2017    Years since quitting: 0.1  . Smokeless tobacco: Never Used  . Tobacco comment: quit smoking 12/10/17--12/18/17  Substance Use Topics  . Alcohol use: No  . Drug use: No     Allergies   Patient has no known allergies.   Review of Systems Review of Systems  Constitutional: Positive for chills and unexpected weight change.  Respiratory: Positive for cough and shortness of breath.   Cardiovascular: Negative for chest pain.  Gastrointestinal: Negative for abdominal pain, diarrhea, nausea and vomiting.  Genitourinary: Negative for dysuria.  Allergic/Immunologic: Positive for immunocompromised state.  All other systems reviewed and are negative.    Physical Exam Updated Vital Signs BP 103/74 (BP Location: Right Arm)   Pulse (!) 131   Temp 99.2 F (37.3 C) (Oral)   Resp 19   Ht 5\' 3"  (1.6 m)   Wt 55.8 kg (123 lb)   SpO2 100%   BMI 21.79 kg/m   Physical Exam  Constitutional: She is oriented to person, place, and time. She appears well-developed and well-nourished. She appears ill. No distress.  On 15 L NRB  HENT:  Head:  Normocephalic and atraumatic.  Pale conjunctiva  Eyes: Pupils are equal, round, and reactive to light. Conjunctivae are normal. Right eye exhibits no discharge. Left eye exhibits no discharge. No scleral icterus.  Neck: Normal range of motion.  Cardiovascular: Tachycardia present. Exam reveals no gallop and no friction rub.  No murmur heard. Weak peripheral pulses  Pulmonary/Chest: Breath sounds normal. Tachypnea noted. No respiratory distress.  Abdominal: Soft. Bowel sounds are normal. She exhibits no distension. There is no tenderness.  Neurological: She is alert  and oriented to person, place, and time.  Skin: Skin is warm and dry.  Psychiatric: She has a normal mood and affect. Her behavior is normal.  Nursing note and vitals reviewed.    ED Treatments / Results  Labs (all labs ordered are listed, but only abnormal results are displayed) Labs Reviewed  COMPREHENSIVE METABOLIC PANEL - Abnormal; Notable for the following components:      Result Value   Chloride 99 (*)    Glucose, Bld 109 (*)    BUN 22 (*)    Albumin 2.8 (*)    AST 85 (*)    ALT 65 (*)    Anion gap 18 (*)    All other components within normal limits  CBC WITH DIFFERENTIAL/PLATELET - Abnormal; Notable for the following components:   RBC 3.45 (*)    Hemoglobin 10.9 (*)    HCT 31.1 (*)    All other components within normal limits  URINALYSIS, ROUTINE W REFLEX MICROSCOPIC - Abnormal; Notable for the following components:   Color, Urine AMBER (*)    APPearance CLOUDY (*)    Ketones, ur 5 (*)    Protein, ur 100 (*)    Leukocytes, UA TRACE (*)    Bacteria, UA RARE (*)    Non Squamous Epithelial 0-5 (*)    All other components within normal limits  CBC - Abnormal; Notable for the following components:   RBC 3.69 (*)    Hemoglobin 9.9 (*)    HCT 33.6 (*)    MCHC 29.5 (*)    All other components within normal limits  BLOOD GAS, ARTERIAL - Abnormal; Notable for the following components:   pO2, Arterial 151 (*)     All other components within normal limits  I-STAT CG4 LACTIC ACID, ED - Abnormal; Notable for the following components:   Lactic Acid, Venous 2.17 (*)    All other components within normal limits  CULTURE, BLOOD (ROUTINE X 2)  CULTURE, BLOOD (ROUTINE X 2)  CULTURE, EXPECTORATED SPUTUM-ASSESSMENT  GRAM STAIN  MRSA PCR SCREENING  STREP PNEUMONIAE URINARY ANTIGEN  CREATININE, SERUM  LACTIC ACID, PLASMA  PROCALCITONIN  BASIC METABOLIC PANEL  CBC  INFLUENZA PANEL BY PCR (TYPE A & B)  LEGIONELLA PNEUMOPHILA SEROGP 1 UR AG  LACTIC ACID, PLASMA  BRAIN NATRIURETIC PEPTIDE  I-STAT TROPONIN, ED  TYPE AND SCREEN  ABO/RH    EKG None  Radiology Ct Head Wo Contrast  Result Date: 02/12/2018 CLINICAL DATA:  Altered level of consciousness, history of right thalamus brain metastasis, status post radiation EXAM: CT HEAD WITHOUT CONTRAST TECHNIQUE: Contiguous axial images were obtained from the base of the skull through the vertex without intravenous contrast. COMPARISON:  01/10/2018 FINDINGS: Brain: Stable 2 cm right thalamus hypodense lesion correlating with the known metastasis status post radiation. No acute intracranial hemorrhage, new infarction, midline shift, herniation, hydrocephalus, extra-axial fluid collection. No focal mass effect or edema. Cisterns are patent. No cerebellar abnormality. Vascular: No hyperdense vessel or unexpected calcification. Skull: Normal. Negative for fracture or focal lesion. Sinuses/Orbits: No acute finding. Other: None. IMPRESSION: Stable 2 cm hypodense right thalamus lesion. No acute intracranial abnormality by noncontrast CT. Electronically Signed   By: Jerilynn Mages.  Shick M.D.   On: 02/12/2018 21:14   Dg Chest Port 1 View  Result Date: 02/12/2018 CLINICAL DATA:  Increasing shortness of breath. Low oxygen saturation. EXAM: PORTABLE CHEST 1 VIEW COMPARISON:  02/12/2018 FINDINGS: Shallow inspiration with elevation of the right hemidiaphragm. Mild linear atelectasis in  the lung  bases. There is asymmetrical interstitial infiltration throughout the left lung. This could represent asymmetrical edema or interstitial pneumonia. Similar appearance to previous study. No pleural effusions. No pneumothorax. Heart size and pulmonary vascularity are normal. IMPRESSION: Asymmetric interstitial pattern throughout the left lung likely represents interstitial pneumonia or asymmetrical edema. No change. Electronically Signed   By: Lucienne Capers M.D.   On: 02/12/2018 22:14   Dg Chest Port 1 View  Result Date: 02/12/2018 CLINICAL DATA:  Weakness, shortness of Breath EXAM: PORTABLE CHEST 1 VIEW COMPARISON:  12/11/2017 FINDINGS: Diffuse interstitial prominence throughout the left lung. Right lung is clear. Heart is normal size. No effusions. No acute bony abnormality. IMPRESSION: Diffuse interstitial prominence throughout the left lung, likely pneumonia. Electronically Signed   By: Rolm Baptise M.D.   On: 02/12/2018 17:42    Procedures Procedures (including critical care time)  CRITICAL CARE Performed by: Recardo Evangelist   Total critical care time: 30 minutes  Critical care time was exclusive of separately billable procedures and treating other patients.  Critical care was necessary to treat or prevent imminent or life-threatening deterioration.  Critical care was time spent personally by me on the following activities: development of treatment plan with patient and/or surrogate as well as nursing, discussions with consultants, evaluation of patient's response to treatment, examination of patient, obtaining history from patient or surrogate, ordering and performing treatments and interventions, ordering and review of laboratory studies, ordering and review of radiographic studies, pulse oximetry and re-evaluation of patient's condition.   Medications Ordered in ED Medications  amLODipine (NORVASC) tablet 5 mg (has no administration in time range)  oxyCODONE (Oxy  IR/ROXICODONE) immediate release tablet 5 mg (has no administration in time range)  acetaminophen (TYLENOL) tablet 650 mg (has no administration in time range)    Or  acetaminophen (TYLENOL) suppository 650 mg (has no administration in time range)  ondansetron (ZOFRAN) tablet 4 mg (has no administration in time range)    Or  ondansetron (ZOFRAN) injection 4 mg (has no administration in time range)  ceFEPIme (MAXIPIME) 1 g in sodium chloride 0.9 % 100 mL IVPB (has no administration in time range)  enoxaparin (LOVENOX) injection 40 mg (has no administration in time range)  dexamethasone (DECADRON) injection 4 mg (has no administration in time range)  vancomycin (VANCOCIN) IVPB 1000 mg/200 mL premix (has no administration in time range)  sodium chloride 0.9 % bolus 1,000 mL (0 mLs Intravenous Stopped 02/12/18 2104)    And  sodium chloride 0.9 % bolus 500 mL (0 mLs Intravenous Stopped 02/12/18 1906)    And  sodium chloride 0.9 % bolus 250 mL (0 mLs Intravenous Stopped 02/12/18 1917)  piperacillin-tazobactam (ZOSYN) IVPB 3.375 g (0 g Intravenous Stopped 02/12/18 1745)  vancomycin (VANCOCIN) IVPB 1000 mg/200 mL premix (0 mg Intravenous Stopped 02/12/18 1906)     Initial Impression / Assessment and Plan / ED Course  I have reviewed the triage vital signs and the nursing notes.  Pertinent labs & imaging results that were available during my care of the patient were reviewed by me and considered in my medical decision making (see chart for details).  67 year old female presents with sepsis due to pneumonia and acute respiratory failure.  She is hypoxic into the 60s upon arrival and tachycardic into the 130s.  Blood pressure is normal.  Temperature is elevated but she is not febrile. Rectal temp deferred due to her being neutropenic at baseline from chemo.  She was placed on 15 L on  nonrebreather and sats improved to 100%. Sepsis order set was utilized.  She was given 30 cc/kg bolus and broad-spectrum  antibiotics.   CBC remarkable for anemia at baseline.  CMP remarkable for mild elevation of transaminases.  Her anion gap is also elevated.  EKG is sinus tachycardia.  Troponin is normal.  Lactic acid is elevated 2.17.  Chest is clear is remarkable for a left lower lobe pneumonia.   Shared with Dr. Sherry Ruffing.  Will admit to medicine.  Discussed with Dr. Hal Hope who will admit.  Final Clinical Impressions(s) / ED Diagnoses   Final diagnoses:  Sepsis, due to unspecified organism [facility])  Pneumonia of left lower lobe due to infectious organism [facility])  Acute respiratory failure with hypoxia [facility])    ED Discharge Orders    None       Recardo Evangelist, PA-C 02/13/18 0008    Tegeler, Gwenyth Allegra, MD 02/14/18 1139

## 2018-02-12 NOTE — Telephone Encounter (Signed)
[facility] CSW Progress Note  Call to patient to check on her, left VM encouraging her to attend Living w Cancer Support group and check in w CSW as needed.    Edwyna Shell, LCSW Clinical Social Worker Phone:  [phone]

## 2018-02-12 NOTE — ED Triage Notes (Signed)
Her sister is with her and tells [place] that pt. Has been "real weak and not eating/drinking x 2 days". Pt. Arrives drowsy and in no distress. D/t her initial SPO2, we rapidly titrate her up to 100% Fio2 per non-rebreather mask. On 5 l.p.m. Of O2, her SPO2 was ~85.

## 2018-02-12 NOTE — ED Notes (Signed)
ED TO INPATIENT HANDOFF REPORT  Name/Age/Gender Beth Alexander 67 y.o. female  Code Status    Code Status Orders  (From admission, onward)        Start     Ordered   02/12/18 2041  Full code  Continuous     02/12/18 2041    Code Status History    Date Active Date Inactive Code Status Order ID Comments User Context   12/10/2017 0254 12/12/2017 1702 Full Code [id number]  Ivor Costa, MD ED    Advance Directive Documentation     Most Recent Value  Type of Advance Directive  Healthcare Power of [place], Living will  Pre-existing out of facility DNR order (yellow form or pink MOST form)  -  "MOST" Form in Place?  -      Home/SNF/Other Home  Chief Complaint cancer pt.with chills, wont drink or eat since Monday  Level of Care/Admitting Diagnosis ED Disposition    ED Disposition Condition [place]: [facility] [[id number]]  Level of Care: Stepdown [14]  Admit to SDU based on following criteria: Respiratory Distress:  Frequent assessment and/or intervention to maintain adequate ventilation/respiration, pulmonary toilet, and respiratory treatment.  Diagnosis: Sepsis [facility]) [[id number]]  Admitting Physician: Rise Patience [phone]  Attending Physician: Rise Patience [phone]  Estimated length of stay: past midnight tomorrow  Certification:: I certify this patient will need inpatient services for at least 2 midnights  PT Class (Do Not Modify): Inpatient [101]  PT Acc Code (Do Not Modify): Private [1]       Medical History Past Medical History:  Diagnosis Date  . Hypertension   . Tobacco use     Allergies No Known Allergies  IV Location/Drains/Wounds Patient Lines/Drains/Airways Status   Active Line/Drains/Airways    Name:   Placement date:   Placement time:   Site:   Days:   Peripheral IV 02/12/18 Right Antecubital   02/12/18    1703    Antecubital   less than 1   Peripheral IV 02/12/18 Left Antecubital   02/12/18    1705     Antecubital   less than 1          Labs/Imaging Results for orders placed or performed during the hospital encounter of 02/12/18 (from the past 48 hour(s))  Type and screen [place]     Status: None   Collection Time: 02/12/18  4:57 PM  Result Value Ref Range   ABO/RH(D) O POS    Antibody Screen NEG    Sample Expiration      02/15/2018 Performed at [facility], [place] [address] [place], [place] [address]   Comprehensive metabolic panel     Status: Abnormal   Collection Time: 02/12/18  4:59 PM  Result Value Ref Range   Sodium 140 135 - 145 mmol/L   Potassium 3.8 3.5 - 5.1 mmol/L   Chloride 99 (L) 101 - 111 mmol/L   CO2 23 22 - 32 mmol/L   Glucose, Bld 109 (H) 65 - 99 mg/dL   BUN 22 (H) 6 - 20 mg/dL   Creatinine, Ser 0.73 0.44 - 1.00 mg/dL   Calcium 9.5 8.9 - 10.3 mg/dL   Total Protein 7.9 6.5 - 8.1 g/dL   Albumin 2.8 (L) 3.5 - 5.0 g/dL   AST 85 (H) 15 - 41 U/L   ALT 65 (H) 14 - 54 U/L   Alkaline Phosphatase 101 38 - 126 U/L  Total Bilirubin 1.0 0.3 - 1.2 mg/dL   GFR calc non Af Amer >60 >60 mL/min   GFR calc Af Amer >60 >60 mL/min    Comment: (NOTE) The eGFR has been calculated using the CKD EPI equation. This calculation has not been validated in all clinical situations. eGFR's persistently <60 mL/min signify possible Chronic Kidney Disease.    Anion gap 18 (H) 5 - 15    Comment: Performed at [facility], [place] [address], [place], [place] [address]  CBC WITH DIFFERENTIAL     Status: Abnormal   Collection Time: 02/12/18  4:59 PM  Result Value Ref Range   WBC 6.7 4.0 - 10.5 K/uL   RBC 3.45 (L) 3.87 - 5.11 MIL/uL   Hemoglobin 10.9 (L) 12.0 - 15.0 g/dL   HCT 31.1 (L) 36.0 - 46.0 %   MCV 90.1 78.0 - 100.0 fL   MCH 31.6 26.0 - 34.0 pg   MCHC 35.0 30.0 - 36.0 g/dL   RDW 15.1 11.5 - 15.5 %   Platelets 267 150 - 400 K/uL   Neutrophils Relative % 86 %   Lymphocytes Relative 11 %   Monocytes Relative 2 %    Eosinophils Relative 0 %   Basophils Relative 0 %   Band Neutrophils 0 %   Metamyelocytes Relative 1 %   Myelocytes 0 %   Promyelocytes Relative 0 %   Blasts 0 %   nRBC 0 0 /100 WBC   Other 0 %   Neutro Abs 5.9 1.7 - 7.7 K/uL   Lymphs Abs 0.7 0.7 - 4.0 K/uL   Monocytes Absolute 0.1 0.1 - 1.0 K/uL   Eosinophils Absolute 0.0 0.0 - 0.7 K/uL   Basophils Absolute 0.0 0.0 - 0.1 K/uL   Smear Review MORPHOLOGY UNREMARKABLE     Comment: Performed at [facility], [place] [address], [place], [place] [address]  I-stat troponin, ED     Status: None   Collection Time: 02/12/18  5:02 PM  Result Value Ref Range   Troponin i, poc 0.00 0.00 - 0.08 ng/mL   Comment 3            Comment: Due to the release kinetics of cTnI, a negative result within the first hours of the onset of symptoms does not rule out myocardial infarction with certainty. If myocardial infarction is still suspected, repeat the test at appropriate intervals.   I-Stat CG4 Lactic Acid, ED  (not at  [facility])     Status: Abnormal   Collection Time: 02/12/18  5:04 PM  Result Value Ref Range   Lactic Acid, Venous 2.17 (HH) 0.5 - 1.9 mmol/L   Comment NOTIFIED PHYSICIAN   CBC     Status: Abnormal   Collection Time: 02/12/18  8:40 PM  Result Value Ref Range   WBC 6.5 4.0 - 10.5 K/uL   RBC 3.69 (L) 3.87 - 5.11 MIL/uL   Hemoglobin 9.9 (L) 12.0 - 15.0 g/dL   HCT 33.6 (L) 36.0 - 46.0 %   MCV 91.1 78.0 - 100.0 fL   MCH 26.8 26.0 - 34.0 pg   MCHC 29.5 (L) 30.0 - 36.0 g/dL   RDW 15.4 11.5 - 15.5 %   Platelets 205 150 - 400 K/uL    Comment: Performed at [facility], [place] [address] [place], [place] [address]  Creatinine, serum     Status: None   Collection Time: 02/12/18  8:40 PM  Result Value Ref Range  Creatinine, Ser 0.70 0.44 - 1.00 mg/dL   GFR calc non Af Amer >60 >60 mL/min   GFR calc Af Amer >60 >60 mL/min    Comment: (NOTE) The eGFR has been calculated using the CKD EPI equation. This  calculation has not been validated in all clinical situations. eGFR's persistently <60 mL/min signify possible Chronic Kidney Disease. Performed at [facility], [place] [address], [place], [place] [address]   Procalcitonin - Baseline     Status: None   Collection Time: 02/12/18  8:40 PM  Result Value Ref Range   Procalcitonin 2.78 ng/mL    Comment:        Interpretation: PCT > 2 ng/mL: Systemic infection (sepsis) is likely, unless other causes are known. (NOTE)       Sepsis PCT Algorithm           Lower Respiratory Tract                                      Infection PCT Algorithm    ----------------------------     ----------------------------         PCT < 0.25 ng/mL                PCT < 0.10 ng/mL         Strongly encourage             Strongly discourage   discontinuation of antibiotics    initiation of antibiotics    ----------------------------     -----------------------------       PCT 0.25 - 0.50 ng/mL            PCT 0.10 - 0.25 ng/mL               OR       >80% decrease in PCT            Discourage initiation of                                            antibiotics      Encourage discontinuation           of antibiotics    ----------------------------     -----------------------------         PCT >= 0.50 ng/mL              PCT 0.26 - 0.50 ng/mL               AND       <80% decrease in PCT              Encourage initiation of                                             antibiotics       Encourage continuation           of antibiotics    ----------------------------     -----------------------------        PCT >= 0.50 ng/mL                  PCT > 0.50 ng/mL  AND         increase in PCT                  Strongly encourage                                      initiation of antibiotics    Strongly encourage escalation           of antibiotics                                     -----------------------------                                            PCT <= 0.25 ng/mL                                                 OR                                        > 80% decrease in PCT                                     Discontinue / Do not initiate                                             antibiotics Performed at [place] [address], [place], [place] [address]   Urinalysis, Routine w reflex microscopic     Status: Abnormal   Collection Time: 02/12/18  8:42 PM  Result Value Ref Range   Color, Urine AMBER (A) YELLOW    Comment: BIOCHEMICALS MAY BE AFFECTED BY COLOR   APPearance CLOUDY (A) CLEAR   Specific Gravity, Urine 1.028 1.005 - 1.030   pH 5.0 5.0 - 8.0   Glucose, UA NEGATIVE NEGATIVE mg/dL   Hgb urine dipstick NEGATIVE NEGATIVE   Bilirubin Urine NEGATIVE NEGATIVE   Ketones, ur 5 (A) NEGATIVE mg/dL   Protein, ur 100 (A) NEGATIVE mg/dL   Nitrite NEGATIVE NEGATIVE   Leukocytes, UA TRACE (A) NEGATIVE   RBC / HPF 0-5 0 - 5 RBC/hpf   WBC, UA 11-20 0 - 5 WBC/hpf   Bacteria, UA RARE (A) NONE SEEN   Squamous Epithelial / LPF 11-20 0 - 5    Comment: Please note change in reference range.   Mucus PRESENT    Hyaline Casts, UA PRESENT    Ca Oxalate Crys, UA PRESENT    Non Squamous Epithelial 0-5 (A) NONE SEEN    Comment: Performed at [facility], [place] [address], [place], [place] [address]  Lactic acid, plasma     Status: None   Collection Time: 02/12/18  8:42 PM  Result Value Ref Range   Lactic Acid, Venous 1.9  0.5 - 1.9 mmol/L    Comment: Performed at [facility], [place] [address], [place], [place] [address]  Blood gas, arterial     Status: Abnormal   Collection Time: 02/12/18 10:20 PM  Result Value Ref Range   FIO2 100.00    O2 Content 15.0 L/min   Delivery systems NON-REBREATHER OXYGEN MASK    LHR 32 resp/min   pH, Arterial 7.397 7.350 - 7.450   pCO2 arterial 40.0 32.0 - 48.0 mmHg   pO2, Arterial 151 (H) 83.0 - 108.0 mmHg   Bicarbonate 24.1 20.0 -  28.0 mmol/L   Acid-base deficit 0.1 0.0 - 2.0 mmol/L   O2 Saturation 98.4 %   Patient temperature 98.6    Collection site RADIAL    Drawn by [id number]    Sample type ARTERIAL DRAW    Allens test (pass/fail) PASS PASS    Comment: Performed at [facility], [place] [address] [place], [place] [address]   Ct Head Wo Contrast  Result Date: 02/12/2018 CLINICAL DATA:  Altered level of consciousness, history of right thalamus brain metastasis, status post radiation EXAM: CT HEAD WITHOUT CONTRAST TECHNIQUE: Contiguous axial images were obtained from the base of the skull through the vertex without intravenous contrast. COMPARISON:  01/10/2018 FINDINGS: Brain: Stable 2 cm right thalamus hypodense lesion correlating with the known metastasis status post radiation. No acute intracranial hemorrhage, new infarction, midline shift, herniation, hydrocephalus, extra-axial fluid collection. No focal mass effect or edema. Cisterns are patent. No cerebellar abnormality. Vascular: No hyperdense vessel or unexpected calcification. Skull: Normal. Negative for fracture or focal lesion. Sinuses/Orbits: No acute finding. Other: None. IMPRESSION: Stable 2 cm hypodense right thalamus lesion. No acute intracranial abnormality by noncontrast CT. Electronically Signed   By: Jerilynn Mages.  Shick M.D.   On: 02/12/2018 21:14   Dg Chest Port 1 View  Result Date: 02/12/2018 CLINICAL DATA:  Increasing shortness of breath. Low oxygen saturation. EXAM: PORTABLE CHEST 1 VIEW COMPARISON:  02/12/2018 FINDINGS: Shallow inspiration with elevation of the right hemidiaphragm. Mild linear atelectasis in the lung bases. There is asymmetrical interstitial infiltration throughout the left lung. This could represent asymmetrical edema or interstitial pneumonia. Similar appearance to previous study. No pleural effusions. No pneumothorax. Heart size and pulmonary vascularity are normal. IMPRESSION: Asymmetric interstitial pattern throughout the left  lung likely represents interstitial pneumonia or asymmetrical edema. No change. Electronically Signed   By: Lucienne Capers M.D.   On: 02/12/2018 22:14   Dg Chest Port 1 View  Result Date: 02/12/2018 CLINICAL DATA:  Weakness, shortness of Breath EXAM: PORTABLE CHEST 1 VIEW COMPARISON:  12/11/2017 FINDINGS: Diffuse interstitial prominence throughout the left lung. Right lung is clear. Heart is normal size. No effusions. No acute bony abnormality. IMPRESSION: Diffuse interstitial prominence throughout the left lung, likely pneumonia. Electronically Signed   By: Rolm Baptise M.D.   On: 02/12/2018 17:42    Pending Labs Unresulted Labs (From admission, onward)   Start     Ordered   02/19/18 0500  Creatinine, serum  (enoxaparin (LOVENOX)    CrCl >/= 30 ml/min)  Weekly,   R    Comments:  while on enoxaparin therapy    02/12/18 2041   02/13/18 7616  Basic metabolic panel  Tomorrow morning,   R     02/12/18 2041   02/13/18 0500  CBC  Tomorrow morning,   R     02/12/18 2041   02/12/18 2042  Lactic acid, plasma  STAT Now then every 3  hours,   R     02/12/18 2041   02/12/18 2042  Brain natriuretic peptide  Once,   R     02/12/18 2041   02/12/18 2040  Culture, sputum-assessment  Once,   R     02/12/18 2041   02/12/18 2040  Gram stain  Once,   R     02/12/18 2041   02/12/18 2040  Strep pneumoniae urinary antigen  Once,   R     02/12/18 2041   02/12/18 2040  Influenza panel by PCR (type A & B)  (Influenza PCR Panel)  Once,   R     02/12/18 2041   02/12/18 2040  Legionella Pneumophila Serogp 1 Ur Ag  Once,   R     02/12/18 2041   02/12/18 1657  ABO/Rh  Once,   R     02/12/18 1657   02/12/18 1639  Blood Culture (routine x 2)  BLOOD CULTURE X 2,   STAT     02/12/18 1638      Vitals/Pain Today's Vitals   02/12/18 2048 02/12/18 2130 02/12/18 2200 02/12/18 2230  BP:  114/77 123/75 120/75  Pulse: (!) 118 (!) 121 (!) 117 (!) 113  Resp: (!) 32 (!) 33 (!) 34 (!) 28  Temp:      TempSrc:       SpO2: 92% 98% 97% 100%  Weight:      Height:      PainSc:        Isolation Precautions Droplet precaution  Medications Medications  amLODipine (NORVASC) tablet 5 mg (has no administration in time range)  oxyCODONE (Oxy IR/ROXICODONE) immediate release tablet 5 mg (has no administration in time range)  acetaminophen (TYLENOL) tablet 650 mg (has no administration in time range)    Or  acetaminophen (TYLENOL) suppository 650 mg (has no administration in time range)  ondansetron (ZOFRAN) tablet 4 mg (has no administration in time range)    Or  ondansetron (ZOFRAN) injection 4 mg (has no administration in time range)  ceFEPIme (MAXIPIME) 1 g in sodium chloride 0.9 % 100 mL IVPB (has no administration in time range)  enoxaparin (LOVENOX) injection 40 mg (has no administration in time range)  dexamethasone (DECADRON) injection 4 mg (has no administration in time range)  vancomycin (VANCOCIN) IVPB 1000 mg/200 mL premix (has no administration in time range)  sodium chloride 0.9 % bolus 1,000 mL (0 mLs Intravenous Stopped 02/12/18 2104)    And  sodium chloride 0.9 % bolus 500 mL (0 mLs Intravenous Stopped 02/12/18 1906)    And  sodium chloride 0.9 % bolus 250 mL (0 mLs Intravenous Stopped 02/12/18 1917)  piperacillin-tazobactam (ZOSYN) IVPB 3.375 g (0 g Intravenous Stopped 02/12/18 1745)  vancomycin (VANCOCIN) IVPB 1000 mg/200 mL premix (0 mg Intravenous Stopped 02/12/18 1906)

## 2018-02-12 NOTE — Telephone Encounter (Signed)
[name] called about pt .  Luciann wants to cancel lab today because she  is  not feeling well, her legs are weak and she is shaking-she has not had anything to drink or eat today.I strongly encouraged [name] to take pt to ED if symptoms worsen or pt has fever. I heard pt coughing in background and asked [name] to take her temp. She could not get a reading . [name] said she is short of breath . Again I instructed her to take pt to ED

## 2018-02-12 NOTE — Progress Notes (Signed)
[facility] Progress Note   Jenie Parish [id number] 01-04-1951 67 y.o.  Sarabeth Benton is managed by Dr. Fanny Bien. Mohamed  Actively treated with chemotherapy: yes  Current Therapy: Carboplatin, paclitaxel, and Keytruda  Last Treated: 01/29/2018 (cycle 1, day 1)  Assessment: Plan:    Orthostatic dizziness - Plan: 0.9 %  sodium chloride infusion  Cough - Plan: benzonatate (TESSALON) 100 MG capsule, chlorpheniramine-HYDROcodone (TUSSIONEX PENNKINETIC ER) 10-8 MG/5ML SUER  Upper respiratory tract infection, unspecified type - Plan: azithromycin (ZITHROMAX) 250 MG tablet  Moderate episode of recurrent major depressive disorder (HCC)   Orthostatic dizziness: The patient was given 1 L of normal saline IV.  Nonproductive cough: The patient was given a prescription for Tessalon Perles and Tussionex.  URI: The patient was given a prescription for a Z-Pak.  Moderate episode of recurrent major depressive disorder: The patient was told to start Zoloft as prescribed by her psychiatrist.  She is agreeable to begin this medication.  Please see After Visit Summary for patient specific instructions.  Future Appointments  Date Time Provider [place]  02/17/2018  9:30 AM Hayden Pedro, PA-C [facility] None  02/19/2018 11:00 AM [facility] ONLY [facility] None  02/19/2018 11:30 AM Maryanna Shape, NP [facility] None  02/19/2018 12:30 PM [facility] G23 [facility] None  02/19/2018  2:30 PM Karie Mainland, RD [facility] None  02/24/2018  2:30 PM Arfeen, Arlyce Harman, MD [facility] None  02/26/2018  3:30 PM [facility] LAB 6 [facility] None  03/05/2018  3:30 PM [facility] LAB 6 [facility] None  03/12/2018  8:45 AM [facility] LAB 2 [facility] None  03/12/2018  9:15 AM Curt Bears, MD [facility] None  03/12/2018 10:15 AM [facility] F21 [facility] None  03/19/2018  3:30 PM [facility] LAB 6 [facility] None  03/26/2018  3:30 PM [facility] LAB 3 [facility] None    04/02/2018  9:00 AM [facility] LAB 3 [facility] None  04/02/2018  9:30 AM Curt Bears, MD [facility] None  04/02/2018 10:15 AM [facility] G23 [facility] None  04/02/2018 12:00 PM Karie Mainland, RD [facility] None    No orders of the defined types were placed in this encounter.      Subjective:   Patient ID:  Kaneshia Cater is a 67 y.o. (DOB 21-Oct-1951) female.  Chief Complaint:  Chief Complaint  Patient presents with  . Fatigue    HPI Herta Hink is a 67 year old female with a history of a stage IV non-small cell lung cancer, squamous cell carcinoma who originally presented with a large right hilar mass with encasement of the right pulmonary artery and superior vena cava.  She additionally has right upper lobe collapse, precarinal lymphadenopathy and a brain lesion.  She is status post SRS to the brain lesion and palliative radiation to the right upper lobe which she completed on 01/17/2018.  She continues to be followed by Dr. Fanny Bien. Mohamed and was most recently treated with cycle 1, day 1 of carboplatin, paclitaxel, and Keytruda on 01/29/2018.  She presents to the office today with a nonproductive cough, generalized myalgias, fatigue, sore throat, and increasing peripheral nephropathy.  She also reports having a history of depression.  She had been given a prescription for Zoloft 50 mg from her psychiatrist with instructions to take 1/2 tablet for 1 week then increase to 1 full tablet.  She did not begin this medication as directed due to concerns regarding potential interactions with other medications.  She denies fevers, chills, or sweats.  She is having some dizziness with  positional changes.  Medications: I have reviewed the patient's current medications.  Allergies: No Known Allergies  Past Medical History:  Diagnosis Date  . Hypertension   . Tobacco use     Past Surgical History:  Procedure Laterality Date  . BREAST LUMPECTOMY WITH RADIOACTIVE SEED LOCALIZATION  Left 11/08/2016   Procedure: LEFT BREAST LUMPECTOMY WITH RADIOACTIVE SEED LOCALIZATION;  Surgeon: Donnie Mesa, MD;  Location: [place];  Service: General;  Laterality: Left;  Marland Kitchen VIDEO BRONCHOSCOPY WITH ENDOBRONCHIAL ULTRASOUND N/A 12/11/2017   Procedure: VIDEO BRONCHOSCOPY WITH ENDOBRONCHIAL ULTRASOUND;  Surgeon: Marshell Garfinkel, MD;  Location: [place];  Service: Pulmonary;  Laterality: N/A;    Family History  Problem Relation Age of Onset  . Breast cancer Mother   . Heart disease Father   . Lung cancer Brother   . Colon cancer Brother     Social History   Socioeconomic History  . Marital status: Divorced    Spouse name: Not on file  . Number of children: Not on file  . Years of education: Not on file  . Highest education level: Not on file  Occupational History  . Not on file  Social Needs  . Financial resource strain: Not on file  . Food insecurity:    Worry: Not on file    Inability: Not on file  . Transportation needs:    Medical: Not on file    Non-medical: Not on file  Tobacco Use  . Smoking status: Former Smoker    Packs/day: 0.50    Years: 15.00    Pack years: 7.50    Last attempt to quit: 12/10/2017    Years since quitting: 0.1  . Smokeless tobacco: Never Used  . Tobacco comment: quit smoking 12/10/17--12/18/17  Substance and Sexual Activity  . Alcohol use: No  . Drug use: No  . Sexual activity: Not on file  Lifestyle  . Physical activity:    Days per week: Not on file    Minutes per session: Not on file  . Stress: Not on file  Relationships  . Social connections:    Talks on phone: Not on file    Gets together: Not on file    Attends religious service: Not on file    Active member of club or organization: Not on file    Attends meetings of clubs or organizations: Not on file    Relationship status: Not on file  . Intimate partner violence:    Fear of current or ex partner: Not on file    Emotionally abused: Not on file    Physically  abused: Not on file    Forced sexual activity: Not on file  Other Topics Concern  . Not on file  Social History Narrative  . Not on file    Past Medical History, Surgical history, Social history, and Family history were reviewed and updated as appropriate.   Please see review of systems for further details on the patient's review from today.   Review of Systems:  Review of Systems  Constitutional: Positive for fatigue. Negative for chills, diaphoresis and fever.  HENT: Negative for rhinorrhea and voice change.   Respiratory: Positive for cough. Negative for chest tightness, shortness of breath and wheezing.   Cardiovascular: Negative for chest pain, palpitations and leg swelling.  Musculoskeletal: Positive for myalgias.  Neurological: Positive for numbness (Peripheral neuropathy). Negative for headaches.  Psychiatric/Behavioral: Positive for dysphoric mood. Negative for self-injury and suicidal ideas.    Objective:  Physical Exam:  BP 92/69 (BP Location: Left Arm, Patient Position: Sitting) Comment: nurse aware of bp  Pulse (!) 118 Comment: nurse aware of pulse  Temp 98.4 F (36.9 C) (Oral)   Resp 17   Ht 5\' 3"  (1.6 m)   Wt 123 lb 9.6 oz (56.1 kg)   SpO2 96%   BMI 21.89 kg/m  ECOG: 1  Physical Exam  Constitutional: No distress.  The patient is an adult female who appears to be depressed and has a flat affect.  She does not appear to be in any acute distress.  HENT:  Head: Normocephalic and atraumatic.  Oropharynx is clear but appears to be dry.  Eyes: Right eye exhibits no discharge. Left eye exhibits no discharge. No scleral icterus.  Cardiovascular: S1 normal and S2 normal. Tachycardia present. Exam reveals no distant heart sounds and no friction rub.  No murmur heard. Pulmonary/Chest:      Abdominal: Bowel sounds are normal. She exhibits no distension. There is no tenderness. There is no guarding.  Neurological: She is alert. She displays normal reflexes.    Skin: Skin is warm and dry. She is not diaphoretic.  Psychiatric:  The patient appears to be depressed.    Lab Review:     Component Value Date/Time   NA 131 (L) 02/10/2018 1211   K 3.6 02/10/2018 1211   CL 97 (L) 02/10/2018 1211   CO2 21 (L) 02/10/2018 1211   GLUCOSE 124 02/10/2018 1211   BUN 17 02/10/2018 1211   CREATININE 0.70 02/10/2018 1211   CALCIUM 9.4 02/10/2018 1211   PROT 6.8 02/10/2018 1211   ALBUMIN 2.3 (L) 02/10/2018 1211   AST 25 02/10/2018 1211   ALT 26 02/10/2018 1211   ALKPHOS 74 02/10/2018 1211   BILITOT 0.3 02/10/2018 1211   GFRNONAA >60 02/10/2018 1211   GFRAA >60 02/10/2018 1211       Component Value Date/Time   WBC 3.2 (L) 02/10/2018 1211   WBC 9.5 01/21/2018 1755   RBC 3.31 (L) 02/10/2018 1211   HGB 10.4 (L) 02/10/2018 1211   HCT 30.0 (L) 02/10/2018 1211   PLT 259 02/10/2018 1211   MCV 90.5 02/10/2018 1211   MCH 31.4 02/10/2018 1211   MCHC 34.7 02/10/2018 1211   RDW 14.3 02/10/2018 1211   LYMPHSABS 0.4 (L) 02/10/2018 1211   MONOABS 0.2 02/10/2018 1211   EOSABS 0.0 02/10/2018 1211   BASOSABS 0.0 02/10/2018 1211   -------------------------------  Imaging from last 24 hours (if applicable):  Radiology interpretation: No results found.      This case was discussed with Dr. Julien Nordmann. He expressed agreement with my management of this patient.

## 2018-02-12 NOTE — Progress Notes (Signed)
Pt gave permission for her sister to remain in the room while the questions were asked on the nursing admission hx.  Lucius Conn BSN, RN-BC Admissions RN 02/12/2018 5:13 PM

## 2018-02-12 NOTE — ED Notes (Signed)
Patient transported to CT 

## 2018-02-12 NOTE — H&P (Signed)
History and Physical    Beth Alexander DTO:[id number] DOB: 02/26/1951 DOA: 02/12/2018  PCP: [name], MD  Patient coming from: Home.  Chief Complaint: Altered mental status and chills.  History obtained from patient's sister and patient in previous records.  HPI: Beth Alexander is a 67 y.o. 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  Patient has been having symptoms of upper respiratory infection over the last 4 days and states her son also had similar infection.  Patient had gone to her oncologist 2 days ago and was placed on antibiotics.  Today patient sister came to pick her up for appointment when patient was found to be weak and confused and was having chills.  Patient was brought to the ER.  ED Course: The patient is found to be tachycardic with hypoxia.  Initially requiring nonrebreather.  Chest x-ray shows features concerning for pneumonia patient was mildly febrile.  Blood cultures were obtained and placed on epic antibiotics for healthcare associated pneumonia.  On exam patient is alert awake oriented and follows commands.  Denies any chest pain nausea vomiting abdominal pain or diarrhea.  Review of Systems: As per HPI, rest all negative.   Past Medical History:  Diagnosis Date  . Hypertension   . Tobacco use     Past Surgical History:  Procedure Laterality Date  . BREAST LUMPECTOMY WITH RADIOACTIVE SEED LOCALIZATION Left 11/08/2016   Procedure: LEFT BREAST LUMPECTOMY WITH RADIOACTIVE SEED LOCALIZATION;  Surgeon: Donnie Mesa, MD;  Location: [place];  Service: General;  Laterality: Left;  Marland Kitchen VIDEO BRONCHOSCOPY WITH ENDOBRONCHIAL ULTRASOUND N/A 12/11/2017   Procedure: VIDEO BRONCHOSCOPY WITH ENDOBRONCHIAL ULTRASOUND;  Surgeon: Marshell Garfinkel, MD;  Location: [place];  Service:  Pulmonary;  Laterality: N/A;     reports that she quit smoking about 2 months ago. She has a 7.50 pack-year smoking history. She has never used smokeless tobacco. She reports that she does not drink alcohol or use drugs.  No Known Allergies  Family History  Problem Relation Age of Onset  . Breast cancer Mother   . Heart disease Father   . Lung cancer Brother   . Colon cancer Brother     Prior to Admission medications   Medication Sig Start Date End Date Taking? Authorizing Provider  acetaminophen (TYLENOL) 500 MG tablet Take 500 mg by mouth every 6 (six) hours as needed for mild pain or headache.   Yes [provider]  amLODipine (NORVASC) 5 MG tablet Take 1 tablet (5 mg total) by mouth daily. 06/30/14  Yes Tysinger, Camelia Eng, PA-C  azithromycin (ZITHROMAX) 250 MG tablet 2 tabs day 1 then 1 tab daily for 4 days 02/10/18  Yes Tanner, Lyndon Code., PA-C  chlorpheniramine-HYDROcodone (TUSSIONEX PENNKINETIC ER) 10-8 MG/5ML SUER Take 5 mLs by mouth every 12 (twelve) hours as needed for cough. 02/10/18  Yes Tanner, Lyndon Code., PA-C  dexamethasone (DECADRON) 4 MG tablet Take 1 tablet (4 mg total) by mouth 2 (two) times daily with a meal. 01/17/18  Yes Kyung Rudd, MD  fluconazole (DIFLUCAN) 100 MG tablet Take 1 tablet (100 mg total) by mouth daily. 01/29/18  Yes Curcio, Roselie Awkward, NP  LORazepam (ATIVAN) 0.5 MG tablet 1 tablet po 30 minutes prior to radiation or MRI, or every 4-6 hours prn anxiety 12/31/17  Yes Hayden Pedro, PA-C  oxyCODONE (OXY IR/ROXICODONE) 5 MG immediate release tablet Take 1 tablet (5 mg total) by mouth every 4 (four) hours as needed for severe pain. 01/07/18  Yes Hayden Pedro, PA-C  sertraline (ZOLOFT) 50 MG tablet Take 1/2 tab daily for 1 week and than full tab daily 01/30/18  Yes Arfeen, Arlyce Harman, MD  benzonatate (TESSALON) 100 MG capsule Take 1 capsule (100 mg total) by mouth 3 (three) times daily as needed for cough. Patient not taking: Reported on 02/12/2018  02/10/18   Harle Stanford., PA-C  nicotine (NICODERM CQ - DOSED IN MG/24 HOURS) 14 mg/24hr patch Place 1 patch (14 mg total) onto the skin daily. Patient not taking: Reported on 02/10/2018 12/13/17   Domenic Polite, MD  prochlorperazine (COMPAZINE) 10 MG tablet Take 1 tablet (10 mg total) by mouth every 6 (six) hours as needed for nausea or vomiting. Patient not taking: Reported on 02/12/2018 01/15/18   Maryanna Shape, NP    Physical Exam: Vitals:   02/12/18 1629 02/12/18 1906 02/12/18 1930 02/12/18 2000  BP:  114/76 124/86 113/78  Pulse:  (!) 114 (!) 113 (!) 111  Resp:  (!) 28 (!) 32 (!) 27  Temp:      TempSrc:      SpO2: 100% 91% 92% 93%  Weight:      Height:          Constitutional: Moderately built and nourished. Vitals:   02/12/18 1629 02/12/18 1906 02/12/18 1930 02/12/18 2000  BP:  114/76 124/86 113/78  Pulse:  (!) 114 (!) 113 (!) 111  Resp:  (!) 28 (!) 32 (!) 27  Temp:      TempSrc:      SpO2: 100% 91% 92% 93%  Weight:      Height:       Eyes: Anicteric no pallor. ENMT: No discharge from the ears eyes nose or mouth. Neck: No mass felt.  No neck rigidity. Respiratory: No rhonchi or crepitations. Cardiovascular: S1-S2 heard tachycardic. Abdomen: Soft nontender bowel sounds present. Musculoskeletal: No edema.  No joint effusion. Skin: No rash.  Skin appears warm. Neurologic: Alert awake oriented to person and place.  Moves all activities. Psychiatric: Mildly confused.   Labs on Admission: I have personally reviewed following labs and imaging studies  CBC: Recent Labs  Lab 02/10/18 1211 02/12/18 1659  WBC 3.2* 6.7  NEUTROABS 2.6 5.9  HGB 10.4* 10.9*  HCT 30.0* 31.1*  MCV 90.5 90.1  PLT 259 443   Basic Metabolic Panel: Recent Labs  Lab 02/10/18 1211 02/12/18 1659  NA 131* 140  K 3.6 3.8  CL 97* 99*  CO2 21* 23  GLUCOSE 124 109*  BUN 17 22*  CREATININE 0.70 0.73  CALCIUM 9.4 9.5   GFR: Estimated Creatinine Clearance: 58 mL/min (by C-G formula  based on SCr of 0.73 mg/dL). Liver Function Tests: Recent Labs  Lab 02/10/18 1211 02/12/18 1659  AST 25 85*  ALT 26 65*  ALKPHOS 74 101  BILITOT 0.3 1.0  PROT 6.8 7.9  ALBUMIN 2.3* 2.8*   No results for input(s): LIPASE, AMYLASE in the last 168 hours. No results for input(s): AMMONIA in the last 168 hours. Coagulation Profile: No results for input(s): INR, PROTIME in the last 168 hours. Cardiac Enzymes: No results for input(s): CKTOTAL, CKMB, CKMBINDEX, TROPONINI in the last 168 hours. BNP (last 3 results) No results for input(s): PROBNP in the last 8760 hours. HbA1C: No results for input(s): HGBA1C in the last 72 hours. CBG:  No results for input(s): GLUCAP in the last 168 hours. Lipid Profile: No results for input(s): CHOL, HDL, LDLCALC, TRIG, CHOLHDL, LDLDIRECT in the last 72 hours. Thyroid Function Tests: No results for input(s): TSH, T4TOTAL, FREET4, T3FREE, THYROIDAB in the last 72 hours. Anemia Panel: No results for input(s): VITAMINB12, FOLATE, FERRITIN, TIBC, IRON, RETICCTPCT in the last 72 hours. Urine analysis: No results found for: COLORURINE, APPEARANCEUR, LABSPEC, PHURINE, GLUCOSEU, HGBUR, BILIRUBINUR, KETONESUR, PROTEINUR, UROBILINOGEN, NITRITE, LEUKOCYTESUR Sepsis Labs: @LABRCNTIP (procalcitonin:4,lacticidven:4) )No results found for this or any previous visit (from the past 240 hour(s)).   Radiological Exams on Admission: Dg Chest Port 1 View  Result Date: 02/12/2018 CLINICAL DATA:  Weakness, shortness of Breath EXAM: PORTABLE CHEST 1 VIEW COMPARISON:  12/11/2017 FINDINGS: Diffuse interstitial prominence throughout the left lung. Right lung is clear. Heart is normal size. No effusions. No acute bony abnormality. IMPRESSION: Diffuse interstitial prominence throughout the left lung, likely pneumonia. Electronically Signed   By: Rolm Baptise M.D.   On: 02/12/2018 17:42    EKG: Independently reviewed.  Sinus tachycardia.  Assessment/Plan Principal Problem:    Sepsis ([place]) Active Problems:   Hypertension   Squamous cell carcinoma of right lung ([place])   HCAP (healthcare-associated pneumonia)    1. Sepsis with acute respiratory failure with hypoxia likely from healthcare associated pneumonia -patient is placed on vancomycin and cefepime.  Follow blood cultures sputum cultures influenza PCR urine for Legionella and strep antigen.  We will also check BNP. 2. Hypertension on amlodipine. 3. History of stage IV squamous cell lung cancer being followed by oncologist.  As per the patient's sister patient still takes Decadron which I have dose IV for now. 4. Anemia likely from chemotherapy -follow CBC. 5. Acute encephalopathy likely from sepsis and respiratory failure -closely observe. 6. History of depression recently started on Zoloft which patient has yet to start.   DVT prophylaxis: Lovenox. Code Status: Full code. Family Communication: Patient's sister. Disposition Plan: Home. Consults called: None. Admission status: Inpatient.   Rise Patience MD [facility] Pager [phone].  If 7PM-7AM, please contact night-coverage [email or web address] Password [facility]  02/12/2018, 8:42 PM

## 2018-02-13 ENCOUNTER — Other Ambulatory Visit: Payer: Self-pay

## 2018-02-13 DIAGNOSIS — I10 Essential (primary) hypertension: Secondary | ICD-10-CM

## 2018-02-13 LAB — LACTIC ACID, PLASMA: Lactic Acid, Venous: 1.2 mmol/L (ref 0.5–1.9)

## 2018-02-13 LAB — CBC
HEMATOCRIT: 26.7 % — AB (ref 36.0–46.0)
Hemoglobin: 9.1 g/dL — ABNORMAL LOW (ref 12.0–15.0)
MCH: 30.8 pg (ref 26.0–34.0)
MCHC: 34.1 g/dL (ref 30.0–36.0)
MCV: 90.5 fL (ref 78.0–100.0)
PLATELETS: 230 10*3/uL (ref 150–400)
RBC: 2.95 MIL/uL — AB (ref 3.87–5.11)
RDW: 15.3 % (ref 11.5–15.5)
WBC: 8.1 10*3/uL (ref 4.0–10.5)

## 2018-02-13 LAB — BASIC METABOLIC PANEL
Anion gap: 13 (ref 5–15)
BUN: 20 mg/dL (ref 6–20)
CHLORIDE: 102 mmol/L (ref 101–111)
CO2: 21 mmol/L — AB (ref 22–32)
CREATININE: 0.57 mg/dL (ref 0.44–1.00)
Calcium: 8.8 mg/dL — ABNORMAL LOW (ref 8.9–10.3)
GFR calc non Af Amer: >60 mL/min (ref >60)
Glucose, Bld: 159 mg/dL — ABNORMAL HIGH (ref 65–99)
POTASSIUM: 3.7 mmol/L (ref 3.5–5.1)
Sodium: 136 mmol/L (ref 135–145)

## 2018-02-13 LAB — MRSA PCR SCREENING: MRSA BY PCR: POSITIVE — AB

## 2018-02-13 LAB — BRAIN NATRIURETIC PEPTIDE: B NATRIURETIC PEPTIDE 5: 55 pg/mL (ref 0.0–100.0)

## 2018-02-13 LAB — INFLUENZA PANEL BY PCR (TYPE A & B)
Influenza A By PCR: NEGATIVE
Influenza B By PCR: NEGATIVE

## 2018-02-13 MED ORDER — IPRATROPIUM BROMIDE 0.02 % IN SOLN
0.5000 mg | Freq: Four times a day (QID) | RESPIRATORY_TRACT | Status: DC
  Administered 2018-02-13: 0.5 mg via RESPIRATORY_TRACT
  Filled 2018-02-13: qty 2.5

## 2018-02-13 MED ORDER — SALINE SPRAY 0.65 % NA SOLN
1.0000 | NASAL | Status: DC | PRN
  Filled 2018-02-13: qty 44

## 2018-02-13 MED ORDER — GUAIFENESIN-DM 100-10 MG/5ML PO SYRP
5.0000 mL | ORAL_SOLUTION | ORAL | Status: DC | PRN
  Administered 2018-02-13 – 2018-02-17 (×3): 5 mL via ORAL
  Filled 2018-02-13 (×4): qty 10

## 2018-02-13 MED ORDER — GUAIFENESIN ER 600 MG PO TB12
1200.0000 mg | ORAL_TABLET | Freq: Two times a day (BID) | ORAL | Status: DC
  Administered 2018-02-13 – 2018-02-26 (×27): 1200 mg via ORAL
  Filled 2018-02-13 (×27): qty 2

## 2018-02-13 MED ORDER — LEVALBUTEROL HCL 0.63 MG/3ML IN NEBU
0.6300 mg | INHALATION_SOLUTION | Freq: Two times a day (BID) | RESPIRATORY_TRACT | Status: DC
Start: 2018-02-13 — End: 2018-02-26
  Administered 2018-02-13 – 2018-02-26 (×24): 0.63 mg via RESPIRATORY_TRACT
  Filled 2018-02-13 (×24): qty 3

## 2018-02-13 MED ORDER — MUPIROCIN 2 % EX OINT
1.0000 "application " | TOPICAL_OINTMENT | Freq: Two times a day (BID) | CUTANEOUS | Status: AC
  Administered 2018-02-13 – 2018-02-18 (×10): 1 via NASAL
  Filled 2018-02-13: qty 22

## 2018-02-13 MED ORDER — CHLORHEXIDINE GLUCONATE CLOTH 2 % EX PADS
6.0000 | MEDICATED_PAD | Freq: Every day | CUTANEOUS | Status: AC
  Administered 2018-02-15 – 2018-02-17 (×3): 6 via TOPICAL

## 2018-02-13 MED ORDER — IPRATROPIUM BROMIDE 0.02 % IN SOLN
0.5000 mg | Freq: Two times a day (BID) | RESPIRATORY_TRACT | Status: DC
  Administered 2018-02-13 – 2018-02-26 (×24): 0.5 mg via RESPIRATORY_TRACT
  Filled 2018-02-13 (×24): qty 2.5

## 2018-02-13 MED ORDER — WHITE PETROLATUM EX OINT
TOPICAL_OINTMENT | CUTANEOUS | Status: DC | PRN
  Filled 2018-02-13: qty 5

## 2018-02-13 MED ORDER — ENSURE ENLIVE PO LIQD
237.0000 mL | Freq: Two times a day (BID) | ORAL | Status: DC
  Administered 2018-02-13 – 2018-02-25 (×17): 237 mL via ORAL

## 2018-02-13 MED ORDER — LORAZEPAM 0.5 MG PO TABS
0.5000 mg | ORAL_TABLET | Freq: Once | ORAL | Status: AC
  Administered 2018-02-13: 0.5 mg via ORAL
  Filled 2018-02-13: qty 1

## 2018-02-13 MED ORDER — LEVALBUTEROL HCL 0.63 MG/3ML IN NEBU
0.6300 mg | INHALATION_SOLUTION | Freq: Four times a day (QID) | RESPIRATORY_TRACT | Status: DC
  Administered 2018-02-13: 0.63 mg via RESPIRATORY_TRACT
  Filled 2018-02-13: qty 3

## 2018-02-13 NOTE — Progress Notes (Signed)
PROGRESS NOTE    Beth Alexander  OZH:[id number] DOB: Dec 04, 1950 DOA: 02/12/2018 PCP: [name], MD   Brief Narrative:  Beth Alexander is a 67 y.o. 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  Patient has been having symptoms of upper respiratory infection over the last 4 days and states her son also had similar infection.  Patient had gone to her oncologist 2 days ago and was placed on antibiotics.  Today patient sister came to pick her up for appointment when patient was found to be weak and confused and was having chills.  Patient was brought to the ER.  In the ED the patient was found to be tachycardic with hypoxia.  Initially requiring nonrebreather.  Chest x-ray shows features concerning for pneumonia patient was mildly febrile.  Blood cultures were obtained and placed on epic antibiotics for healthcare associated pneumonia. She was admitted to the Step Down unit and now has been weaned to 10 Liters of HFNC.   Assessment & Plan:   Principal Problem:   Sepsis ([place]) Active Problems:   Hypertension   Squamous cell carcinoma of right lung ([place])   HCAP (healthcare-associated pneumonia)  Acute Respiratory Failure with Hypoxia secondary to HCAP -Patient was initially placed on 100% non-rebreather -Continue supplemental oxygen and wean as tolerated; currently on 10 L high flow nasal cannula -Continuous pulse oximetry and maintain O2 saturations greater than 92% -Patient does not wear any home oxygen -ABG done on NRB showed 7.397/40.0/151/24.1/98.4% -CXR yesterday showed Asymmetric interstitial pattern throughout the left lung likely represents interstitial pneumonia or asymmetrical edema. No change. -Added Xopenex and Atrovent twice daily -Continue with antibiotic coverage with IV cefepime and IV vancomycin -Added  Guaifenesin 1200 mg p.o. twice daily -Added Flutter valve and Incentive spirometer -Repeat CXR in the AM.  Sepsis 2/2 to HCAP -Given IVF Rehydration wit 1750 mL on Admission -Sepsis physiology has been improved -Continue with antibiotic coverage with IV cefepime and IV vancomycin -Blood cultures x2 pending -Influenza a and B via PCR were negative.  Patient was PCR positive for MRSA -Strep urine Ag (Negative)  and Legionella antigens were being checked and Legionella still pending. -Lactic acid level on admission was 2.17 as well as procalcitonin was 2.78.  Lactic acid level is improved to 1.2 now -Continue to monitor follow cultures -Of note patient was placed on azithromycin as an outpatient by her oncology team for an upper respiratory tract infection  Lactic Acidosis, improved -Patient's lactic acid on admission was 2.17 improved to 1.2 -Given IV fluid hydration and improved.  Essential Hypertension -Continue with Amlodipine 5 mg p.o. daily  History of Stage IV Squamous Cell Lung Cancer test this is to the Brain and Kidneys -Being followed by Oncology Dr. Julien Nordmann.   -As per the patient's sister patient still takes Decadron and was changed to 4 mg IV every 12h -Recently had Chemotherapy currently on Carboplatin, Paclitaxel, Keytruda last treatment was 01/29/2018 with Cycle 1 Day 1  Acute Encephalopathy likely from Sepsis and Respiratory Failure -Improving.  Patient was more alert and awake. She is oriented x3  -Continue to Monitor extremely closely  History of Depression  -Recently started on Zoloft by her Psychiatrist which patient has yet to start.  Normocytic Anemia -Patient's Hb/Hct now 9.1/26.7 and likely multifactorial from illusional drop in chemotherapy induced -Check Anemia Panel -Continue to monitor  for signs and symptoms of bleeding -Repeat CBC in a.m.  Hyperglycemia -Likely Reactive -Check HbA1c  Abnormal LFT's -Admission  CMP showed she had an AST of 85 and  ALT of 65 -Continue to Monitor and Repeat CMP in AM -If continuing to trend up, will get an acute hepatitis panel as well as a right upper quadrant ultrasound  History of Tobacco Abuse -Counseling given.  DVT prophylaxis: Enoxaparin 40 mg subcu daily at bedtime Code Status: FULL CODE Family Communication: No family present at bedside. Disposition Plan: Remain inpatient for continued work-up and treatment of HCAP  Consultants:   None  Procedures:   None   Antimicrobials:  Anti-infectives (From admission, onward)   Start     Dose/Rate Route Frequency Ordered Stop   02/13/18 1000  vancomycin (VANCOCIN) IVPB 1000 mg/200 mL premix     1,000 mg 200 mL/hr over 60 Minutes Intravenous Every 24 hours 02/12/18 2051     02/13/18 0000  ceFEPIme (MAXIPIME) 1 g in sodium chloride 0.9 % 100 mL IVPB     1 g 200 mL/hr over 30 Minutes Intravenous Every 8 hours 02/12/18 2041 02/20/18 2159   02/12/18 1700  piperacillin-tazobactam (ZOSYN) IVPB 3.375 g     3.375 g 100 mL/hr over 30 Minutes Intravenous  Once 02/12/18 1647 02/12/18 1745   02/12/18 1700  vancomycin (VANCOCIN) IVPB 1000 mg/200 mL premix     1,000 mg 200 mL/hr over 60 Minutes Intravenous  Once 02/12/18 1647 02/12/18 1906     Subjective: Seen and xamined at bedside and stated that she is still short of breath but does not use any supplemental oxygen at home.  Was more awake alert and answers questions appropriately.  There are any chest pain at this time.  Knows that her sister brought her into the hospital because she was not feeling well.  Objective: Vitals:   02/13/18 0319 02/13/18 0400 02/13/18 0500 02/13/18 0600  BP:  129/85 124/77 109/81  Pulse:  (!) 125 (!) 116 (!) 114  Resp:  (!) 23 (!) 34 (!) 32  Temp: 97.9 F (36.6 C)     TempSrc: Oral     SpO2:  93% 97% 98%  Weight:      Height:        Intake/Output Summary (Last 24 hours) at 02/13/2018 0736 Last data filed at 02/13/2018 0200 Gross per 24 hour  Intake 2220 ml    Output -  Net 2220 ml   Filed Weights   02/12/18 1621  Weight: 55.8 kg (123 lb)   Examination: Physical Exam:  Constitutional: WN/WD AAF in NAD and appears calm Eyes: Lids and conjunctivae normal, sclerae anicteric  ENMT: External Ears, Nose appear normal. Grossly normal hearing. Neck: Appears normal, supple, no cervical masses, normal ROM, no appreciable thyromegaly; no JVD Respiratory: Diminished to auscultation bilaterally with mild rhonchi and slight wheezing. No appreciable crakles. Wearing 10 Liters HFNC and was somewhat tachypenic.  Cardiovascular: Tachycardic rate but regular rhythm, no murmurs / rubs / gallops. S1 and S2 auscultated. No extremity edema.  Abdomen: Soft, non-tender, non-distended. No masses palpated. Bowel sounds positive x4.  GU: Deferred. Musculoskeletal: No clubbing / cyanosis of digits/nails. No joint deformity upper and lower extremities. Skin: No rashes, lesions, ulcers on a limited skin evaluation. No induration; Warm and dry.  Neurologic: CN 2-12 grossly intact with no focal deficits. Romberg sign and cerebellar reflexes not assessed.  Psychiatric: Normal judgment and insight. Alert and oriented x 3. Depressed appearing mood and flat affect.  Data Reviewed: I have personally reviewed following labs and imaging studies  CBC: Recent Labs  Lab 02/10/18 1211 02/12/18 1659 02/12/18 2040 02/13/18 0322  WBC 3.2* 6.7 6.5 8.1  NEUTROABS 2.6 5.9  --   --   HGB 10.4* 10.9* 9.9* 9.1*  HCT 30.0* 31.1* 33.6* 26.7*  MCV 90.5 90.1 91.1 90.5  PLT 259 267 205 338   Basic Metabolic Panel: Recent Labs  Lab 02/10/18 1211 02/12/18 1659 02/12/18 2040 02/13/18 0322  NA 131* 140  --  136  K 3.6 3.8  --  3.7  CL 97* 99*  --  102  CO2 21* 23  --  21*  GLUCOSE 124 109*  --  159*  BUN 17 22*  --  20  CREATININE 0.70 0.73 0.70 0.57  CALCIUM 9.4 9.5  --  8.8*   GFR: Estimated Creatinine Clearance: 58 mL/min (by C-G formula based on SCr of 0.57  mg/dL). Liver Function Tests: Recent Labs  Lab 02/10/18 1211 02/12/18 1659  AST 25 85*  ALT 26 65*  ALKPHOS 74 101  BILITOT 0.3 1.0  PROT 6.8 7.9  ALBUMIN 2.3* 2.8*   No results for input(s): LIPASE, AMYLASE in the last 168 hours. No results for input(s): AMMONIA in the last 168 hours. Coagulation Profile: No results for input(s): INR, PROTIME in the last 168 hours. Cardiac Enzymes: No results for input(s): CKTOTAL, CKMB, CKMBINDEX, TROPONINI in the last 168 hours. BNP (last 3 results) No results for input(s): PROBNP in the last 8760 hours. HbA1C: No results for input(s): HGBA1C in the last 72 hours. CBG: No results for input(s): GLUCAP in the last 168 hours. Lipid Profile: No results for input(s): CHOL, HDL, LDLCALC, TRIG, CHOLHDL, LDLDIRECT in the last 72 hours. Thyroid Function Tests: No results for input(s): TSH, T4TOTAL, FREET4, T3FREE, THYROIDAB in the last 72 hours. Anemia Panel: No results for input(s): VITAMINB12, FOLATE, FERRITIN, TIBC, IRON, RETICCTPCT in the last 72 hours. Sepsis Labs: Recent Labs  Lab 02/12/18 1704 02/12/18 2040 02/12/18 2042 02/12/18 2342  PROCALCITON  --  2.78  --   --   LATICACIDVEN 2.17*  --  1.9 1.2    Recent Results (from the past 240 hour(s))  MRSA PCR Screening     Status: Abnormal   Collection Time: 02/12/18 11:42 PM  Result Value Ref Range Status   MRSA by PCR POSITIVE (A) NEGATIVE Final    Comment:        The GeneXpert MRSA Assay (FDA approved for NASAL specimens only), is one component of a comprehensive MRSA colonization surveillance program. It is not intended to diagnose MRSA infection nor to guide or monitor treatment for MRSA infections. RESULT CALLED TO, READ BACK BY AND VERIFIED WITH: Coletta Memos [id number] 02/13/18 MKELLY Performed at [facility], [place] [address] [place], [place] [address]     Radiology Studies: Ct Head Wo Contrast  Result Date: 02/12/2018 CLINICAL DATA:  Altered level of  consciousness, history of right thalamus brain metastasis, status post radiation EXAM: CT HEAD WITHOUT CONTRAST TECHNIQUE: Contiguous axial images were obtained from the base of the skull through the vertex without intravenous contrast. COMPARISON:  01/10/2018 FINDINGS: Brain: Stable 2 cm right thalamus hypodense lesion correlating with the known metastasis status post radiation. No acute intracranial hemorrhage, new infarction, midline shift, herniation, hydrocephalus, extra-axial fluid collection. No focal mass effect or edema. Cisterns are patent. No cerebellar abnormality. Vascular: No hyperdense vessel or unexpected calcification. Skull: Normal. Negative for fracture or focal lesion.  Sinuses/Orbits: No acute finding. Other: None. IMPRESSION: Stable 2 cm hypodense right thalamus lesion. No acute intracranial abnormality by noncontrast CT. Electronically Signed   By: Jerilynn Mages.  Shick M.D.   On: 02/12/2018 21:14   Dg Chest Port 1 View  Result Date: 02/12/2018 CLINICAL DATA:  Increasing shortness of breath. Low oxygen saturation. EXAM: PORTABLE CHEST 1 VIEW COMPARISON:  02/12/2018 FINDINGS: Shallow inspiration with elevation of the right hemidiaphragm. Mild linear atelectasis in the lung bases. There is asymmetrical interstitial infiltration throughout the left lung. This could represent asymmetrical edema or interstitial pneumonia. Similar appearance to previous study. No pleural effusions. No pneumothorax. Heart size and pulmonary vascularity are normal. IMPRESSION: Asymmetric interstitial pattern throughout the left lung likely represents interstitial pneumonia or asymmetrical edema. No change. Electronically Signed   By: Lucienne Capers M.D.   On: 02/12/2018 22:14   Dg Chest Port 1 View  Result Date: 02/12/2018 CLINICAL DATA:  Weakness, shortness of Breath EXAM: PORTABLE CHEST 1 VIEW COMPARISON:  12/11/2017 FINDINGS: Diffuse interstitial prominence throughout the left lung. Right lung is clear. Heart is  normal size. No effusions. No acute bony abnormality. IMPRESSION: Diffuse interstitial prominence throughout the left lung, likely pneumonia. Electronically Signed   By: Rolm Baptise M.D.   On: 02/12/2018 17:42   Scheduled Meds: . amLODipine  5 mg Oral Daily  . dexamethasone  4 mg Intravenous Q12H  . enoxaparin (LOVENOX) injection  40 mg Subcutaneous QHS  . feeding supplement (ENSURE ENLIVE)  237 mL Oral BID BM   Continuous Infusions: . ceFEPime (MAXIPIME) IV Stopped (02/13/18 4259)  . vancomycin      LOS: 1 day   Kerney Elbe, DO [facility] Pager [phone]  If 7PM-7AM, please contact night-coverage [email or web address] Password [facility] 02/13/2018, 7:36 AM

## 2018-02-13 NOTE — Progress Notes (Signed)
Pt complaining of burning to nares r/t oxygen therapy via HFNC. When [place] removed and pt is on room air, o2 sats drop into 80s immediately. Spoke with Agricultural consultant , RT and MD on call about options to provide some comfort to optimize pt's tolerance of o2. Unfortunately pt still complains of burning despite interventions. Pt placed on non-rebreahter at this time per her request. O2 sats 100%. Will continue to monitor.

## 2018-02-13 NOTE — Progress Notes (Signed)
Initial Nutrition Assessment  DOCUMENTATION CODES:   Not applicable  INTERVENTION:  - Continue Ensure Enlive po BID, each supplement provides 350 kcal and 20 grams of protein. - Continue to encourage PO intakes.  - Will attempt NFPE at follow-up.   NUTRITION DIAGNOSIS:   Increased nutrient needs related to catabolic illness, cancer and cancer related treatments as evidenced by estimated needs.  GOAL:   Patient will meet greater than or equal to 90% of their needs  MONITOR:   PO intake, Supplement acceptance, Weight trends, Labs  REASON FOR ASSESSMENT:   Malnutrition Screening Tool  ASSESSMENT:   67 y.o. 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  Patient has been having symptoms of upper respiratory infection over the last 4 days and states her son also had similar infection.  Patient had gone to her oncologist 2 days ago and was placed on antibiotics.  Today patient sister came to pick her up for appointment when patient was found to be weak and confused and was having chills.  Patient was brought to the ER.  BMI indicates normal weight. Pt very lethargic during RD visit and limited information was able to be obtained at this time. Pt was seen by [facility] RD on 4/10 and pt was scheduled for follow-up on 5/1. No family/visitors present. Pt indicates no taste changes since starting treatment, no current abdominal pain or nausea. She has a decreased appetite and was typically eating 2 meals/day; unable to obtain further information on this. She reports that at home she was drinking 1-2 Ensure/day over the past 2 weeks. No further information able to be obtained.   Per chart review, pt has lost 12 lbs (9% body weight) over the past 2 months. This is significant for time frame. Unable to determine if pt meets  criteria for malnutrition without NFPE. Will update documentation at follow-up if applicable.   Medications reviewed. Labs reviewed.      NUTRITION - FOCUSED PHYSICAL EXAM:  Unable to obtain consent from pt at this time; will attempt at follow-up.  Diet Order:  Diet Heart Room service appropriate? Yes; Fluid consistency: Thin  EDUCATION NEEDS:   No education needs have been identified at this time  Skin:  Skin Assessment: Reviewed RN Assessment  Last BM:  4/24  Height:   Ht Readings from Last 1 Encounters:  02/12/18 5\' 3"  (1.6 m)    Weight:   Wt Readings from Last 1 Encounters:  02/12/18 123 lb (55.8 kg)    Ideal Body Weight:  52.27 kg  BMI:  Body mass index is 21.79 kg/m.  Estimated Nutritional Needs:   Kcal:  1950-2120 (35-38 kcal/kg)  Protein:  85-95 grams (1.5-1.7 grams/kg)  Fluid:  >/= 2 L/day       Jarome Matin, MS, RD, LDN, [facility] Inpatient Clinical Dietitian Pager # [phone] After hours/weekend pager # [phone]

## 2018-02-14 ENCOUNTER — Inpatient Hospital Stay (HOSPITAL_COMMUNITY): Payer: Medicare HMO

## 2018-02-14 ENCOUNTER — Encounter (HOSPITAL_COMMUNITY): Payer: Self-pay | Admitting: Radiology

## 2018-02-14 DIAGNOSIS — I82402 Acute embolism and thrombosis of unspecified deep veins of left lower extremity: Secondary | ICD-10-CM

## 2018-02-14 DIAGNOSIS — M7989 Other specified soft tissue disorders: Secondary | ICD-10-CM

## 2018-02-14 LAB — CBC WITH DIFFERENTIAL/PLATELET
BASOS ABS: 0 10*3/uL (ref 0.0–0.1)
BASOS PCT: 0 %
Eosinophils Absolute: 0 10*3/uL (ref 0.0–0.7)
Eosinophils Relative: 0 %
HEMATOCRIT: 25.2 % — AB (ref 36.0–46.0)
Hemoglobin: 8.8 g/dL — ABNORMAL LOW (ref 12.0–15.0)
LYMPHS PCT: 4 %
Lymphs Abs: 0.4 10*3/uL — ABNORMAL LOW (ref 0.7–4.0)
MCH: 31.4 pg (ref 26.0–34.0)
MCHC: 34.9 g/dL (ref 30.0–36.0)
MCV: 90 fL (ref 78.0–100.0)
MONO ABS: 0.3 10*3/uL (ref 0.1–1.0)
Monocytes Relative: 3 %
NEUTROS ABS: 9 10*3/uL — AB (ref 1.7–7.7)
Neutrophils Relative %: 93 %
PLATELETS: 239 10*3/uL (ref 150–400)
RBC: 2.8 MIL/uL — AB (ref 3.87–5.11)
RDW: 14.7 % (ref 11.5–15.5)
WBC: 9.6 10*3/uL (ref 4.0–10.5)

## 2018-02-14 LAB — COMPREHENSIVE METABOLIC PANEL
ALBUMIN: 2.2 g/dL — AB (ref 3.5–5.0)
ALT: 90 U/L — AB (ref 14–54)
AST: 82 U/L — ABNORMAL HIGH (ref 15–41)
Alkaline Phosphatase: 97 U/L (ref 38–126)
Anion gap: 12 (ref 5–15)
BUN: 22 mg/dL — ABNORMAL HIGH (ref 6–20)
CHLORIDE: 102 mmol/L (ref 101–111)
CO2: 22 mmol/L (ref 22–32)
CREATININE: 0.55 mg/dL (ref 0.44–1.00)
Calcium: 9.3 mg/dL (ref 8.9–10.3)
GFR calc Af Amer: >60 mL/min (ref >60)
GFR calc non Af Amer: >60 mL/min (ref >60)
GLUCOSE: 145 mg/dL — AB (ref 65–99)
POTASSIUM: 4.2 mmol/L (ref 3.5–5.1)
Sodium: 136 mmol/L (ref 135–145)
Total Bilirubin: 0.7 mg/dL (ref 0.3–1.2)
Total Protein: 7 g/dL (ref 6.5–8.1)

## 2018-02-14 LAB — IRON AND TIBC
Iron: 44 ug/dL (ref 28–170)
SATURATION RATIOS: 25 % (ref 10.4–31.8)
TIBC: 174 ug/dL — AB (ref 250–450)
UIBC: 130 ug/dL

## 2018-02-14 LAB — LEGIONELLA PNEUMOPHILA SEROGP 1 UR AG: L. pneumophila Serogp 1 Ur Ag: NEGATIVE

## 2018-02-14 LAB — MAGNESIUM: Magnesium: 2.2 mg/dL (ref 1.7–2.4)

## 2018-02-14 LAB — PHOSPHORUS: Phosphorus: 3.3 mg/dL (ref 2.5–4.6)

## 2018-02-14 LAB — RETICULOCYTES
RBC.: 2.8 MIL/uL — ABNORMAL LOW (ref 3.87–5.11)
Retic Count, Absolute: 42 K/uL (ref 19.0–186.0)
Retic Ct Pct: 1.5 % (ref 0.4–3.1)

## 2018-02-14 LAB — VITAMIN B12: VITAMIN B 12: 1634 pg/mL — AB (ref 180–914)

## 2018-02-14 LAB — HEMOGLOBIN A1C
HEMOGLOBIN A1C: 6.7 % — AB (ref 4.8–5.6)
MEAN PLASMA GLUCOSE: 145.59 mg/dL

## 2018-02-14 LAB — FOLATE: FOLATE: 9.7 ng/mL (ref >5.9)

## 2018-02-14 LAB — FERRITIN: Ferritin: 1237 ng/mL — ABNORMAL HIGH (ref 11–307)

## 2018-02-14 LAB — PROCALCITONIN: PROCALCITONIN: 1.68 ng/mL

## 2018-02-14 MED ORDER — IOPAMIDOL (ISOVUE-370) INJECTION 76%
INTRAVENOUS | Status: AC
  Filled 2018-02-14: qty 100

## 2018-02-14 MED ORDER — ENOXAPARIN SODIUM 60 MG/0.6ML ~~LOC~~ SOLN
60.0000 mg | Freq: Two times a day (BID) | SUBCUTANEOUS | Status: DC
Start: 2018-02-14 — End: 2018-02-26
  Administered 2018-02-14 – 2018-02-26 (×25): 60 mg via SUBCUTANEOUS
  Filled 2018-02-14 (×27): qty 0.6

## 2018-02-14 MED ORDER — IOPAMIDOL (ISOVUE-370) INJECTION 76%
100.0000 mL | Freq: Once | INTRAVENOUS | Status: AC | PRN
  Administered 2018-02-14: 100 mL via INTRAVENOUS

## 2018-02-14 NOTE — Progress Notes (Signed)
PROGRESS NOTE    Beth Alexander  UXN:[id number] DOB: 01/03/1951 DOA: 02/12/2018 PCP: [name], MD   Brief Narrative:  Beth Alexander is a 67 y.o. 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  Patient has been having symptoms of upper respiratory infection over the last 4 days and states her son also had similar infection.  Patient had gone to her oncologist 2 days ago and was placed on antibiotics.  Today patient sister came to pick her up for appointment when patient was found to be weak and confused and was having chills.  Patient was brought to the ER.  In the ED the patient was found to be tachycardic with hypoxia.  Initially requiring nonrebreather.  Chest x-ray shows features concerning for pneumonia patient was mildly febrile.  Blood cultures were obtained and placed on epic antibiotics for healthcare associated pneumonia. She was admitted to the Step Down unit and now has been weaned to 10 Liters of HFNC.   She still remained dyspneic today however did not tolerate nasal cannula so she was placed on back on nonrebreather.  Upon further evaluation today her left leg was more swollen than right leg and there is concern for DVT so she was placed on empiric Lovenox and a ultrasound of the left leg was ordered and did confirm an acute deep vein thrombosis involving the external iliac, common femoral, peroneal, and intramuscular gastrocnemius, and soleal veins of the left lower extremity.  CT PE is also ordered showed no acute pulmonary emboli.  Assessment & Plan:   Principal Problem:   Sepsis ([place]) Active Problems:   Hypertension   Squamous cell carcinoma of right lung ([place])   HCAP (healthcare-associated pneumonia)  Acute Respiratory Failure with Hypoxia secondary to HCAP -Patient was initially placed on 100%  non-rebreather and was on 10 L high flow nasal cannula yesterday during the day but had to be placed back on NRB due to complaining of burning liters via oxygen therapy from high flow nasal cannula -Continue supplemental oxygen and wean as tolerated;  -Continuous pulse oximetry and maintain O2 saturations greater than 92% -Patient does not wear any home oxygen -CXR on Admission showed Asymmetric interstitial pattern throughout the left lung likely represents interstitial pneumonia or asymmetrical edema. No change. -Added Xopenex and Atrovent twice daily -Continue with antibiotic coverage with IV cefepime and IV vancomycin -C/w Guaifenesin 1200 mg p.o. twice daily -C/w Flutter valve and Incentive spirometer -Repeat CXR this AM showed increased diffuse left lung opacity that was concerning for worsening pneumonia less likely asymmetric edema. 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  There is also decreased size of right hilar mass with the new pain to the right upper lobe bronchus with reinflation right upper lobe.  Extensive infiltrates throughout the left lung mostly prominent in the left upper lobe -If not improving will obtain a pulmonary consult  Sepsis 2/2 to HCAP -Given IVF Rehydration wit 1750 mL on Admission -Sepsis physiology has been improved -Continue with antibiotic coverage with IV cefepime and IV vancomycin -Blood cultures x2 showed NGTD at 2 days  -Influenza a and B via PCR were negative.  Patient was PCR positive for MRSA -Strep  urine Ag and Legionella antigens Negative -Lactic acid level on admission was 2.17 as well as procalcitonin was 2.78.  Lactic acid level is improved to 1.2 now calcitonin is trended down to 1.68 -Continue to monitor follow cultures -Of note patient was placed on azithromycin as an outpatient by  her oncology team for an upper respiratory tract infection  Acute Left Leg DVT -Left Leg Severely swollen -Lower extremity duplex showed evidence of acute deep vein thrombosis involving external iliac, common femoral, peroneal, intramuscular gastrocnemius, soleal veins of the left lower extremity. -Anticoagulate with full dose Lovenox  Lactic Acidosis, improved -Patient's lactic acid on admission was 2.17 improved to 1.2 -Given IV fluid hydration and improved.  Essential Hypertension -Continue with Amlodipine 5 mg p.o. daily  History of Stage IV Squamous Cell Lung Cancer test this is to the Brain and Kidneys -Being followed by Oncology Dr. Julien Nordmann.   -As per the patient's sister patient still takes Decadron and was changed to 4 mg IV every 12h -Recently had Chemotherapy currently on Carboplatin, Paclitaxel, Keytruda last treatment was 01/29/2018 with Cycle 1 Day 1 -CT of the chest showed metastatic disease to the right kidney, right adrenal glands, and posterior right chest wall at the right lung base  Acute Encephalopathy likely from Sepsis and Respiratory Failure -Improved.  Patient was more alert and awake. She is oriented x3  -Continue to Monitor extremely closely  History of Depression  -Recently started on Zoloft by her Psychiatrist which patient has yet to start.  Normocytic Anemia -Patient's Hb/Hct now 8.8/25.2 and likely multifactorial from dilutional drop in chemotherapy induced -Checked Anemia Panel: Iron level 44, U IBC of 130, TIBC 174, saturation ratio of 25, ferritin level of 1237, folate level 9.7, vitamin B12 1,634 -Continue to monitor for signs and symptoms of bleeding -Repeat CBC in a.m.  Hyperglycemia/IFG -Likely Reactive -Checked HbA1c was 6.7 -If consistently elevated will place on sliding scale insulin on a sensitive scale  Abnormal LFT's -Likely in the setting of sepsis as well as hypovolemia -Admission CMP showed she had an AST of 85 and ALT of 65;  repeat today showed that she had an AST of 82, and ALT of 90. -Continue to Monitor and Repeat CMP in AM -We will obtain a right upper quadrant ultrasound as well as an acute hepatitis panel  History of Tobacco Abuse -Counseling given.  DVT prophylaxis: Enoxaparin 40 mg subcu daily at bedtime Code Status: FULL CODE Family Communication: No family present at bedside. Disposition Plan: Remain inpatient for continued work-up and treatment of HCAP and DVT  Consultants:   None  Procedures:   None   Antimicrobials:  Anti-infectives (From admission, onward)   Start     Dose/Rate Route Frequency Ordered Stop   02/13/18 1000  vancomycin (VANCOCIN) IVPB 1000 mg/200 mL premix     1,000 mg 200 mL/hr over 60 Minutes Intravenous Every 24 hours 02/12/18 2051     02/13/18 0000  ceFEPIme (MAXIPIME) 1 g in sodium chloride 0.9 % 100 mL IVPB     1 g 200 mL/hr over 30 Minutes Intravenous Every 8 hours 02/12/18 2041 02/20/18 2159   02/12/18 1700  piperacillin-tazobactam (ZOSYN) IVPB 3.375 g     3.375 g 100 mL/hr over 30 Minutes Intravenous  Once 02/12/18 1647 02/12/18 1745   02/12/18 1700  vancomycin (VANCOCIN) IVPB 1000 mg/200 mL premix     1,000 mg 200 mL/hr over 60 Minutes Intravenous  Once 02/12/18 1647 02/12/18 1906     Subjective: Seen  and examined at bedside and was awake and alert however she was not wanting to wear her nasal cannula as she stated to burn her nose.  She is placed on a nonrebreather and she is wearing it in her mouth however her oxygen saturations were in the 90s.  Denies any chest pain.  Patient's left leg was extremely swollen.   Objective: Vitals:   02/13/18 2337 02/14/18 0000 02/14/18 0400 02/14/18 0425  BP:  120/78 126/85   Pulse:  98 94   Resp:  (!) 31 (!) 28   Temp: (!) 97.3 F (36.3 C)   97.7 F (36.5 C)  TempSrc: Axillary   Axillary  SpO2:  99% 100%   Weight:    57.3 kg (126 lb 5.2 oz)  Height:        Intake/Output Summary (Last 24 hours) at 02/14/2018  1751 Last data filed at 02/13/2018 2229 Gross per 24 hour  Intake 500 ml  Output 200 ml  Net 300 ml   Filed Weights   02/12/18 1621 02/14/18 0425  Weight: 55.8 kg (123 lb) 57.3 kg (126 lb 5.2 oz)   Examination: Physical Exam:  Constitutional: Well-nourished, well-developed American female who is appears calm but seems uncomfortable Eyes: Sclera anicteric.  Lids and conjunctive are normal. ENMT: External ears and nose appear normal.  Grossly normal hearing Neck: Appears supple, no JVD Respiratory: Diminished to auscultation with some rhonchi and wheezing.  No appreciable crackers.  Wearing a nonrebreather however not wearing it properly as she is not able to tolerate 10 L high flow nasal cannula because of burning in her nose.  The tachypneic Cardiovascular: Regular rhythm.  No appreciable murmurs, rubs, gallops.  Has significant lower extremity swelling and some edema on the left compared to the right Abdomen: Soft, nontender, nondistended.  Bowel sounds present all 4 quadrants tachycardic rate GU: Deferred Musculoskeletal: No contractures, or clubbing.  No joint deformities in upper lower extremity's Skin: No appreciable rashes, lesions, ulcers on limited examination.  Warm and dry.  He has severe leg swelling on the left compared to the right Neurologic: Cranial nerves II through XII grossly intact with no appreciable focal deficits.  Romberg signs are well fixed not assessed Psychiatric: Normal judgment and insight.  Awake alert and oriented x3.  Depressed appearing mood and flat affect  Data Reviewed: I have personally reviewed following labs and imaging studies  CBC: Recent Labs  Lab 02/10/18 1211 02/12/18 1659 02/12/18 2040 02/13/18 0322 02/14/18 0343  WBC 3.2* 6.7 6.5 8.1 9.6  NEUTROABS 2.6 5.9  --   --  9.0*  HGB 10.4* 10.9* 9.9* 9.1* 8.8*  HCT 30.0* 31.1* 33.6* 26.7* 25.2*  MCV 90.5 90.1 91.1 90.5 90.0  PLT 259 267 205 230 025   Basic Metabolic Panel: Recent Labs    Lab 02/10/18 1211 02/12/18 1659 02/12/18 2040 02/13/18 0322 02/14/18 0343  NA 131* 140  --  136 136  K 3.6 3.8  --  3.7 4.2  CL 97* 99*  --  102 102  CO2 21* 23  --  21* 22  GLUCOSE 124 109*  --  159* 145*  BUN 17 22*  --  20 22*  CREATININE 0.70 0.73 0.70 0.57 0.55  CALCIUM 9.4 9.5  --  8.8* 9.3  MG  --   --   --   --  2.2  PHOS  --   --   --   --  3.3   GFR: Estimated Creatinine Clearance: 58  mL/min (by C-G formula based on SCr of 0.55 mg/dL). Liver Function Tests: Recent Labs  Lab 02/10/18 1211 02/12/18 1659 02/14/18 0343  AST 25 85* 82*  ALT 26 65* 90*  ALKPHOS 74 101 97  BILITOT 0.3 1.0 0.7  PROT 6.8 7.9 7.0  ALBUMIN 2.3* 2.8* 2.2*   No results for input(s): LIPASE, AMYLASE in the last 168 hours. No results for input(s): AMMONIA in the last 168 hours. Coagulation Profile: No results for input(s): INR, PROTIME in the last 168 hours. Cardiac Enzymes: No results for input(s): CKTOTAL, CKMB, CKMBINDEX, TROPONINI in the last 168 hours. BNP (last 3 results) No results for input(s): PROBNP in the last 8760 hours. HbA1C: No results for input(s): HGBA1C in the last 72 hours. CBG: No results for input(s): GLUCAP in the last 168 hours. Lipid Profile: No results for input(s): CHOL, HDL, LDLCALC, TRIG, CHOLHDL, LDLDIRECT in the last 72 hours. Thyroid Function Tests: No results for input(s): TSH, T4TOTAL, FREET4, T3FREE, THYROIDAB in the last 72 hours. Anemia Panel: No results for input(s): VITAMINB12, FOLATE, FERRITIN, TIBC, IRON, RETICCTPCT in the last 72 hours. Sepsis Labs: Recent Labs  Lab 02/12/18 1704 02/12/18 2040 02/12/18 2042 02/12/18 2342  PROCALCITON  --  2.78  --   --   LATICACIDVEN 2.17*  --  1.9 1.2    Recent Results (from the past 240 hour(s))  Blood Culture (routine x 2)     Status: None (Preliminary result)   Collection Time: 02/12/18  4:59 PM  Result Value Ref Range Status   Specimen Description   Final    BLOOD RIGHT ANTECUBITAL Performed  at [facility] [address], [place], [place] [address]    Special Requests   Final    BOTTLES DRAWN AEROBIC AND ANAEROBIC Blood Culture results may not be optimal due to an excessive volume of blood received in culture bottles Performed at [facility] [address], [place], [place] [address]    Culture   Final    NO GROWTH < 24 HOURS Performed at [facility] [address] [place], [place] [address]    Report Status PENDING  Incomplete  Blood Culture (routine x 2)     Status: None (Preliminary result)   Collection Time: 02/12/18  4:59 PM  Result Value Ref Range Status   Specimen Description   Final    BLOOD LEFT ANTECUBITAL Performed at [facility] [address], [place], [place] [address]    Special Requests   Final    BOTTLES DRAWN AEROBIC AND ANAEROBIC Blood Culture adequate volume Performed at [facility] [address], [place], [place] [address]    Culture   Final    NO GROWTH < 24 HOURS Performed at [facility] [address] [place], [place] [address]    Report Status PENDING  Incomplete  MRSA PCR Screening     Status: Abnormal   Collection Time: 02/12/18 11:42 PM  Result Value Ref Range Status   MRSA by PCR POSITIVE (A) NEGATIVE Final    Comment:        The GeneXpert MRSA Assay (FDA approved for NASAL specimens only), is one component of a comprehensive MRSA colonization surveillance program. It is not intended to diagnose MRSA infection nor to guide or monitor treatment for MRSA infections. RESULT CALLED TO, READ BACK BY AND VERIFIED WITH: Coletta Memos [id number] 02/13/18 MKELLY Performed at [facility], [address]  [place], [place] [address]     Radiology Studies: Ct Head Wo Contrast  Result Date: 02/12/2018 CLINICAL DATA:  Altered level of consciousness, history of right thalamus brain metastasis, status post radiation EXAM: CT HEAD  WITHOUT CONTRAST TECHNIQUE: Contiguous axial images were obtained from the base of the skull through the vertex without intravenous contrast. COMPARISON:  01/10/2018 FINDINGS: Brain: Stable 2 cm right thalamus hypodense lesion correlating with the known metastasis status post radiation. No acute intracranial hemorrhage, new infarction, midline shift, herniation, hydrocephalus, extra-axial fluid collection. No focal mass effect or edema. Cisterns are patent. No cerebellar abnormality. Vascular: No hyperdense vessel or unexpected calcification. Skull: Normal. Negative for fracture or focal lesion. Sinuses/Orbits: No acute finding. Other: None. IMPRESSION: Stable 2 cm hypodense right thalamus lesion. No acute intracranial abnormality by noncontrast CT. Electronically Signed   By: Jerilynn Mages.  Shick M.D.   On: 02/12/2018 21:14   Dg Chest Port 1 View  Result Date: 02/12/2018 CLINICAL DATA:  Increasing shortness of breath. Low oxygen saturation. EXAM: PORTABLE CHEST 1 VIEW COMPARISON:  02/12/2018 FINDINGS: Shallow inspiration with elevation of the right hemidiaphragm. Mild linear atelectasis in the lung bases. There is asymmetrical interstitial infiltration throughout the left lung. This could represent asymmetrical edema or interstitial pneumonia. Similar appearance to previous study. No pleural effusions. No pneumothorax. Heart size and pulmonary vascularity are normal. IMPRESSION: Asymmetric interstitial pattern throughout the left lung likely represents interstitial pneumonia or asymmetrical edema. No change. Electronically Signed   By: Lucienne Capers M.D.   On: 02/12/2018 22:14   Dg Chest Port 1 View  Result Date: 02/12/2018 CLINICAL DATA:  Weakness, shortness of Breath EXAM: PORTABLE CHEST 1 VIEW COMPARISON:  12/11/2017 FINDINGS: Diffuse interstitial prominence throughout the left lung. Right lung is clear. Heart is normal size. No effusions. No acute bony abnormality. IMPRESSION: Diffuse interstitial prominence  throughout the left lung, likely pneumonia. Electronically Signed   By: Rolm Baptise M.D.   On: 02/12/2018 17:42   Scheduled Meds: . amLODipine  5 mg Oral Daily  . Chlorhexidine Gluconate Cloth  6 each Topical Q0600  . dexamethasone  4 mg Intravenous Q12H  . enoxaparin (LOVENOX) injection  40 mg Subcutaneous QHS  . feeding supplement (ENSURE ENLIVE)  237 mL Oral BID BM  . guaiFENesin  1,200 mg Oral BID  . ipratropium  0.5 mg Nebulization BID  . levalbuterol  0.63 mg Nebulization BID  . mupirocin ointment  1 application Nasal BID   Continuous Infusions: . ceFEPime (MAXIPIME) IV 1 g (02/14/18 0549)  . vancomycin Stopped (02/13/18 1146)    LOS: 2 days   Kerney Elbe, DO [facility] Pager [phone]  If 7PM-7AM, please contact night-coverage [email or web address] Password [facility] 02/14/2018, 7:33 AM

## 2018-02-14 NOTE — Progress Notes (Signed)
Left lower extremity venous duplex has been completed. There is evidence of acute deep vein thrombosis involving the external iliac, common femoral, peroneal, and intramuscular gastrocnemius, and soleal veins of the left lower extremity. Results were given to the patient's nurse, Ronalee Belts.   02/14/18 9:53 AM Beth Alexander RVT

## 2018-02-14 NOTE — Progress Notes (Signed)
Pharmacy Antibiotic Note  Beth Alexander is a 67 y.o. female admitted on 02/12/2018 with pneumonia.  Pharmacy has been consulted for vancomycin dosing.  Cefepime ordered per MD  Today, 02/14/2018  WBC remains WNL  SCr remains low/WNL  Afebrile  Plan:  Continue Cefepime 1g IV q8h  Continue Vancomycin 1g IV q24h  Monitor renal function  Check vancomycin levels if remains on vancomycin > 3-4 days  F/u ability to de-escalate antibiotics  Height: 5\' 3"  (160 cm) Weight: 126 lb 5.2 oz (57.3 kg) IBW/kg (Calculated) : 52.4  Temp (24hrs), Avg:97.5 F (36.4 C), Min:97.3 F (36.3 C), Max:97.9 F (36.6 C)  Recent Labs  Lab 02/10/18 1211 02/12/18 1659 02/12/18 1704 02/12/18 2040 02/12/18 2042 02/12/18 2342 02/13/18 0322 02/14/18 0343  WBC 3.2* 6.7  --  6.5  --   --  8.1 9.6  CREATININE 0.70 0.73  --  0.70  --   --  0.57 0.55  LATICACIDVEN  --   --  2.17*  --  1.9 1.2  --   --     Estimated Creatinine Clearance: 58 mL/min (by C-G formula based on SCr of 0.55 mg/dL).    No Known Allergies  Antimicrobials this admission:  4/24 vanco >> 4/24 zosyn x1 4/24 cefepime >>  Dose adjustments this admission:   Microbiology results:  4/24 BCx: ngtd 4/24 strep Ag: negative 4/24 legionella Ag 4/24 Influenza: negative 4/24 Sputum:  4/24 MRSA PCR: positive  Thank you for allowing pharmacy to be a part of this patient's care.  Gretta Arab PharmD, BCPS Pager [phone] 02/14/2018 9:49 AM

## 2018-02-14 NOTE — Progress Notes (Signed)
ANTICOAGULATION CONSULT NOTE - Initial Consult  Pharmacy Consult for Lovenox Indication: r/o PE/DVT  No Known Allergies  Patient Measurements: Height: 5\' 3"  (160 cm) Weight: 126 lb 5.2 oz (57.3 kg) IBW/kg (Calculated) : 52.4  Vital Signs: Temp: 97.7 F (36.5 C) (04/26 0425) Temp Source: Axillary (04/26 0425) BP: 126/85 (04/26 0400) Pulse Rate: 94 (04/26 0400)  Labs: Recent Labs    02/12/18 2040 02/13/18 0322 02/14/18 0343  HGB 9.9* 9.1* 8.8*  HCT 33.6* 26.7* 25.2*  PLT 205 230 239  CREATININE 0.70 0.57 0.55    Estimated Creatinine Clearance: 58 mL/min (by C-G formula based on SCr of 0.55 mg/dL).   Medical History: Past Medical History:  Diagnosis Date  . Hypertension   . Tobacco use     Medications:  Scheduled:  . amLODipine  5 mg Oral Daily  . Chlorhexidine Gluconate Cloth  6 each Topical Q0600  . dexamethasone  4 mg Intravenous Q12H  . enoxaparin (LOVENOX) injection  60 mg Subcutaneous Q12H  . feeding supplement (ENSURE ENLIVE)  237 mL Oral BID BM  . guaiFENesin  1,200 mg Oral BID  . ipratropium  0.5 mg Nebulization BID  . levalbuterol  0.63 mg Nebulization BID  . mupirocin ointment  1 application Nasal BID   Infusions:  . ceFEPime (MAXIPIME) IV 1 g (02/14/18 0549)  . vancomycin Stopped (02/13/18 1146)    Assessment: 69 yoF admitted on 4/24 with AMS, chills, URI, suspected pneumonia.  PMH significant for metastatic lung cancer.  Today, on 4/26 she has suspected PE/DVT and pharmacy is consulted to dose Lovenox.  Lovenox 40 mg SQ sq24 for VTE prophylaxis started on admission.  Last dose given on 4/25 at ~2200. SCr 0.55 CBC: Hgb 8.8 is decreased (baseline ~ 10-11), Plt WNL. No bleeding reported.  Goal of Therapy:  Anti-Xa level 0.6-1 units/ml 4hrs after LMWH dose given Monitor platelets by anticoagulation protocol: Yes   Plan:  Lovenox 1 mg/kg SQ q12h Continue to monitor SCr, CBC   Gretta Arab PharmD, BCPS Pager [phone] 02/14/2018 9:03  AM

## 2018-02-15 ENCOUNTER — Inpatient Hospital Stay (HOSPITAL_COMMUNITY): Payer: Medicare HMO

## 2018-02-15 DIAGNOSIS — R0600 Dyspnea, unspecified: Secondary | ICD-10-CM

## 2018-02-15 LAB — HEPATITIS PANEL, ACUTE
HCV Ab: <0.1 {s_co_ratio} (ref 0.0–0.9)
Hep A IgM: NEGATIVE
Hep B C IgM: NEGATIVE
Hepatitis B Surface Ag: NEGATIVE

## 2018-02-15 LAB — CBC WITH DIFFERENTIAL/PLATELET
BASOS PCT: 0 %
Basophils Absolute: 0 10*3/uL (ref 0.0–0.1)
EOS PCT: 0 %
Eosinophils Absolute: 0 10*3/uL (ref 0.0–0.7)
HEMATOCRIT: 27.4 % — AB (ref 36.0–46.0)
Hemoglobin: 9.2 g/dL — ABNORMAL LOW (ref 12.0–15.0)
LYMPHS PCT: 4 %
Lymphs Abs: 0.4 10*3/uL — ABNORMAL LOW (ref 0.7–4.0)
MCH: 30.8 pg (ref 26.0–34.0)
MCHC: 33.6 g/dL (ref 30.0–36.0)
MCV: 91.6 fL (ref 78.0–100.0)
Monocytes Absolute: 0.3 10*3/uL (ref 0.1–1.0)
Monocytes Relative: 3 %
NEUTROS PCT: 93 %
Neutro Abs: 8.2 10*3/uL — ABNORMAL HIGH (ref 1.7–7.7)
Platelets: 284 10*3/uL (ref 150–400)
RBC: 2.99 MIL/uL — ABNORMAL LOW (ref 3.87–5.11)
RDW: 14.8 % (ref 11.5–15.5)
WBC: 8.9 10*3/uL (ref 4.0–10.5)

## 2018-02-15 LAB — COMPREHENSIVE METABOLIC PANEL
ALK PHOS: 91 U/L (ref 38–126)
ALT: 78 U/L — ABNORMAL HIGH (ref 14–54)
ANION GAP: 8 (ref 5–15)
AST: 52 U/L — ABNORMAL HIGH (ref 15–41)
Albumin: 2.1 g/dL — ABNORMAL LOW (ref 3.5–5.0)
BUN: 18 mg/dL (ref 6–20)
CO2: 26 mmol/L (ref 22–32)
Calcium: 9.3 mg/dL (ref 8.9–10.3)
Chloride: 103 mmol/L (ref 101–111)
Creatinine, Ser: 0.44 mg/dL (ref 0.44–1.00)
GFR calc non Af Amer: >60 mL/min (ref >60)
Glucose, Bld: 134 mg/dL — ABNORMAL HIGH (ref 65–99)
POTASSIUM: 4.6 mmol/L (ref 3.5–5.1)
Sodium: 137 mmol/L (ref 135–145)
TOTAL PROTEIN: 6.6 g/dL (ref 6.5–8.1)
Total Bilirubin: 0.5 mg/dL (ref 0.3–1.2)

## 2018-02-15 LAB — ECHOCARDIOGRAM COMPLETE
HEIGHTINCHES: 63 in
Weight: 2021.18 oz

## 2018-02-15 LAB — GLUCOSE, CAPILLARY
GLUCOSE-CAPILLARY: 104 mg/dL — AB (ref 65–99)
GLUCOSE-CAPILLARY: 148 mg/dL — AB (ref 65–99)
GLUCOSE-CAPILLARY: 127 mg/dL — AB (ref 65–99)
Glucose-Capillary: 178 mg/dL — ABNORMAL HIGH (ref 65–99)
Glucose-Capillary: 116 mg/dL — ABNORMAL HIGH (ref 65–99)

## 2018-02-15 LAB — PROCALCITONIN: Procalcitonin: 0.95 ng/mL

## 2018-02-15 LAB — PHOSPHORUS: PHOSPHORUS: 3.3 mg/dL (ref 2.5–4.6)

## 2018-02-15 LAB — MAGNESIUM: Magnesium: 2.1 mg/dL (ref 1.7–2.4)

## 2018-02-15 MED ORDER — DEXAMETHASONE 4 MG PO TABS
4.0000 mg | ORAL_TABLET | Freq: Two times a day (BID) | ORAL | Status: DC
  Administered 2018-02-15 – 2018-02-26 (×23): 4 mg via ORAL
  Filled 2018-02-15: qty 2
  Filled 2018-02-15: qty 1
  Filled 2018-02-15 (×2): qty 2
  Filled 2018-02-15 (×4): qty 1
  Filled 2018-02-15: qty 2
  Filled 2018-02-15 (×2): qty 1
  Filled 2018-02-15 (×3): qty 2
  Filled 2018-02-15: qty 1
  Filled 2018-02-15: qty 2
  Filled 2018-02-15: qty 1
  Filled 2018-02-15 (×3): qty 2
  Filled 2018-02-15 (×2): qty 1
  Filled 2018-02-15: qty 2

## 2018-02-15 MED ORDER — INSULIN ASPART 100 UNIT/ML ~~LOC~~ SOLN
0.0000 [IU] | Freq: Three times a day (TID) | SUBCUTANEOUS | Status: DC
  Administered 2018-02-15 – 2018-02-16 (×2): 1 [IU] via SUBCUTANEOUS
  Administered 2018-02-17 – 2018-02-19 (×3): 2 [IU] via SUBCUTANEOUS
  Administered 2018-02-19 – 2018-02-20 (×2): 3 [IU] via SUBCUTANEOUS
  Administered 2018-02-20: 2 [IU] via SUBCUTANEOUS
  Administered 2018-02-21: 3 [IU] via SUBCUTANEOUS
  Administered 2018-02-21: 2 [IU] via SUBCUTANEOUS
  Administered 2018-02-22: 3 [IU] via SUBCUTANEOUS
  Administered 2018-02-23: 2 [IU] via SUBCUTANEOUS
  Administered 2018-02-23: 1 [IU] via SUBCUTANEOUS
  Administered 2018-02-24 (×2): 2 [IU] via SUBCUTANEOUS
  Administered 2018-02-25: 1 [IU] via SUBCUTANEOUS
  Administered 2018-02-25: 2 [IU] via SUBCUTANEOUS
  Administered 2018-02-26: 3 [IU] via SUBCUTANEOUS

## 2018-02-15 MED ORDER — INSULIN ASPART 100 UNIT/ML ~~LOC~~ SOLN
0.0000 [IU] | Freq: Every day | SUBCUTANEOUS | Status: DC
  Administered 2018-02-16 – 2018-02-20 (×3): 2 [IU] via SUBCUTANEOUS

## 2018-02-15 MED ORDER — PANTOPRAZOLE SODIUM 40 MG PO TBEC
40.0000 mg | DELAYED_RELEASE_TABLET | Freq: Every day | ORAL | Status: DC
  Administered 2018-02-15 – 2018-02-26 (×12): 40 mg via ORAL
  Filled 2018-02-15 (×12): qty 1

## 2018-02-15 NOTE — Progress Notes (Signed)
PROGRESS NOTE    Beth Alexander  KGM:[id number] DOB: 10/14/1951 DOA: 02/12/2018 PCP: [name], MD   Brief Narrative:  Beth Alexander is a 67 y.o. 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  Patient has been having symptoms of upper respiratory infection over the last 4 days and states her son also had similar infection.  Patient had gone to her oncologist 2 days ago and was placed on antibiotics.  Today patient sister came to pick her up for appointment when patient was found to be weak and confused and was having chills.  Patient was brought to the ER.  In the ED the patient was found to be tachycardic with hypoxia.  Initially requiring nonrebreather.  Chest x-ray showed features concerning for pneumonia.  Blood cultures were obtained and placed on empiric antibiotics for healthcare associated pneumonia. She was admitted to the Step Down  Found to have a DVT Assessment & Plan:   Principal Problem:   Sepsis ([place]) Active Problems:   Hypertension   Squamous cell carcinoma of right lung ([place])   HCAP (healthcare-associated pneumonia)  Acute Respiratory Failure with Hypoxia secondary to HCAP -Patient was initially placed on 100% non-rebreather and now on back on 10 L HFNC and will attempt to wean  -Continue supplemental oxygen and wean as tolerated;  -Continuous pulse oximetry and maintain O2 saturations greater than 92% -Patient does not wear any home oxygen -CXR on Admission showed Asymmetric interstitial pattern throughout the left lung likely represents interstitial pneumonia or asymmetrical edema. No change. -C/w Xopenex/Atrovent combination twice daily -Continue with antibiotic coverage with IV Cefepime and IV Vancomycin -C/w Guaifenesin 1200 mg p.o. 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  There is also decreased size of right hilar mass with the new pain to the right upper lobe bronchus with reinflation right upper lobe.  Extensive infiltrates throughout the left lung mostly prominent in the left upper lobe -ECHOCardiogram done and showed EF of 55-60% with G1DD and PA pressure of 39 mmHg -If not improving will obtain a Pulmonary Consult  Sepsis 2/2 to HCAP -Given IVF Rehydration wit 1750 mL on Admission and has not further IV fluid hydration -Sepsis physiology has been improved -Continue with antibiotic coverage with IV cefepime and IV vancomycin -Blood cultures x2 showed NGTD at 3 days  -Influenza a and B via PCR were negative.  Patient was PCR positive for MRSA -Strep Urine Ag and Legionella antigens Negative -Lactic acid level on admission was 2.17 as well as procalcitonin was 2.78.  Lactic acid level is improved to 1.2 now Procalcitonin is trended down to 1.68 -Continue to monitor and  follow cultures -Of note patient was placed on azithromycin as an outpatient by her oncology team for an upper respiratory tract infection  Acute Left Leg DVT -Left Leg Severely swollen compared to the Right -Lower extremity duplex of the Left LE showed evidence of acute deep vein thrombosis involving external iliac, common femoral, peroneal, intramuscular gastrocnemius, soleal veins of the left lower extremity. -Anticoagulated with full dose Lovenox given patient has Cancer  Lactic Acidosis, improved -Patient's lactic acid on admission was 2.17 improved to 1.2 -Given IV fluid hydration and improved.  Essential  Hypertension -Continue with Amlodipine 5 mg p.o. daily  History of Stage IV Squamous Cell Lung Cancer test this is to the Brain and Kidneys -Being followed by Oncology Dr. Julien Nordmann.   -As per the patient's sister  patient still takes Decadron and was changed to 4 mg IV every 12h -Recently had Chemotherapy currently on Carboplatin, Paclitaxel, Keytruda last treatment was 01/29/2018 with Cycle 1 Day 1 -CT of the chest showed metastatic disease to the right kidney, right adrenal glands, and posterior right chest wall at the right lung base  Acute Encephalopathy likely from Sepsis and Respiratory Failure -Improved.  Patient was more alert and awake. She is oriented x3  -Continue to Monitor extremely closely  History of Depression  -Recently started on Zoloft by her Psychiatrist which patient has yet to start.  Normocytic Anemia -Patient's Hb/Hct is now 9.2/27.4 -Checked Anemia Panel: Iron level 44, U IBC of 130, TIBC 174, saturation ratio of 25, ferritin level of 1237, folate level 9.7, vitamin B12 1,634 -Continue to monitor for signs and symptoms of bleeding -Repeat CBC in AM.  Diabetes Mellitus Type 2 -Checked HbA1c was 6.7 -Will place on Sensitive Novolog SSI AC/HS -AM blood Sugars on CMP ranging from 116-159  Abnormal LFT's -Likely in the setting of sepsis as well as hypovolemia -Admission CMP showed she had an AST of 85 and ALT of 65; repeat today showed that she had an AST of 52, and ALT of 78. -Continue to Monitor and Repeat CMP in AM -RUQ U/S showed Mild increased echogenicity is identified consistent with fatty infiltration. No focal mass is seen. Portal vein is patent on color Doppler imaging with normal direction of blood flow towards the liver. There was also gallbaldder sludge noted -Acute Hepatitis panel Negative -Continue to Monitor and Repeat CMP in AM  History of Tobacco Abuse -Counseling given.  DVT prophylaxis: Enoxaparin 1 mg/kg q12h Code Status: FULL CODE Family Communication: No family present at bedside. Disposition Plan: Remain inpatient for continued work-up and treatment of HCAP and DVT  Consultants:   None  Procedures:    ECHOCARDIOGRAM ------------------------------------------------------------------- Study Conclusions  - Left ventricle: The cavity size was normal. There was mild   concentric hypertrophy. Systolic function was normal. The   estimated ejection fraction was in the range of 55% to 60%. Wall   motion was normal; there were no regional wall motion   abnormalities. Doppler parameters are consistent with abnormal   left ventricular relaxation (grade 1 diastolic dysfunction).   There was no evidence of elevated ventricular filling pressure by   Doppler parameters. - Aortic root: The aortic root was normal in size. - Mitral valve: There was trivial regurgitation. - Right ventricle: Systolic function was normal. - Right atrium: The atrium was normal in size. - Tricuspid valve: There was mild regurgitation. - Pulmonic valve: There was trivial regurgitation. - Pulmonary arteries: Systolic pressure was mildly increased. PA   peak pressure: 39 mm Hg (S). - Inferior vena cava: The vessel was normal in size. - Pericardium, extracardiac: There was no pericardial effusion.   Antimicrobials:  Anti-infectives (From admission, onward)   Start     Dose/Rate Route Frequency Ordered Stop   02/13/18 1000  vancomycin (VANCOCIN) IVPB 1000 mg/200 mL premix     1,000 mg 200 mL/hr over 60 Minutes Intravenous Every 24 hours 02/12/18 2051     02/13/18 0000  ceFEPIme (MAXIPIME) 1 g in sodium chloride 0.9 % 100 mL IVPB     1 g 200 mL/hr over  30 Minutes Intravenous Every 8 hours 02/12/18 2041 02/20/18 2159   02/12/18 1700  piperacillin-tazobactam (ZOSYN) IVPB 3.375 g     3.375 g 100 mL/hr over 30 Minutes Intravenous  Once 02/12/18 1647 02/12/18 1745   02/12/18 1700  vancomycin (VANCOCIN) IVPB 1000 mg/200 mL premix     1,000 mg 200 mL/hr over 60 Minutes Intravenous  Once 02/12/18 1647 02/12/18 1906     Subjective: Seen and examined at bedside with more awake and alert.  States that she remains short of  breath and does not have home oxygen.  Is asking about how she got her DVT.  No lightheadedness, dizziness, nausea, vomiting.  Denied any chest pain.  Was eating breakfast had no other concerns or complaints at this time.  Objective: Vitals:   02/15/18 0100 02/15/18 0200 02/15/18 0325 02/15/18 0400  BP:  (!) 145/82  (!) 148/87  Pulse: 84 89  80  Resp: 19 (!) 21  20  Temp:   (!) 96.8 F (36 C)   TempSrc:   Axillary   SpO2: 100% 98%  100%  Weight:      Height:        Intake/Output Summary (Last 24 hours) at 02/15/2018 0732 Last data filed at 02/15/2018 0535 Gross per 24 hour  Intake 800 ml  Output 700 ml  Net 100 ml   Filed Weights   02/12/18 1621 02/14/18 0425  Weight: 55.8 kg (123 lb) 57.3 kg (126 lb 5.2 oz)   Examination: Physical Exam:  Constitutional: Well-nourished, well-developed Afro-American female who appears calm and more comfortable today. Eyes: Sclera are anicteric.  Lids and conjunctive are normal. ENMT: External ears and nose appear normal.  Mucous membranes appear moist Neck: Supple with no JVD Respiratory: Diminished to auscultation with some wheezing and rhonchi.  No appreciable crackles.  Wearing high flow nasal cannula and has slight tachypnea. Cardiovascular: Regular rate and rhythm.  No appreciable murmurs, rubs, gallops.  Has significant left lower extremity edema compared to the right is 1+ pitting Abdomen: Soft, nontender, nondistended.  Bowel sounds present all 4 quadrants GU: Deferred Musculoskeletal: No contractures, or cyanosis.  No joint deformities noted Skin: Warm and dry no rashes, lesions, ulcers are limited skin evaluation.  Left leg is severely swollen compared to the right Neurologic: Cranial nerves II through XII grossly intact with no appreciable focal deficits. Psychiatric: Intact judgment and insight.  Awake and alert and oriented x3.  Depressed appearing mood and somewhat of a flat affect  Data Reviewed: I have personally reviewed  following labs and imaging studies  CBC: Recent Labs  Lab 02/10/18 1211 02/12/18 1659 02/12/18 2040 02/13/18 0322 02/14/18 0343 02/15/18 0316  WBC 3.2* 6.7 6.5 8.1 9.6 8.9  NEUTROABS 2.6 5.9  --   --  9.0* 8.2*  HGB 10.4* 10.9* 9.9* 9.1* 8.8* 9.2*  HCT 30.0* 31.1* 33.6* 26.7* 25.2* 27.4*  MCV 90.5 90.1 91.1 90.5 90.0 91.6  PLT 259 267 205 230 239 389   Basic Metabolic Panel: Recent Labs  Lab 02/10/18 1211 02/12/18 1659 02/12/18 2040 02/13/18 0322 02/14/18 0343 02/15/18 0316  NA 131* 140  --  136 136 137  K 3.6 3.8  --  3.7 4.2 4.6  CL 97* 99*  --  102 102 103  CO2 21* 23  --  21* 22 26  GLUCOSE 124 109*  --  159* 145* 134*  BUN 17 22*  --  20 22* 18  CREATININE 0.70 0.73 0.70 0.57 0.55 0.44  CALCIUM 9.4 9.5  --  8.8* 9.3 9.3  MG  --   --   --   --  2.2 2.1  PHOS  --   --   --   --  3.3 3.3   GFR: Estimated Creatinine Clearance: 58 mL/min (by C-G formula based on SCr of 0.44 mg/dL). Liver Function Tests: Recent Labs  Lab 02/10/18 1211 02/12/18 1659 02/14/18 0343 02/15/18 0316  AST 25 85* 82* 52*  ALT 26 65* 90* 78*  ALKPHOS 74 101 97 91  BILITOT 0.3 1.0 0.7 0.5  PROT 6.8 7.9 7.0 6.6  ALBUMIN 2.3* 2.8* 2.2* 2.1*   No results for input(s): LIPASE, AMYLASE in the last 168 hours. No results for input(s): AMMONIA in the last 168 hours. Coagulation Profile: No results for input(s): INR, PROTIME in the last 168 hours. Cardiac Enzymes: No results for input(s): CKTOTAL, CKMB, CKMBINDEX, TROPONINI in the last 168 hours. BNP (last 3 results) No results for input(s): PROBNP in the last 8760 hours. HbA1C: Recent Labs    02/14/18 0807  HGBA1C 6.7*   CBG: No results for input(s): GLUCAP in the last 168 hours. Lipid Profile: No results for input(s): CHOL, HDL, LDLCALC, TRIG, CHOLHDL, LDLDIRECT in the last 72 hours. Thyroid Function Tests: No results for input(s): TSH, T4TOTAL, FREET4, T3FREE, THYROIDAB in the last 72 hours. Anemia Panel: Recent Labs     02/14/18 0807  VITAMINB12 1,634*  FOLATE 9.7  FERRITIN 1,237*  TIBC 174*  IRON 44  RETICCTPCT 1.5   Sepsis Labs: Recent Labs  Lab 02/12/18 1704 02/12/18 2040 02/12/18 2042 02/12/18 2342 02/14/18 0343 02/15/18 0316  PROCALCITON  --  2.78  --   --  1.68 0.95  LATICACIDVEN 2.17*  --  1.9 1.2  --   --     Recent Results (from the past 240 hour(s))  Blood Culture (routine x 2)     Status: None (Preliminary result)   Collection Time: 02/12/18  4:59 PM  Result Value Ref Range Status   Specimen Description   Final    BLOOD RIGHT ANTECUBITAL Performed at [facility] [address], [place], [place] [address]    Special Requests   Final    BOTTLES DRAWN AEROBIC AND ANAEROBIC Blood Culture results may not be optimal due to an excessive volume of blood received in culture bottles Performed at [facility] [address] [place], [place] [address]    Culture   Final    NO GROWTH 2 DAYS Performed at [facility] [address], [place], [place] [address]    Report Status PENDING  Incomplete  Blood Culture (routine x 2)     Status: None (Preliminary result)   Collection Time: 02/12/18  4:59 PM  Result Value Ref Range Status   Specimen Description   Final    BLOOD LEFT ANTECUBITAL Performed at [facility] [address], [place], [place] [address]    Special Requests   Final    BOTTLES DRAWN AEROBIC AND ANAEROBIC Blood Culture adequate volume Performed at [facility] [address] [place], [place] [address]    Culture   Final    NO GROWTH 2 DAYS Performed at [facility] [address], [place], [place] [address]    Report Status PENDING  Incomplete  MRSA PCR Screening     Status: Abnormal   Collection Time: 02/12/18 11:42 PM  Result Value Ref Range Status  MRSA by PCR POSITIVE (A) NEGATIVE Final    Comment:        The GeneXpert MRSA Assay (FDA approved for NASAL  specimens only), is one component of a comprehensive MRSA colonization surveillance program. It is not intended to diagnose MRSA infection nor to guide or monitor treatment for MRSA infections. RESULT CALLED TO, READ BACK BY AND VERIFIED WITH: Coletta Memos [id number] 02/13/18 MKELLY Performed at [facility], [place] [address] [place], [place] [address]     Radiology Studies: Ct Angio Chest Pe W Or Wo Contrast  Result Date: 02/14/2018 CLINICAL DATA:  Shortness of breath.  Metastatic lung cancer. EXAM: CT ANGIOGRAPHY CHEST WITH CONTRAST TECHNIQUE: Multidetector CT imaging of the chest was performed using the standard protocol during bolus administration of intravenous contrast. Multiplanar CT image reconstructions and MIPs were obtained to evaluate the vascular anatomy. CONTRAST:  141mL ISOVUE-370 IOPAMIDOL (ISOVUE-370) INJECTION 76% COMPARISON:  Chest x-ray 02/14/2018 and chest CT dated 12/10/2017 FINDINGS: Cardiovascular: There is complete chronic occlusion of the pulmonary artery to the right upper lobe, unchanged since the prior CT scan of 12/10/2017. No discrete pulmonary emboli. Aortic atherosclerosis. Mediastinum/Nodes: Right hilar mass extending into the mediastinum is significantly smaller than on the prior study. Right upper lobe bronchus is now patent and the right upper lobe has reinflated. Lungs/Pleura: Extensive infiltrate throughout the left lung, most extensive in left upper lobe. Extensive emphysematous changes in the left lung. Pleural based soft tissue mass posteriorly at the right lung base best seen on image 60 of series 4 consistent with metastatic disease. This is slightly smaller than on the prior study. Upper Abdomen: Metastatic lesion in the upper pole of the right kidney. Nodularity of the right adrenal gland probably represents metastatic disease. Musculoskeletal: No chest wall abnormality. No acute or significant osseous findings. Review of the MIP images  confirms the above findings. IMPRESSION: 1. No acute pulmonary emboli. 2. 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 3. Decreased size of right hilar mass with new patency of the right upper lobe bronchus with reinflation of the right upper lobe. 4. Extensive infiltrates throughout the left lung, most prominent in the right upper lobe. 5. Aortic Atherosclerosis (ICD10-I70.0) and Emphysema (ICD10-J43.9). 6. Metastatic disease to the right kidney, right adrenal glands, and to the posterior right chest wall at the right lung base. Electronically Signed   By: Lorriane Shire M.D.   On: 02/14/2018 13:21   Dg Chest Port 1 View  Result Date: 02/14/2018 CLINICAL DATA:  Shortness of breath. EXAM: PORTABLE CHEST 1 VIEW COMPARISON:  Radiograph of February 12, 2018. FINDINGS: Stable cardiomediastinal silhouette. No pneumothorax is noted. Right lung is clear. Increased diffuse left lung opacity is noted concerning for worsening pneumonia or less likely asymmetric edema. No significant pleural effusion is noted. Bony thorax is unremarkable. IMPRESSION: Increased diffuse left lung opacity is noted concerning for worsening pneumonia or less likely asymmetric edema. Electronically Signed   By: Marijo Conception, M.D.   On: 02/14/2018 08:52   US Abdomen Limited Ruq  Result Date: 02/14/2018 CLINICAL DATA:  Abnormal LFTs EXAM: ULTRASOUND ABDOMEN LIMITED RIGHT UPPER QUADRANT COMPARISON:  None. FINDINGS: Gallbladder: Gallbladder is well distended. Mild gallbladder sludge is noted within. No gallstones are seen. No wall thickening is noted. Common bile duct: Diameter: 1.9 mm Liver: Mild increased echogenicity is identified consistent with fatty infiltration. No focal mass is seen. Portal vein is patent on color Doppler imaging with normal direction of blood  flow towards the liver. IMPRESSION: Gallbladder sludge. Fatty liver. Electronically Signed   By: Inez Catalina M.D.   On: 02/14/2018 10:38    Scheduled Meds: . amLODipine  5 mg Oral Daily  . Chlorhexidine Gluconate Cloth  6 each Topical Q0600  . dexamethasone  4 mg Intravenous Q12H  . enoxaparin (LOVENOX) injection  60 mg Subcutaneous Q12H  . feeding supplement (ENSURE ENLIVE)  237 mL Oral BID BM  . guaiFENesin  1,200 mg Oral BID  . ipratropium  0.5 mg Nebulization BID  . levalbuterol  0.63 mg Nebulization BID  . mupirocin ointment  1 application Nasal BID   Continuous Infusions: . ceFEPime (MAXIPIME) IV Stopped (02/15/18 9357)  . vancomycin Stopped (02/14/18 1300)    LOS: 3 days   Kerney Elbe, DO [facility] Pager [phone]  If 7PM-7AM, please contact night-coverage [email or web address] Password [facility] 02/15/2018, 7:32 AM

## 2018-02-15 NOTE — Progress Notes (Signed)
  Echocardiogram 2D Echocardiogram has been performed.  Beth Alexander F 02/15/2018, 10:47 AM

## 2018-02-16 ENCOUNTER — Inpatient Hospital Stay (HOSPITAL_COMMUNITY): Payer: Medicare HMO

## 2018-02-16 DIAGNOSIS — L8990 Pressure ulcer of unspecified site, unspecified stage: Secondary | ICD-10-CM

## 2018-02-16 LAB — CBC WITH DIFFERENTIAL/PLATELET
BAND NEUTROPHILS: 5 %
BASOS PCT: 0 %
Basophils Absolute: 0 10*3/uL (ref 0.0–0.1)
Blasts: 0 %
EOS ABS: 0 10*3/uL (ref 0.0–0.7)
Eosinophils Relative: 0 %
HCT: 26.9 % — ABNORMAL LOW (ref 36.0–46.0)
Hemoglobin: 9.2 g/dL — ABNORMAL LOW (ref 12.0–15.0)
LYMPHS ABS: 0.8 10*3/uL (ref 0.7–4.0)
Lymphocytes Relative: 8 %
MCH: 31 pg (ref 26.0–34.0)
MCHC: 34.2 g/dL (ref 30.0–36.0)
MCV: 90.6 fL (ref 78.0–100.0)
MONO ABS: 0.1 10*3/uL (ref 0.1–1.0)
MYELOCYTES: 2 %
Metamyelocytes Relative: 4 %
Monocytes Relative: 1 %
Neutro Abs: 8.9 10*3/uL — ABNORMAL HIGH (ref 1.7–7.7)
Neutrophils Relative %: 80 %
OTHER: 0 %
PLATELETS: 340 10*3/uL (ref 150–400)
PROMYELOCYTES RELATIVE: 0 %
RBC: 2.97 MIL/uL — ABNORMAL LOW (ref 3.87–5.11)
RDW: 14.8 % (ref 11.5–15.5)
WBC: 9.8 10*3/uL (ref 4.0–10.5)
nRBC: 0 /100 WBC

## 2018-02-16 LAB — PROCALCITONIN: Procalcitonin: 0.51 ng/mL

## 2018-02-16 LAB — PHOSPHORUS: PHOSPHORUS: 3.1 mg/dL (ref 2.5–4.6)

## 2018-02-16 LAB — COMPREHENSIVE METABOLIC PANEL
ALT: 65 U/L — AB (ref 14–54)
ANION GAP: 12 (ref 5–15)
AST: 36 U/L (ref 15–41)
Albumin: 2.1 g/dL — ABNORMAL LOW (ref 3.5–5.0)
Alkaline Phosphatase: 91 U/L (ref 38–126)
BUN: 15 mg/dL (ref 6–20)
CHLORIDE: 101 mmol/L (ref 101–111)
CO2: 24 mmol/L (ref 22–32)
CREATININE: 0.42 mg/dL — AB (ref 0.44–1.00)
Calcium: 9.1 mg/dL (ref 8.9–10.3)
GFR calc non Af Amer: >60 mL/min (ref >60)
Glucose, Bld: 119 mg/dL — ABNORMAL HIGH (ref 65–99)
POTASSIUM: 4.2 mmol/L (ref 3.5–5.1)
SODIUM: 137 mmol/L (ref 135–145)
Total Bilirubin: 0.4 mg/dL (ref 0.3–1.2)
Total Protein: 6.1 g/dL — ABNORMAL LOW (ref 6.5–8.1)

## 2018-02-16 LAB — GLUCOSE, CAPILLARY
GLUCOSE-CAPILLARY: 86 mg/dL (ref 65–99)
GLUCOSE-CAPILLARY: 91 mg/dL (ref 65–99)
Glucose-Capillary: 145 mg/dL — ABNORMAL HIGH (ref 65–99)
Glucose-Capillary: 235 mg/dL — ABNORMAL HIGH (ref 65–99)

## 2018-02-16 LAB — MAGNESIUM: MAGNESIUM: 1.9 mg/dL (ref 1.7–2.4)

## 2018-02-16 MED ORDER — FUROSEMIDE 10 MG/ML IJ SOLN
40.0000 mg | Freq: Once | INTRAMUSCULAR | Status: AC
  Administered 2018-02-16: 40 mg via INTRAVENOUS
  Filled 2018-02-16: qty 4

## 2018-02-16 MED ORDER — ARFORMOTEROL TARTRATE 15 MCG/2ML IN NEBU
15.0000 ug | INHALATION_SOLUTION | Freq: Two times a day (BID) | RESPIRATORY_TRACT | Status: DC
  Administered 2018-02-16 – 2018-02-26 (×19): 15 ug via RESPIRATORY_TRACT
  Filled 2018-02-16 (×18): qty 2

## 2018-02-16 NOTE — Progress Notes (Signed)
PROGRESS NOTE    Beth Alexander  YQI:[id number] DOB: 1951/04/29 DOA: 02/12/2018 PCP: [name], MD   Brief Narrative:  Beth Alexander is a 67 y.o. 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  Patient has been having symptoms of upper respiratory infection over the last 4 days and states her son also had similar infection.  Patient had gone to her oncologist 2 days ago and was placed on antibiotics.  Today patient sister came to pick her up for appointment when patient was found to be weak and confused and was having chills.  Patient was brought to the ER.  In the ED the patient was found to be tachycardic with hypoxia.  Initially requiring nonrebreather.  Chest x-ray showed features concerning for pneumonia.  Blood cultures were obtained and placed on empiric antibiotics for healthcare associated pneumonia. She was admitted to the Step Down Unit and now has been weaned to 8 Liters HFNC. Found to have an DVT and placed on Full dose Lovenox due to Cancer.   Assessment & Plan:   Principal Problem:   Sepsis ([place]) Active Problems:   Hypertension   Squamous cell carcinoma of right lung ([place])   HCAP (healthcare-associated pneumonia)  Acute Respiratory Failure with Hypoxia secondary to HCAP -Patient was initially placed on 100% non-rebreather and now on 8L HFNC and attempt to wean further oxygen requirement -Continue supplemental oxygen and wean as tolerated;  -Continuous pulse oximetry and maintain O2 saturations greater than 92% -Patient does not wear any home oxygen -CXR on Admission showed Asymmetric interstitial pattern throughout the left lung likely represents interstitial pneumonia or asymmetrical edema. No change. -C/w Xopenex/Atrovent combination twice daily -Continue with antibiotic coverage with IV Cefepime  and IV Vancomycin -C/w Guaifenesin 1200 mg p.o. 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  There is also decreased size of right hilar mass with the new pain to the right upper lobe bronchus with reinflation right upper lobe.  Extensive infiltrates throughout the left lung mostly prominent in the left upper lobe -ECHOCardiogram done and showed EF of 55-60% with G1DD and PA pressure of 39 mmHg -CXR this AM showed minimal improvement of the diffuse left infiltrate.  Mild patchy opacity in the right lobe stable and no other changes. -We will try IV Lasix 40 mg times once -We will also add arformoterol nebulizer solution 15 mcg twice daily -If not improving will obtain a Pulmonary Consult and will discuss with PCCM in AM  Sepsis 2/2 to HCAP -Given IVF Rehydration wit 1750 mL on Admission and has not further IV fluid hydration -Sepsis physiology has been improved -Continue with antibiotic coverage with IV cefepime and IV vancomycin -Chest x-ray showed minimal improvement of the diffuse left infiltrate.  Mild patchy opacity in the right lung is stable and no other changes. -Blood cultures x2 showed NGTD at 3 days  -Influenza a and B via PCR were negative.  Patient was PCR positive for MRSA -Strep Urine Ag and Legionella antigens Negative -Lactic acid level on admission was 2.17 as well as procalcitonin was 2.78.  Lactic acid level is improved to 1.2 now Procalcitonin is trended down to 0.51 -Continue to Monitor and  follow cultures -  Of note patient was placed on azithromycin as an outpatient by her Oncology team for an upper respiratory tract infection  Acute Left Leg DVT -Left Leg Severely swollen compared to the Right -Lower Extremity Duplex of the Left LE showed evidence of acute deep vein thrombosis involving external iliac,  common femoral, peroneal, intramuscular gastrocnemius, soleal veins of the left lower extremity. -Anticoagulated with full dose Lovenox given patient has Cancer  Lactic Acidosis, improved -Patient's lactic acid on admission was 2.17 improved to 1.2 -Given IV fluid hydration and improved.  Essential Hypertension -Continue with Amlodipine 5 mg p.o. 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 Julien Nordmann.   -As per the patient's sister patient still takes Decadron and was changed from 4 mg IV every 12h to 4 mg po BIDwm -Recently had Chemotherapy currently on Carboplatin, Paclitaxel, Keytruda last treatment was 01/29/2018 with Cycle 1 Day 1 -CT of the chest showed metastatic disease to the right kidney, right adrenal glands, and posterior right chest wall at the right lung base  Acute Encephalopathy likely from Sepsis and Respiratory Failure -Improved.  Patient was more alert and awake. She is oriented x3  -Continue to Monitor extremely closely  History of Depression  -Recently started on Zoloft by her Psychiatrist which patient has yet to start.  Normocytic Anemia -Patient's Hb/Hct is now 9.2/26.9 -Checked Anemia Panel: Iron level 44, U IBC of 130, TIBC 174, saturation ratio of 25, ferritin level of 1237, folate level 9.7, vitamin B12 1,634 -Continue to monitor for signs and symptoms of bleeding -Repeat CBC in AM.  Diabetes Mellitus Type 2 -Checked HbA1c was 6.7 -Will place on Sensitive Novolog SSI AC/HS -AM blood Sugars on CMP ranging from 116-178  Abnormal LFT's, improving  -Likely in the setting of sepsis as well as hypovolemia -Admission CMP showed she had an AST of 85 and ALT of 65; repeat today showed that she had an AST of 36, and ALT of 65. -RUQ U/S showed Mild increased echogenicity is identified consistent with fatty infiltration. No focal mass is seen. Portal vein is patent on color Doppler  imaging with normal direction of blood flow towards the liver. There was also gallbaldder sludge noted -Acute Hepatitis Panel Negative -Continue to Monitor and Repeat CMP in AM  History of Tobacco Abuse -Counseling given.  DVT prophylaxis: Enoxaparin 1 mg/kg q12h Code Status: FULL CODE Family Communication: No family present at bedside. Disposition Plan: Remain Inpatient for continued work-up and treatment of HCAP and DVT; Remain in stepdown his O2 requirement is still elevated.  Consultants:   None  Procedures:   ECHOCARDIOGRAM ------------------------------------------------------------------- Study Conclusions  - Left ventricle: The cavity size was normal. There was mild   concentric hypertrophy. Systolic function was normal. The   estimated ejection fraction was in the range of 55% to 60%. Wall   motion was normal; there were no regional wall motion   abnormalities. Doppler parameters are consistent with abnormal   left ventricular relaxation (grade 1 diastolic dysfunction).   There was no evidence of elevated ventricular filling pressure by   Doppler parameters. - Aortic root: The aortic root was normal in size. - Mitral valve: There was trivial regurgitation. - Right ventricle: Systolic function was normal. - Right atrium: The atrium was normal in size. - Tricuspid valve: There was mild regurgitation. - Pulmonic valve: There was trivial regurgitation. - Pulmonary arteries: Systolic pressure was mildly increased. PA   peak  pressure: 39 mm Hg (S). - Inferior vena cava: The vessel was normal in size. - Pericardium, extracardiac: There was no pericardial effusion.   Antimicrobials:  Anti-infectives (From admission, onward)   Start     Dose/Rate Route Frequency Ordered Stop   02/13/18 1000  vancomycin (VANCOCIN) IVPB 1000 mg/200 mL premix     1,000 mg 200 mL/hr over 60 Minutes Intravenous Every 24 hours 02/12/18 2051     02/13/18 0000  ceFEPIme (MAXIPIME) 1 g in  sodium chloride 0.9 % 100 mL IVPB     1 g 200 mL/hr over 30 Minutes Intravenous Every 8 hours 02/12/18 2041 02/20/18 2159   02/12/18 1700  piperacillin-tazobactam (ZOSYN) IVPB 3.375 g     3.375 g 100 mL/hr over 30 Minutes Intravenous  Once 02/12/18 1647 02/12/18 1745   02/12/18 1700  vancomycin (VANCOCIN) IVPB 1000 mg/200 mL premix     1,000 mg 200 mL/hr over 60 Minutes Intravenous  Once 02/12/18 1647 02/12/18 1906     Subjective: Seen and examined at bedside and appears depressed.  Denies any chest pain, lightheadedness, dizziness.  Leg remains swollen but is not painful.  States shortness of breath is about the same and becomes very dyspneic on ambulation or minimal exertion.  No other complaints or concerns at this time.  Objective: Vitals:   02/16/18 0200 02/16/18 0321 02/16/18 0400 02/16/18 0600  BP: (!) 154/91  (!) 159/73 (!) 146/89  Pulse: 84  83 81  Resp: 18  (!) 25 18  Temp:  (!) 97.4 F (36.3 C)    TempSrc:  Axillary    SpO2: 100%  100% 100%  Weight:      Height:       No intake or output data in the 24 hours ending 02/16/18 0731 Filed Weights   02/12/18 1621 02/14/18 0425  Weight: 55.8 kg (123 lb) 57.3 kg (126 lb 5.2 oz)   Examination: Physical Exam:  Constitutional: WN, well-developed African-American female who appears calm in no acute distress Eyes: Anicteric.  Lids and conjunctive are normal ENMT: External ears and nose appear normal.  Grossly normal hearing Neck: Supple with no JVD Respiratory: Diminished to auscultation with some rhonchi and wheezing.  No appreciable crackles.  Wearing supplemental oxygen via high flow nasal cannula and now weaned on 8 L Cardiovascular: Regular rate and rhythm but slightly tachycardic.  Left leg is swollen and at least 1+ edema Abdomen: Soft, nontender, nondistended.  Bowel sounds present all 4 quadrants GU: Deferred Musculoskeletal: No contractures or cyanosis.  No joint deformities noted extremities Skin: Warm and dry.   Left leg is swollen compared to right. No appreciable rashes or lesions on limited skin evaluation Neurologic: Cranial nerves II through XII grossly intact with no appreciable focal deficits Psychiatric: Extremely flat affect.  Intact judgment and insight.  Is awake and alert and oriented x3  Data Reviewed: I have personally reviewed following labs and imaging studies  CBC: Recent Labs  Lab 02/10/18 1211  02/12/18 1659 02/12/18 2040 02/13/18 0322 02/14/18 0343 02/15/18 0316 02/16/18 0336  WBC 3.2*   < > 6.7 6.5 8.1 9.6 8.9 9.8  NEUTROABS 2.6  --  5.9  --   --  9.0* 8.2* 8.9*  HGB 10.4*   < > 10.9* 9.9* 9.1* 8.8* 9.2* 9.2*  HCT 30.0*  --  31.1* 33.6* 26.7* 25.2* 27.4* 26.9*  MCV 90.5  --  90.1 91.1 90.5 90.0 91.6 90.6  PLT 259   < > 267 205 230  239 284 340   < > = values in this interval not displayed.   Basic Metabolic Panel: Recent Labs  Lab 02/12/18 1659 02/12/18 2040 02/13/18 0322 02/14/18 0343 02/15/18 0316 02/16/18 0336  NA 140  --  136 136 137 137  K 3.8  --  3.7 4.2 4.6 4.2  CL 99*  --  102 102 103 101  CO2 23  --  21* 22 26 24   GLUCOSE 109*  --  159* 145* 134* 119*  BUN 22*  --  20 22* 18 15  CREATININE 0.73 0.70 0.57 0.55 0.44 0.42*  CALCIUM 9.5  --  8.8* 9.3 9.3 9.1  MG  --   --   --  2.2 2.1 1.9  PHOS  --   --   --  3.3 3.3 3.1   GFR: Estimated Creatinine Clearance: 58 mL/min (A) (by C-G formula based on SCr of 0.42 mg/dL (L)). Liver Function Tests: Recent Labs  Lab 02/10/18 1211 02/12/18 1659 02/14/18 0343 02/15/18 0316 02/16/18 0336  AST 25 85* 82* 52* 36  ALT 26 65* 90* 78* 65*  ALKPHOS 74 101 97 91 91  BILITOT 0.3 1.0 0.7 0.5 0.4  PROT 6.8 7.9 7.0 6.6 6.1*  ALBUMIN 2.3* 2.8* 2.2* 2.1* 2.1*   No results for input(s): LIPASE, AMYLASE in the last 168 hours. No results for input(s): AMMONIA in the last 168 hours. Coagulation Profile: No results for input(s): INR, PROTIME in the last 168 hours. Cardiac Enzymes: No results for input(s):  CKTOTAL, CKMB, CKMBINDEX, TROPONINI in the last 168 hours. BNP (last 3 results) No results for input(s): PROBNP in the last 8760 hours. HbA1C: Recent Labs    02/14/18 0807  HGBA1C 6.7*   CBG: Recent Labs  Lab 02/15/18 0826 02/15/18 1147 02/15/18 1531 02/15/18 1740 02/15/18 2110  GLUCAP 104* 148* 178* 116* 127*   Lipid Profile: No results for input(s): CHOL, HDL, LDLCALC, TRIG, CHOLHDL, LDLDIRECT in the last 72 hours. Thyroid Function Tests: No results for input(s): TSH, T4TOTAL, FREET4, T3FREE, THYROIDAB in the last 72 hours. Anemia Panel: Recent Labs    02/14/18 0807  VITAMINB12 1,634*  FOLATE 9.7  FERRITIN 1,237*  TIBC 174*  IRON 44  RETICCTPCT 1.5   Sepsis Labs: Recent Labs  Lab 02/12/18 1704 02/12/18 2040 02/12/18 2042 02/12/18 2342 02/14/18 0343 02/15/18 0316 02/16/18 0336  PROCALCITON  --  2.78  --   --  1.68 0.95 0.51  LATICACIDVEN 2.17*  --  1.9 1.2  --   --   --     Recent Results (from the past 240 hour(s))  Blood Culture (routine x 2)     Status: None (Preliminary result)   Collection Time: 02/12/18  4:59 PM  Result Value Ref Range Status   Specimen Description   Final    BLOOD RIGHT ANTECUBITAL Performed at [facility], [place] [address] [place], [place] [address]    Special Requests   Final    BOTTLES DRAWN AEROBIC AND ANAEROBIC Blood Culture results may not be optimal due to an excessive volume of blood received in culture bottles Performed at [place] [address], [place], [place] [address]    Culture   Final    NO GROWTH 3 DAYS Performed at [facility], [place] [address] [place],  [address]    Report Status PENDING  Incomplete  Blood Culture (routine x 2)     Status: None (Preliminary result)   Collection  Time: 02/12/18  4:59 PM  Result Value Ref Range Status   Specimen Description   Final    BLOOD LEFT ANTECUBITAL Performed at [place] [address], [place], [place] [address]    Special Requests   Final    BOTTLES DRAWN AEROBIC AND ANAEROBIC Blood Culture adequate volume Performed at [place] [place] [address] [place], [place] [address]    Culture   Final    NO GROWTH 3 DAYS Performed at [facility], [place] [address], [place], [place] [address]    Report Status PENDING  Incomplete  MRSA PCR Screening     Status: Abnormal   Collection Time: 02/12/18 11:42 PM  Result Value Ref Range Status   MRSA by PCR POSITIVE (A) NEGATIVE Final    Comment:        The GeneXpert MRSA Assay (FDA approved for NASAL specimens only), is one component of a comprehensive MRSA colonization surveillance program. It is not intended to diagnose MRSA infection nor to guide or monitor treatment for MRSA infections. RESULT CALLED TO, READ BACK BY AND VERIFIED WITH: Coletta Memos [id number] 02/13/18 MKELLY Performed at [facility], [place] [address], [place], [place] [address]     Radiology Studies: Ct Angio Chest Pe W Or Wo Contrast  Result Date: 02/14/2018 CLINICAL DATA:  Shortness of breath.  Metastatic lung cancer. EXAM: CT ANGIOGRAPHY CHEST WITH CONTRAST TECHNIQUE: Multidetector CT imaging of the chest was performed using the standard protocol during bolus administration of intravenous contrast. Multiplanar CT image reconstructions and MIPs were obtained to evaluate the vascular anatomy. CONTRAST:  156mL ISOVUE-370 IOPAMIDOL (ISOVUE-370) INJECTION 76% COMPARISON:  Chest x-ray 02/14/2018 and chest CT dated 12/10/2017 FINDINGS: Cardiovascular: There is complete chronic occlusion of the pulmonary artery to the right upper lobe, unchanged since the prior CT scan of 12/10/2017. No discrete pulmonary emboli. Aortic atherosclerosis. Mediastinum/Nodes: Right hilar mass extending into the mediastinum is significantly smaller than on the prior study. Right upper lobe bronchus is now patent and the right upper lobe has  reinflated. Lungs/Pleura: Extensive infiltrate throughout the left lung, most extensive in left upper lobe. Extensive emphysematous changes in the left lung. Pleural based soft tissue mass posteriorly at the right lung base best seen on image 60 of series 4 consistent with metastatic disease. This is slightly smaller than on the prior study. Upper Abdomen: Metastatic lesion in the upper pole of the right kidney. Nodularity of the right adrenal gland probably represents metastatic disease. Musculoskeletal: No chest wall abnormality. No acute or significant osseous findings. Review of the MIP images confirms the above findings. IMPRESSION: 1. No acute pulmonary emboli. 2. 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 3. Decreased size of right hilar mass with new patency of the right upper lobe bronchus with reinflation of the right upper lobe. 4. Extensive infiltrates throughout the left lung, most prominent in the right upper lobe. 5. Aortic Atherosclerosis (ICD10-I70.0) and Emphysema (ICD10-J43.9). 6. Metastatic disease to the right kidney, right adrenal glands, and to the posterior right chest wall at the right lung base. Electronically Signed   By: Lorriane Shire M.D.   On: 02/14/2018 13:21   US Abdomen Limited Ruq  Result Date: 02/14/2018 CLINICAL DATA:  Abnormal LFTs EXAM: ULTRASOUND ABDOMEN LIMITED RIGHT UPPER QUADRANT COMPARISON:  None. FINDINGS: Gallbladder: Gallbladder is well distended. Mild gallbladder sludge is noted within. No gallstones are seen. No wall thickening is noted. Common bile duct:  Diameter: 1.9 mm Liver: Mild increased echogenicity is identified consistent with fatty infiltration. No focal mass is seen. Portal vein is patent on color Doppler imaging with normal direction of blood flow towards the liver. IMPRESSION: Gallbladder sludge. Fatty liver. Electronically Signed   By: Inez Catalina M.D.   On: 02/14/2018 10:38   Scheduled Meds: . amLODipine  5 mg  Oral Daily  . Chlorhexidine Gluconate Cloth  6 each Topical Q0600  . dexamethasone  4 mg Oral BID WC  . enoxaparin (LOVENOX) injection  60 mg Subcutaneous Q12H  . feeding supplement (ENSURE ENLIVE)  237 mL Oral BID BM  . guaiFENesin  1,200 mg Oral BID  . insulin aspart  0-5 Units Subcutaneous QHS  . insulin aspart  0-9 Units Subcutaneous TID WC  . ipratropium  0.5 mg Nebulization BID  . levalbuterol  0.63 mg Nebulization BID  . mupirocin ointment  1 application Nasal BID  . pantoprazole  40 mg Oral Daily   Continuous Infusions: . ceFEPime (MAXIPIME) IV 1 g (02/16/18 0537)  . vancomycin Stopped (02/15/18 1050)    LOS: 4 days   Kerney Elbe, DO [facility] Pager [phone]  If 7PM-7AM, please contact night-coverage [email or web address] Password [facility] 02/16/2018, 7:31 AM

## 2018-02-17 ENCOUNTER — Other Ambulatory Visit: Payer: Self-pay | Admitting: Radiation Therapy

## 2018-02-17 ENCOUNTER — Inpatient Hospital Stay (HOSPITAL_COMMUNITY): Payer: Medicare HMO

## 2018-02-17 ENCOUNTER — Telehealth: Payer: Self-pay | Admitting: *Deleted

## 2018-02-17 ENCOUNTER — Ambulatory Visit: Admission: RE | Admit: 2018-02-17 | Payer: Medicare HMO | Source: Ambulatory Visit | Admitting: Radiation Oncology

## 2018-02-17 DIAGNOSIS — C7949 Secondary malignant neoplasm of other parts of nervous system: Principal | ICD-10-CM

## 2018-02-17 DIAGNOSIS — C7931 Secondary malignant neoplasm of brain: Secondary | ICD-10-CM

## 2018-02-17 LAB — GLUCOSE, CAPILLARY
GLUCOSE-CAPILLARY: 162 mg/dL — AB (ref 65–99)
GLUCOSE-CAPILLARY: 144 mg/dL — AB (ref 65–99)
Glucose-Capillary: 70 mg/dL (ref 65–99)
Glucose-Capillary: 180 mg/dL — ABNORMAL HIGH (ref 65–99)

## 2018-02-17 LAB — CULTURE, BLOOD (ROUTINE X 2)
CULTURE: NO GROWTH
CULTURE: NO GROWTH
Special Requests: ADEQUATE

## 2018-02-17 LAB — COMPREHENSIVE METABOLIC PANEL
ALBUMIN: 2.2 g/dL — AB (ref 3.5–5.0)
ALK PHOS: 90 U/L (ref 38–126)
ALT: 54 U/L (ref 14–54)
AST: 32 U/L (ref 15–41)
Anion gap: 11 (ref 5–15)
BUN: 12 mg/dL (ref 6–20)
CALCIUM: 9 mg/dL (ref 8.9–10.3)
CO2: 27 mmol/L (ref 22–32)
Chloride: 98 mmol/L — ABNORMAL LOW (ref 101–111)
Creatinine, Ser: 0.37 mg/dL — ABNORMAL LOW (ref 0.44–1.00)
GFR calc Af Amer: >60 mL/min (ref >60)
GFR calc non Af Amer: >60 mL/min (ref >60)
GLUCOSE: 85 mg/dL (ref 65–99)
Potassium: 4 mmol/L (ref 3.5–5.1)
Sodium: 136 mmol/L (ref 135–145)
Total Bilirubin: 0.3 mg/dL (ref 0.3–1.2)
Total Protein: 6.6 g/dL (ref 6.5–8.1)

## 2018-02-17 LAB — CBC WITH DIFFERENTIAL/PLATELET
BASOS ABS: 0.1 10*3/uL (ref 0.0–0.1)
Basophils Relative: 1 %
EOS PCT: 0 %
Eosinophils Absolute: 0 10*3/uL (ref 0.0–0.7)
HCT: 29.3 % — ABNORMAL LOW (ref 36.0–46.0)
HEMOGLOBIN: 9.7 g/dL — AB (ref 12.0–15.0)
LYMPHS PCT: 5 %
Lymphs Abs: 0.5 10*3/uL — ABNORMAL LOW (ref 0.7–4.0)
MCH: 30.3 pg (ref 26.0–34.0)
MCHC: 33.1 g/dL (ref 30.0–36.0)
MCV: 91.6 fL (ref 78.0–100.0)
MONOS PCT: 5 %
Monocytes Absolute: 0.5 10*3/uL (ref 0.1–1.0)
NEUTROS PCT: 89 %
Neutro Abs: 9.1 10*3/uL — ABNORMAL HIGH (ref 1.7–7.7)
Platelets: 383 10*3/uL (ref 150–400)
RBC: 3.2 MIL/uL — AB (ref 3.87–5.11)
RDW: 15 % (ref 11.5–15.5)
WBC: 10.2 10*3/uL (ref 4.0–10.5)
nRBC: 1 /100 WBC — ABNORMAL HIGH

## 2018-02-17 LAB — PHOSPHORUS: Phosphorus: 3.4 mg/dL (ref 2.5–4.6)

## 2018-02-17 LAB — MAGNESIUM: Magnesium: 1.8 mg/dL (ref 1.7–2.4)

## 2018-02-17 MED ORDER — FUROSEMIDE 10 MG/ML IJ SOLN
40.0000 mg | Freq: Once | INTRAMUSCULAR | Status: AC
  Administered 2018-02-17: 40 mg via INTRAVENOUS
  Filled 2018-02-17: qty 4

## 2018-02-17 NOTE — Progress Notes (Signed)
[place] for Lovenox Indication: DVT  No Known Allergies  Patient Measurements: Height: 5\' 3"  (160 cm) Weight: 126 lb 5.2 oz (57.3 kg) IBW/kg (Calculated) : 52.4  Vital Signs: Temp: 96.9 F (36.1 C) (04/29 1200) Temp Source: Axillary (04/29 1200) BP: 131/78 (04/29 1000) Pulse Rate: 109 (04/29 1000)  Labs: Recent Labs    02/15/18 0316 02/16/18 0336 02/17/18 0359  HGB 9.2* 9.2* 9.7*  HCT 27.4* 26.9* 29.3*  PLT 284 340 383  CREATININE 0.44 0.42* 0.37*    Estimated Creatinine Clearance: 58 mL/min (A) (by C-G formula based on SCr of 0.37 mg/dL (L)).   Medications:  Scheduled:  . amLODipine  5 mg Oral Daily  . arformoterol  15 mcg Nebulization BID  . Chlorhexidine Gluconate Cloth  6 each Topical Q0600  . dexamethasone  4 mg Oral BID WC  . enoxaparin (LOVENOX) injection  60 mg Subcutaneous Q12H  . feeding supplement (ENSURE ENLIVE)  237 mL Oral BID BM  . furosemide  40 mg Intravenous Once  . guaiFENesin  1,200 mg Oral BID  . insulin aspart  0-5 Units Subcutaneous QHS  . insulin aspart  0-9 Units Subcutaneous TID WC  . ipratropium  0.5 mg Nebulization BID  . levalbuterol  0.63 mg Nebulization BID  . mupirocin ointment  1 application Nasal BID  . pantoprazole  40 mg Oral Daily   Infusions:  . ceFEPime (MAXIPIME) IV Stopped (02/17/18 0703)  . vancomycin 1,000 mg (02/17/18 1001)    Assessment: 53 yoF admitted on 4/24 with AMS, chills, URI, suspected pneumonia.  PMH significant for metastatic lung cancer.  Pharmacy is consulted to dose Lovenox for acute DVT.  Lovenox 40 mg SQ sq24 for VTE prophylaxis started on admission.  Last dose given on 4/25 at ~2200. SCr 0.37 CBC: Hgb improved to 9.7 (baseline ~ 10-11), Plt WNL. No bleeding reported.  Goal of Therapy:  Anti-Xa level 0.6-1 units/ml 4hrs after LMWH dose given Monitor platelets by anticoagulation protocol: Yes   Plan:   Lovenox 1 mg/kg (60 mg) SQ q12h  Pharmacy will  sign off note writing, but will continue to f/u peripherally.   Gretta Arab PharmD, BCPS Pager [phone] 02/17/2018 1:42 PM

## 2018-02-17 NOTE — Progress Notes (Signed)
PROGRESS NOTE    Beth Alexander  WIO:[id number] DOB: 1951/05/11 DOA: 02/12/2018 PCP: [name], MD   Brief Narrative:  Beth Alexander is a 67 y.o. 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  Patient has been having symptoms of upper respiratory infection over the last 4 days and states her son also had similar infection.  Patient had gone to her oncologist 2 days ago and was placed on antibiotics.  Today patient sister came to pick her up for appointment when patient was found to be weak and confused and was having chills.  Patient was brought to the ER.  In the ED the patient was found to be tachycardic with hypoxia.  Initially requiring nonrebreather.  Chest x-ray showed features concerning for pneumonia.  Blood cultures were obtained and placed on empiric antibiotics for healthcare associated pneumonia. She was admitted to the Step Down Unit and now has been weaned to 8 Liters HFNC. Found to have an DVT and placed on Full dose Lovenox due to Cancer.   Assessment & Plan:   Principal Problem:   Sepsis ([place]) Active Problems:   Hypertension   Squamous cell carcinoma of right lung (HCC)   HCAP (healthcare-associated pneumonia)   Pressure injury of skin  Acute Respiratory Failure with Hypoxia secondary to HCAP -Patient was initially placed on 100% non-rebreather and then on 8L HFNC and now attempting to wean to 3 Liters however had to go back on H -Continue supplemental oxygen and wean as tolerated;  -Continuous pulse oximetry and maintain O2 saturations greater than 92% -Patient does not wear any home oxygen -CXR on Admission showed Asymmetric interstitial pattern throughout the left lung likely represents interstitial pneumonia or asymmetrical edema. No change. -C/w Xopenex/Atrovent combination twice  daily -Continue with antibiotic coverage with IV Cefepime. Will stop IV Vancomycin -C/w Guaifenesin 1200 mg p.o. 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  There is also decreased size of right hilar mass with the new pain to the right upper lobe bronchus with reinflation right upper lobe.  Extensive infiltrates throughout the left lung mostly prominent in the left upper lobe -ECHOCardiogram done and showed EF of 55-60% with G1DD and PA pressure of 39 mmHg -CXR this AM showed Stable mild diffuse interstitial left lung opacity is noted consistent with inflammation or asymmetric edema. Stable minimal right basilar opacity. -We will try IV Lasix 40 mg again  -We will also add Arformoterol nebulizer solution 15 mcg twice daily -Oxygen requirement is slowly being weaned but not improving very much so will obtain a Pulmonary Consult and will discuss with PCCM in AM  Sepsis 2/2 to HCAP -Given IVF Rehydration wit 1750 mL on Admission and has not further IV fluid hydration -Sepsis physiology has been improved -Continue with antibiotic coverage with IV Cefepime and will stop IV Vancomycin -Chest x-ray this AM showed Stable mild diffuse interstitial left lung opacity is noted consistent with inflammation or asymmetric edema. Stable minimal right basilar opacity. -Blood cultures x2 showed NGTD at 5 days  -Influenza a and B via PCR were negative.  Patient was PCR positive for MRSA -Strep Urine Ag and Legionella antigens Negative -Lactic acid level on admission was 2.17 as  well as procalcitonin was 2.78.  Lactic acid level is improved to 1.2 now Procalcitonin is trended down to 0.51 -Continue to Monitor and  follow cultures -Of note patient was placed on azithromycin as an outpatient by her Oncology team for an upper respiratory tract  infection  Acute Left Leg DVT -Left Leg Severely swollen compared to the Right -Lower Extremity Duplex of the Left LE showed evidence of acute deep vein thrombosis involving external iliac, common femoral, peroneal, intramuscular gastrocnemius, soleal veins of the left lower extremity. -Anticoagulated with full dose Lovenox given patient has Cancer  Lactic Acidosis, improved -Patient's lactic acid on admission was 2.17 improved to 1.2 -Given IV fluid hydration and improved.  Essential Hypertension -Continue with Amlodipine 5 mg p.o. 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 Julien Nordmann.   -As per the patient's sister patient still takes Decadron and was changed from 4 mg IV every 12h to 4 mg po BIDwm -Recently had Chemotherapy currently on Carboplatin, Paclitaxel, Keytruda last treatment was 01/29/2018 with Cycle 1 Day 1 -CT of the chest showed metastatic disease to the right kidney, right adrenal glands, and posterior right chest wall at the right lung base  Acute Encephalopathy likely from Sepsis and Respiratory Failure -Improved.  Patient was more alert and awake. She is oriented x3  -Continue to Monitor extremely closely  History of Depression  -Recently started on Zoloft by her Psychiatrist which patient has yet to start.  Normocytic Anemia -Patient's Hb/Hct is now 9.7/29.3 -Checked Anemia Panel: Iron level 44, U IBC of 130, TIBC 174, saturation ratio of 25, ferritin level of 1237, folate level 9.7, vitamin B12 1,634 -Continue to monitor for signs and symptoms of bleeding -Repeat CBC in AM.  Diabetes Mellitus Type 2 -Checked HbA1c was 6.7 -Will place on Sensitive Novolog SSI AC/HS -AM blood Sugars on CMP ranging from 70-235  Abnormal LFT's, improved -Likely in the setting of sepsis as well as hypovolemia -Admission CMP showed she had an AST of 85 and ALT of 65; repeat today showed that she had  an AST of 32, and ALT of 54. -RUQ U/S showed Mild increased echogenicity is identified consistent with fatty infiltration. No focal mass is seen. Portal vein is patent on color Doppler imaging with normal direction of blood flow towards the liver. There was also gallbaldder sludge noted -Acute Hepatitis Panel Negative -Continue to Monitor and Repeat CMP in AM  History of Tobacco Abuse -Counseling given.  DVT prophylaxis: Enoxaparin 1 mg/kg q12h Code Status: FULL CODE Family Communication: No family present at bedside. Disposition Plan: Remain Inpatient for continued work-up and treatment of HCAP and DVT; Will transfer to Telemetry now that O2 Requirements are being weaned  Consultants:   None  Procedures:   ECHOCARDIOGRAM ------------------------------------------------------------------- Study Conclusions  - Left ventricle: The cavity size was normal. There was mild   concentric hypertrophy. Systolic function was normal. The   estimated ejection fraction was in the range of 55% to 60%. Wall   motion was normal; there were no regional wall motion   abnormalities. Doppler parameters are consistent with abnormal   left ventricular relaxation (grade 1 diastolic dysfunction).   There was no evidence of elevated ventricular filling pressure by   Doppler parameters. - Aortic root: The aortic root was normal in size. - Mitral valve: There was trivial regurgitation. - Right ventricle: Systolic function was normal. - Right atrium: The atrium was normal  in size. - Tricuspid valve: There was mild regurgitation. - Pulmonic valve: There was trivial regurgitation. - Pulmonary arteries: Systolic pressure was mildly increased. PA   peak pressure: 39 mm Hg (S). - Inferior vena cava: The vessel was normal in size. - Pericardium, extracardiac: There was no pericardial effusion.   Antimicrobials:  Anti-infectives (From admission, onward)   Start     Dose/Rate Route Frequency Ordered Stop    02/13/18 1000  vancomycin (VANCOCIN) IVPB 1000 mg/200 mL premix     1,000 mg 200 mL/hr over 60 Minutes Intravenous Every 24 hours 02/12/18 2051     02/13/18 0000  ceFEPIme (MAXIPIME) 1 g in sodium chloride 0.9 % 100 mL IVPB     1 g 200 mL/hr over 30 Minutes Intravenous Every 8 hours 02/12/18 2041 02/20/18 2159   02/12/18 1700  piperacillin-tazobactam (ZOSYN) IVPB 3.375 g     3.375 g 100 mL/hr over 30 Minutes Intravenous  Once 02/12/18 1647 02/12/18 1745   02/12/18 1700  vancomycin (VANCOCIN) IVPB 1000 mg/200 mL premix     1,000 mg 200 mL/hr over 60 Minutes Intravenous  Once 02/12/18 1647 02/12/18 1906     Subjective: Seen and examined at bedside breathing was okay and had not really improved.  No chest pain, shortness breath, nausea, vomiting.  Wanting to know when she can be discharged.  Objective: Vitals:   02/17/18 0000 02/17/18 0200 02/17/18 0356 02/17/18 0600  BP: (!) 107/91 (!) 146/89  (!) 141/70  Pulse: 98 80  79  Resp: (!) 30 18  (!) 22  Temp:   97.7 F (36.5 C)   TempSrc:   Axillary   SpO2: 100% 100%  99%  Weight:      Height:        Intake/Output Summary (Last 24 hours) at 02/17/2018 0739 Last data filed at 02/17/2018 6578 Gross per 24 hour  Intake 740 ml  Output 1300 ml  Net -560 ml   Filed Weights   02/12/18 1621 02/14/18 0425  Weight: 55.8 kg (123 lb) 57.3 kg (126 lb 5.2 oz)   Examination: Physical Exam:  Constitutional: Well-nourished, well-developed African-American female, in no acute distress she is wearing supplemental oxygen via nasal Eyes: Sclera are anicteric.  Lids and conjunctive are normal ENMT: External ears and nose appear normal.  Grossly normal hearing. Neck: Supple with no JVD Respiratory: Diminished to auscultation with some rhonchi and wheezing.  No appreciable rales.  Minimal crackles.  Wearing supplemental oxygen and attempting to be weaned down to 3 L Cardiovascular: Slightly tachycardic rate and rhythm.  No appreciable murmurs,  rubs, gallops.  Left leg is swollen 1+ lower extremity Abdomen: Soft, nontender, nondistended.  Bowel sounds present all 4 quadrants GU: Deferred Musculoskeletal: No contractures or cyanosis.  No joint deformities noted Skin: Warm and dry.  Left leg is swollen compared to right.  No appreciable rashes or lesions on a limited evaluation Neurologic: Cranial nerves II through XII grossly intact with no appreciable focal deficit Psychiatric: Depressed appearing mood and extremely flat affect.  Intact judgment and insight.  Is awake alert and oriented x3  Data Reviewed: I have personally reviewed following labs and imaging studies  CBC: Recent Labs  Lab 02/12/18 1659  02/13/18 0322 02/14/18 0343 02/15/18 0316 02/16/18 0336 02/17/18 0359  WBC 6.7   < > 8.1 9.6 8.9 9.8 10.2  NEUTROABS 5.9  --   --  9.0* 8.2* 8.9* 9.1*  HGB 10.9*   < > 9.1* 8.8* 9.2* 9.2* 9.7*  HCT 31.1*   < > 26.7* 25.2* 27.4* 26.9* 29.3*  MCV 90.1   < > 90.5 90.0 91.6 90.6 91.6  PLT 267   < > 230 239 284 340 383   < > = values in this interval not displayed.   Basic Metabolic Panel: Recent Labs  Lab 02/13/18 0322 02/14/18 0343 02/15/18 0316 02/16/18 0336 02/17/18 0359  NA 136 136 137 137 136  K 3.7 4.2 4.6 4.2 4.0  CL 102 102 103 101 98*  CO2 21* 22 26 24 27   GLUCOSE 159* 145* 134* 119* 85  BUN 20 22* 18 15 12   CREATININE 0.57 0.55 0.44 0.42* 0.37*  CALCIUM 8.8* 9.3 9.3 9.1 9.0  MG  --  2.2 2.1 1.9 1.8  PHOS  --  3.3 3.3 3.1 3.4   GFR: Estimated Creatinine Clearance: 58 mL/min (A) (by C-G formula based on SCr of 0.37 mg/dL (L)). Liver Function Tests: Recent Labs  Lab 02/12/18 1659 02/14/18 0343 02/15/18 0316 02/16/18 0336 02/17/18 0359  AST 85* 82* 52* 36 32  ALT 65* 90* 78* 65* 54  ALKPHOS 101 97 91 91 90  BILITOT 1.0 0.7 0.5 0.4 0.3  PROT 7.9 7.0 6.6 6.1* 6.6  ALBUMIN 2.8* 2.2* 2.1* 2.1* 2.2*   No results for input(s): LIPASE, AMYLASE in the last 168 hours. No results for input(s): AMMONIA  in the last 168 hours. Coagulation Profile: No results for input(s): INR, PROTIME in the last 168 hours. Cardiac Enzymes: No results for input(s): CKTOTAL, CKMB, CKMBINDEX, TROPONINI in the last 168 hours. BNP (last 3 results) No results for input(s): PROBNP in the last 8760 hours. HbA1C: Recent Labs    02/14/18 0807  HGBA1C 6.7*   CBG: Recent Labs  Lab 02/15/18 2110 02/16/18 0853 02/16/18 1220 02/16/18 1629 02/16/18 2131  GLUCAP 127* 86 91 145* 235*   Lipid Profile: No results for input(s): CHOL, HDL, LDLCALC, TRIG, CHOLHDL, LDLDIRECT in the last 72 hours. Thyroid Function Tests: No results for input(s): TSH, T4TOTAL, FREET4, T3FREE, THYROIDAB in the last 72 hours. Anemia Panel: Recent Labs    02/14/18 0807  VITAMINB12 1,634*  FOLATE 9.7  FERRITIN 1,237*  TIBC 174*  IRON 44  RETICCTPCT 1.5   Sepsis Labs: Recent Labs  Lab 02/12/18 1704 02/12/18 2040 02/12/18 2042 02/12/18 2342 02/14/18 0343 02/15/18 0316 02/16/18 0336  PROCALCITON  --  2.78  --   --  1.68 0.95 0.51  LATICACIDVEN 2.17*  --  1.9 1.2  --   --   --     Recent Results (from the past 240 hour(s))  Blood Culture (routine x 2)     Status: None (Preliminary result)   Collection Time: 02/12/18  4:59 PM  Result Value Ref Range Status   Specimen Description   Final    BLOOD RIGHT ANTECUBITAL Performed at [facility], [place] [address] [place], [place] [address]    Special Requests   Final    BOTTLES DRAWN AEROBIC AND ANAEROBIC Blood Culture results may not be optimal due to an excessive volume of blood received in culture bottles Performed at [facility] [address] [place], [place] [address]    Culture   Final    NO GROWTH 4 DAYS Performed at [facility], [place] [address], [place], [place] [address]    Report Status PENDING  Incomplete  Blood Culture (routine x 2)     Status: None (Preliminary result)   Collection Time: 02/12/18  4:59 PM   Result Value Ref Range Status   Specimen Description   Final    BLOOD LEFT ANTECUBITAL Performed at [facility] [address], [place], [place] [address]    Special Requests   Final    BOTTLES DRAWN AEROBIC AND ANAEROBIC Blood Culture adequate volume Performed at [facility] [address] [place], [place] [address]    Culture   Final    NO GROWTH 4 DAYS Performed at [facility], [place] [address], [place], [place] [address]    Report Status PENDING  Incomplete  MRSA PCR Screening     Status: Abnormal   Collection Time: 02/12/18 11:42 PM  Result Value Ref Range Status   MRSA by PCR POSITIVE (A) NEGATIVE Final    Comment:        The GeneXpert MRSA Assay (FDA approved for NASAL specimens only), is one component of a comprehensive MRSA colonization surveillance program. It is not intended to diagnose MRSA infection nor to guide or monitor treatment for MRSA infections. RESULT CALLED TO, READ BACK BY AND VERIFIED WITH: Coletta Memos [id number] 02/13/18 MKELLY Performed at [facility], [place] [address] [place], [place] [address]     Radiology Studies: Dg Chest Port 1 View  Result Date: 02/17/2018 CLINICAL DATA:  Shortness of breath. EXAM: PORTABLE CHEST 1 VIEW COMPARISON:  Radiograph of February 16, 2018. FINDINGS: The heart size and mediastinal contours are within normal limits. Atherosclerosis of thoracic aorta is noted. No pneumothorax or pleural effusion is noted. Stable mild diffuse interstitial density seen in left lung consistent with inflammation or asymmetric edema. Stable minimal right basilar opacity is noted. The visualized skeletal structures are unremarkable. IMPRESSION: Stable mild diffuse interstitial left lung opacity is noted consistent with inflammation or asymmetric edema. Stable minimal right basilar opacity. Aortic Atherosclerosis (ICD10-I70.0). Electronically Signed   By: Marijo Conception, M.D.   On:  02/17/2018 07:21   Dg Chest Port 1 View  Result Date: 02/16/2018 CLINICAL DATA:  Lung cancer.  Brain metastases. EXAM: PORTABLE CHEST 1 VIEW COMPARISON:  February 14, 2018 FINDINGS: The cardiomediastinal silhouette is stable. No pneumothorax. Diffuse left infiltrate, similar to slightly improved in the interval. Patchy infiltrate in the right lung is stable. IMPRESSION: Minimal improvement of the diffuse left infiltrate. Mild patchy opacity in the right lung is stable. No other changes. Electronically Signed   By: Dorise Bullion III M.D   On: 02/16/2018 07:46   Scheduled Meds: . amLODipine  5 mg Oral Daily  . arformoterol  15 mcg Nebulization BID  . Chlorhexidine Gluconate Cloth  6 each Topical Q0600  . dexamethasone  4 mg Oral BID WC  . enoxaparin (LOVENOX) injection  60 mg Subcutaneous Q12H  . feeding supplement (ENSURE ENLIVE)  237 mL Oral BID BM  . guaiFENesin  1,200 mg Oral BID  . insulin aspart  0-5 Units Subcutaneous QHS  . insulin aspart  0-9 Units Subcutaneous TID WC  . ipratropium  0.5 mg Nebulization BID  . levalbuterol  0.63 mg Nebulization BID  . mupirocin ointment  1 application Nasal BID  . pantoprazole  40 mg Oral Daily   Continuous Infusions: . ceFEPime (MAXIPIME) IV 1 g (02/17/18 6144)  . vancomycin Stopped (02/16/18 1007)    LOS: 5 days   Kerney Elbe, DO [facility] Pager [phone]  If 7PM-7AM, please contact night-coverage [email or web address] Password [facility] 02/17/2018, 7:39 AM

## 2018-02-17 NOTE — Telephone Encounter (Signed)
FYI "[name] calling for my  Sister.  She's been in the hospital since last week.  Calling to let Dr. Julien Nordmann know she will not be able to come in for her next chemotherapy treatment.  They have not given [place] any inkling as to when she may be discharged."

## 2018-02-18 ENCOUNTER — Inpatient Hospital Stay (HOSPITAL_COMMUNITY): Payer: Medicare HMO

## 2018-02-18 DIAGNOSIS — C3491 Malignant neoplasm of unspecified part of right bronchus or lung: Secondary | ICD-10-CM

## 2018-02-18 DIAGNOSIS — J189 Pneumonia, unspecified organism: Secondary | ICD-10-CM

## 2018-02-18 LAB — CBC WITH DIFFERENTIAL/PLATELET
Basophils Absolute: 0.1 10*3/uL (ref 0.0–0.1)
Basophils Relative: 0 %
EOS ABS: 0 10*3/uL (ref 0.0–0.7)
EOS PCT: 0 %
HEMATOCRIT: 29.2 % — AB (ref 36.0–46.0)
HEMOGLOBIN: 9.8 g/dL — AB (ref 12.0–15.0)
LYMPHS ABS: 0.8 10*3/uL (ref 0.7–4.0)
Lymphocytes Relative: 7 %
MCH: 30.8 pg (ref 26.0–34.0)
MCHC: 33.6 g/dL (ref 30.0–36.0)
MCV: 91.8 fL (ref 78.0–100.0)
MONO ABS: 0.2 10*3/uL (ref 0.1–1.0)
MONOS PCT: 2 %
Neutro Abs: 10.2 10*3/uL (ref 1.7–7.7)
Neutrophils Relative %: 91 %
Platelets: 411 10*3/uL — ABNORMAL HIGH (ref 150–400)
RBC: 3.18 MIL/uL — AB (ref 3.87–5.11)
RDW: 15.1 % (ref 11.5–15.5)
WBC: 11.2 10*3/uL — AB (ref 4.0–10.5)
nRBC: 1 /100 WBC — ABNORMAL HIGH

## 2018-02-18 LAB — PHOSPHORUS: Phosphorus: 3.4 mg/dL (ref 2.5–4.6)

## 2018-02-18 LAB — COMPREHENSIVE METABOLIC PANEL
ALK PHOS: 81 U/L (ref 38–126)
ALT: 52 U/L (ref 14–54)
ANION GAP: 12 (ref 5–15)
AST: 29 U/L (ref 15–41)
Albumin: 2.3 g/dL — ABNORMAL LOW (ref 3.5–5.0)
BILIRUBIN TOTAL: 0.3 mg/dL (ref 0.3–1.2)
BUN: 14 mg/dL (ref 6–20)
CALCIUM: 8.8 mg/dL — AB (ref 8.9–10.3)
CO2: 26 mmol/L (ref 22–32)
CREATININE: 0.42 mg/dL — AB (ref 0.44–1.00)
Chloride: 99 mmol/L — ABNORMAL LOW (ref 101–111)
Glucose, Bld: 115 mg/dL — ABNORMAL HIGH (ref 65–99)
Potassium: 4.1 mmol/L (ref 3.5–5.1)
Sodium: 137 mmol/L (ref 135–145)
TOTAL PROTEIN: 6.3 g/dL — AB (ref 6.5–8.1)

## 2018-02-18 LAB — GLUCOSE, CAPILLARY
Glucose-Capillary: 79 mg/dL (ref 65–99)
Glucose-Capillary: 132 mg/dL — ABNORMAL HIGH (ref 65–99)
Glucose-Capillary: 177 mg/dL — ABNORMAL HIGH (ref 65–99)

## 2018-02-18 LAB — MAGNESIUM: Magnesium: 1.8 mg/dL (ref 1.7–2.4)

## 2018-02-18 NOTE — Consult Note (Signed)
PULMONARY / CRITICAL CARE MEDICINE   Name: Beth Alexander MRN: [id number] DOB: 1950/11/06    ADMISSION DATE:  02/12/2018 CONSULTATION DATE:  4/30  REFERRING MD:  Alfredia Ferguson   CHIEF COMPLAINT:  Acute hypoxic respiratory failure  HISTORY OF PRESENT ILLNESS:    This is a 67 year old former smoker w/ dx of NSCLC stage IV (squamous cell), presented w/ right hilar mass and RUL collapse. She is s/p radiation to the brain and xrt to RUL completed 01/17/18. Most recently completed cycle 1, day 1 of carboplatin, paclitaxel and Kayruda on 01/29/18. Seen in oncology office on 4/22 w/ cc: weakness and non-productive cough. Had recent exposure to a son who had URI symptoms. She stated her symptoms started about 3 days after exposure to her son. She presented to the emergency room on 4/24 with chief complaint of weakness, chills, confusion, and nonproductive cough.  She was found by her sister this state when she was scheduled to be picked up for in the emergency room toxic breather chest x-ray reason for pneumonia she was admitted to medicine service.  To date she has been treated with IV antibiotics, she is now down to 2 L nasal cannula, subsequent chest x-ray films improving.  However she is not yet back to room air.  Pulmonary asked to see to make further recommendations in regards to treatment of pneumonia/hypoxic respiratory failure.  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  PAST SURGICAL HISTORY: She  has a past surgical history that includes Breast lumpectomy with radioactive seed localization (Left, 11/08/2016) and Video bronchoscopy with endobronchial ultrasound (N/A, 12/11/2017).  No Known Allergies  No current facility-administered medications on file prior to encounter.    Current Outpatient Medications on File Prior to Encounter  Medication Sig  . acetaminophen (TYLENOL) 500 MG  tablet Take 500 mg by mouth every 6 (six) hours as needed for mild pain or headache.  Marland Kitchen amLODipine (NORVASC) 5 MG tablet Take 1 tablet (5 mg total) by mouth daily.  Marland Kitchen azithromycin (ZITHROMAX) 250 MG tablet 2 tabs day 1 then 1 tab daily for 4 days  . chlorpheniramine-HYDROcodone (TUSSIONEX PENNKINETIC ER) 10-8 MG/5ML SUER Take 5 mLs by mouth every 12 (twelve) hours as needed for cough.  . dexamethasone (DECADRON) 4 MG tablet Take 1 tablet (4 mg total) by mouth 2 (two) times daily with a meal.  . fluconazole (DIFLUCAN) 100 MG tablet Take 1 tablet (100 mg total) by mouth daily.  Marland Kitchen LORazepam (ATIVAN) 0.5 MG tablet 1 tablet po 30 minutes prior to radiation or MRI, or every 4-6 hours prn anxiety  . oxyCODONE (OXY IR/ROXICODONE) 5 MG immediate release tablet Take 1 tablet (5 mg total) by mouth every 4 (four) hours as needed for severe pain.  Marland Kitchen sertraline (ZOLOFT) 50 MG tablet Take 1/2 tab daily for 1 week and than full tab daily  . benzonatate (TESSALON) 100 MG capsule Take 1 capsule (100 mg total) by mouth 3 (three) times daily as needed for cough. (Patient not taking: Reported on 02/12/2018)  . nicotine (NICODERM CQ - DOSED IN MG/24 HOURS) 14 mg/24hr patch Place 1 patch (14 mg total) onto the skin daily. (Patient not taking: Reported on 02/10/2018)  . prochlorperazine (COMPAZINE) 10 MG tablet Take 1 tablet (10 mg total) by mouth every 6 (six) hours as needed for nausea or vomiting. (Patient not taking: Reported on 02/12/2018)  FAMILY HISTORY:  Her indicated that her mother is alive. She indicated that her father is alive. She indicated that her sister is alive. She indicated that her brother is alive.   SOCIAL HISTORY: She  reports that she quit smoking about 2 months ago. She has a 7.50 pack-year smoking history. She has never used smokeless tobacco. She reports that she does not drink alcohol or use drugs.  REVIEW OF SYSTEMS:   General: Endorses weakness, no fever or chills currently.  HEENT: No  headache nasal congestion or sore throat.  Pulmonary: A short of breath with exertion.  Cough is been nonproductive, she denies hemoptysis, pleuritic chest pain, or wheezing.  Cardiac: Denies chest pain palpitations or orthopnea.  Extremities: Denies focal weakness or pain.  Abdomen: Denies nausea or vomiting.  Neuro: Denies headache, dizziness.  Does have flat affect.  Apparently was confused on presentation. SUBJECTIVE:  Slowly feeling better  VITAL SIGNS: Blood Pressure (Abnormal) 139/102 (BP Location: Left Arm)   Pulse (Abnormal) 108   Temperature (Abnormal) 97 F (36.1 C) (Axillary)   Respiration (Abnormal) 24   Height 5\' 3"  (1.6 m)   Weight 126 lb 5.2 oz (57.3 kg)   Oxygen Saturation 99%   Body Mass Index 22.38 kg/m   Currently 2 L nasal cannula HEMODYNAMICS:    VENTILATOR SETTINGS:    INTAKE / OUTPUT: I/O last 3 completed shifts: In: 700 [P.O.:100; IV Piggyback:600] Out: 2500 [Urine:2500]  PHYSICAL EXAMINATION: General: 67 year old African-American female sitting upright in bed currently on 2 L nasal cannula via high flow device Neuro: Awake oriented affect is flat HEENT: Normocephalic atraumatic no jugular venous distention Cardiovascular: Regular rate and rhythm without murmur rub or gallop Lungs: Scattered rhonchi rales, no accessory use Abdomen: Soft nontender no organomegaly Musculoskeletal: Equal strength and bulk warm and dry  Skin: Brisk cap refill  LABS:  BMET Recent Labs  Lab 02/16/18 0336 02/17/18 0359 02/18/18 0357  NA 137 136 137  K 4.2 4.0 4.1  CL 101 98* 99*  CO2 24 27 26   BUN 15 12 14   CREATININE 0.42* 0.37* 0.42*  GLUCOSE 119* 85 115*    Electrolytes Recent Labs  Lab 02/16/18 0336 02/17/18 0359 02/18/18 0357  CALCIUM 9.1 9.0 8.8*  MG 1.9 1.8 1.8  PHOS 3.1 3.4 3.4    CBC Recent Labs  Lab 02/16/18 0336 02/17/18 0359 02/18/18 0357  WBC 9.8 10.2 11.2*  HGB 9.2* 9.7* 9.8*  HCT 26.9* 29.3* 29.2*  PLT 340 383 411*     Coag's No results for input(s): APTT, INR in the last 168 hours.  Sepsis Markers Recent Labs  Lab 02/12/18 1704  02/12/18 2042 02/12/18 2342 02/14/18 0343 02/15/18 0316 02/16/18 0336  LATICACIDVEN 2.17*  --  1.9 1.2  --   --   --   PROCALCITON  --    < >  --   --  1.68 0.95 0.51   < > = values in this interval not displayed.    ABG Recent Labs  Lab 02/12/18 2220  PHART 7.397  PCO2ART 40.0  PO2ART 151*    Liver Enzymes Recent Labs  Lab 02/16/18 0336 02/17/18 0359 02/18/18 0357  AST 36 32 29  ALT 65* 54 52  ALKPHOS 91 90 81  BILITOT 0.4 0.3 0.3  ALBUMIN 2.1* 2.2* 2.3*    Cardiac Enzymes No results for input(s): TROPONINI, PROBNP in the last 168 hours.  Glucose Recent Labs  Lab 02/16/18 2131 02/17/18 0824 02/17/18 1230 02/17/18 1656 02/17/18  2100 02/18/18 0840  GLUCAP 235* 70 180* 162* 144* 79    Imaging Dg Chest Port 1 View  Result Date: 02/18/2018 CLINICAL DATA:  68 year old female with a history of shortness of breath EXAM: PORTABLE CHEST 1 VIEW COMPARISON:  02/17/2018, 02/16/2018, CT 02/14/2018, CT 12/10/2017 FINDINGS: Cardiomediastinal silhouette unchanged in size and contour. Opacity in the right hilar region, similar to the comparison. Similar appearance of reticulonodular opacity of the left lung. No large pleural effusion. Low lung volumes. No displaced fracture. Right posterior chest wall mass with destructive bony changes is better visualized on prior chest CT. IMPRESSION: Similar appearance of the chest x-ray, with reticulonodular opacity throughout the left lung, similar to prior. Differential includes edema, and/or infection, or potentially post treatment changes. Opacity in the right hilar region corresponds to mass on prior CT images. Electronically Signed   By: Corrie Mckusick D.O.   On: 02/18/2018 09:44     STUDIES:  Echocardiogram completed on 4/27:Demonstrates ejection fraction 55 to 60%.  Wall motion was normal.  Doppler parameters  were consistent with grade 1 diastolic dysfunction.  Mild tricuspid regurg.  Estimated pulmonary artery systolic pressure was 39 mmHg CULTURES: 4/24 MRSA PCR was positive 4/24 blood cultures Negative 4/24 urine strep antigen negative 4/24 urine Legionella negative 4/24 influenza panel negative ANTIBIOTICS: Cefepime started 4/24 Vancomycin started 4/24   ASSESSMENT / PLAN:  Acute hypoxic respiratory failure in the setting of bilateral pulmonary infiltrates consistent with probable pneumonia.  Superimposed on underlying stage IV non-small cell lung cancer. -Given radiographic findings wonder more about viral pneumonitis over actual bacterial process however agree with treatment empirically for hcap -Additionally could include drug-induced pneumonitis on differential diagnosis however given leading events doubt this is the case.  Unlikely this is radiation related given clinical course of events and left side predominance -Clinically and radiographically improving -Portable chest x-ray personally reviewed demonstrates persistent right mediastinal consolidation.  Likely demonstrate known history of lung cancer.  Left greater than right patchy infiltrates these are improving bilaterally Plan Repeat procalcitonin Day # 7/vanc and cefepime.  Continue to wean oxygen Agree w/ lasix as BUN/cr and bp allow  Check respiratory viral panel, if procalcitonin negative and respiratory viral panel is positive but certainly discontinue antibiotics following today. Suspect course will continue to improve with time She may need to go home w/ oxygen   Erick Colace ACNP-BC [place] Pager # [phone] OR # [phone] if no answer      Pulmonary and [place] Pager: [phone]  02/18/2018, 10:41 AM

## 2018-02-18 NOTE — Progress Notes (Signed)
PROGRESS NOTE    Beth Alexander  JKD:[id number] DOB: 08/24/1951 DOA: 02/12/2018 PCP: [name], MD   Brief Narrative:  Beth Alexander is a 67 y.o. 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  Patient has been having symptoms of upper respiratory infection over the last 4 days and states her son also had similar infection.  Patient had gone to her oncologist 2 days ago and was placed on antibiotics.  Today patient sister came to pick her up for appointment when patient was found to be weak and confused and was having chills.  Patient was brought to the ER.  In the ED the patient was found to be tachycardic with hypoxia.  Initially requiring nonrebreather.  Chest x-ray showed features concerning for pneumonia.  Blood cultures were obtained and placed on empiric antibiotics for healthcare associated pneumonia. She was admitted to the Step Down Unit and still requiring significant O2. Found to have an DVT and placed on Full dose Lovenox due to Cancer.  Because the patient's respiratory status was not improving, Pulmonary was consulted for further evaluation and recommendations.  Assessment & Plan:   Principal Problem:   Sepsis ([place]) Active Problems:   Hypertension   Squamous cell carcinoma of right lung (HCC)   HCAP (healthcare-associated pneumonia)   Pressure injury of skin  Acute Respiratory Failure with Hypoxia secondary to HCAP -Patient was initially placed on 100% non-rebreather and Weaned to 6 Liters HFNC  -Continue supplemental oxygen and wean as tolerated;  -Continuous pulse oximetry and maintain O2 saturations greater than 92% -Patient does not wear any home oxygen but per PCCM may need Home O2 at D/C -CXR on Admission showed Asymmetric interstitial pattern throughout the left lung likely represents  interstitial pneumonia or asymmetrical edema. No change. -C/w Xopenex/Atrovent combination twice daily -Continue with antibiotic coverage with IV Cefepime. Will stop IV Vancomycin -C/w Guaifenesin 1200 mg p.o. 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  There is also decreased size of right hilar mass with the new pain to the right upper lobe bronchus with reinflation right upper lobe.  Extensive infiltrates throughout the left lung mostly prominent in the left upper lobe -ECHOCardiogram done and showed EF of 55-60% with G1DD and PA pressure of 39 mmHg -CXR this AM showed Stable mild diffuse interstitial left lung opacity is noted consistent with inflammation or asymmetric edema. Stable minimal right basilar opacity. -Tried IV Lasix 40 mg yesterday but will hold today -We will also add Arformoterol nebulizer solution 15 mcg twice daily -Oxygen requirement is slowly being weaned but not improving very much so asked PCCM to Evaluate -Pulmonary obtaining a Respiratory Virus Panel and will be deciding on antibiotic continuation based on repeat Procalcitonin level they are ordering. -Will need PT/OT Evaluation and a Home O2 Ambulatory Walk Screen Prior to Discharge  Sepsis 2/2 to HCAP -Given IVF Rehydration wit 1750 mL on Admission and has not further IV fluid hydration -Sepsis physiology has been improved -Continue with antibiotic coverage with IV Cefepime and stopped IV Vancomycin -Chest x-ray this AM showed Stable mild diffuse interstitial left lung opacity is noted consistent with inflammation or asymmetric edema. Stable minimal right basilar opacity. -Blood cultures x2 showed  NGTD at 5 days  -Influenza a and B via PCR were negative.  Patient was PCR positive for MRSA -Strep Urine Ag and Legionella antigens Negative -Lactic acid  level on admission was 2.17 as well as procalcitonin was 2.78.  Lactic acid level is improved to 1.2 now Procalcitonin is trended down to 0.51 -Continue to Monitor and  follow cultures -Of note patient was placed on Azithromycin as an outpatient by her Oncology team for an upper respiratory tract infection -As above  Acute Left Leg DVT -Left Leg Severely swollen compared to the Right -Lower Extremity Duplex of the Left LE showed evidence of acute deep vein thrombosis involving external iliac, common femoral, peroneal, intramuscular gastrocnemius, soleal veins of the left lower extremity. -Anticoagulated with full dose Lovenox given patient has Cancer  Lactic Acidosis, improved -Patient's lactic acid on admission was 2.17 improved to 1.2 -Given IV fluid hydration and improved.  Essential Hypertension -Continue with Amlodipine 5 mg p.o. 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 Julien Nordmann.   -As per the patient's sister patient still takes Decadron and was changed from 4 mg IV every 12h to 4 mg po BIDwm -Recently had Chemotherapy currently on Carboplatin, Paclitaxel, Keytruda last treatment was 01/29/2018 with Cycle 1 Day 1 -CT of the chest showed metastatic disease to the right kidney, right adrenal glands, and posterior right chest wall at the right lung base -We will need to follow-up with Dr. Julien Nordmann as an outpatient  Acute Encephalopathy likely from Sepsis and Respiratory Failure -Improved.  Patient was more alert and awake. She is oriented x3  -Continue to Monitor extremely closely  History of Depression  -Recently started on Zoloft by her Psychiatrist which patient has yet to start.  Normocytic Anemia -Patient's Hb/Hct is now 9.8/29.2 -Checked Anemia Panel: Iron level 44, U IBC of 130, TIBC 174, saturation ratio of 25, ferritin level of 1237, folate level 9.7, vitamin B12 1,634 -Continue to monitor  for signs and symptoms of bleeding -Repeat CBC in AM.  Diabetes Mellitus Type 2 -Checked HbA1c was 6.7 -Will place on Sensitive Novolog SSI AC/HS -AM blood Sugars on CMP ranging from 79-162  Abnormal LFT's, improved -Likely in the setting of sepsis as well as hypovolemia -Admission CMP showed she had an AST of 85 and ALT of 65; repeat today showed that she had an AST of 29, and ALT of 52. -RUQ U/S showed Mild increased echogenicity is identified consistent with fatty infiltration. No focal mass is seen. Portal vein is patent on color Doppler imaging with normal direction of blood flow towards the liver. There was also gallbaldder sludge noted -Acute Hepatitis Panel Negative -Continue to Monitor and Repeat CMP in AM  History of Tobacco Abuse -Counseling given.  DVT prophylaxis: Enoxaparin 1 mg/kg q12h Code Status: FULL CODE Family Communication: No family present at bedside. Disposition Plan: Remain Inpatient for continued work-up and treatment of HCAP and DVT; Will transfer to Telemetry now that O2 Requirements are being weaned  Consultants:   Pulmonary   Procedures:   ECHOCARDIOGRAM ------------------------------------------------------------------- Study Conclusions  - Left ventricle: The cavity size was normal. There was mild   concentric hypertrophy. Systolic function was normal. The   estimated ejection fraction was in the range of 55% to 60%. Wall   motion was normal; there were no regional wall motion   abnormalities. Doppler parameters are consistent with abnormal   left ventricular relaxation (grade 1  diastolic dysfunction).   There was no evidence of elevated ventricular filling pressure by   Doppler parameters. - Aortic root: The aortic root was normal in size. - Mitral valve: There was trivial regurgitation. - Right ventricle: Systolic function was normal. - Right atrium: The atrium was normal in size. - Tricuspid valve: There was mild regurgitation. -  Pulmonic valve: There was trivial regurgitation. - Pulmonary arteries: Systolic pressure was mildly increased. PA   peak pressure: 39 mm Hg (S). - Inferior vena cava: The vessel was normal in size. - Pericardium, extracardiac: There was no pericardial effusion.   Antimicrobials:  Anti-infectives (From admission, onward)   Start     Dose/Rate Route Frequency Ordered Stop   02/13/18 1000  vancomycin (VANCOCIN) IVPB 1000 mg/200 mL premix  Status:  Discontinued     1,000 mg 200 mL/hr over 60 Minutes Intravenous Every 24 hours 02/12/18 2051 02/17/18 1346   02/13/18 0000  ceFEPIme (MAXIPIME) 1 g in sodium chloride 0.9 % 100 mL IVPB     1 g 200 mL/hr over 30 Minutes Intravenous Every 8 hours 02/12/18 2041 02/20/18 2159   02/12/18 1700  piperacillin-tazobactam (ZOSYN) IVPB 3.375 g     3.375 g 100 mL/hr over 30 Minutes Intravenous  Once 02/12/18 1647 02/12/18 1745   02/12/18 1700  vancomycin (VANCOCIN) IVPB 1000 mg/200 mL premix     1,000 mg 200 mL/hr over 60 Minutes Intravenous  Once 02/12/18 1647 02/12/18 1906     Subjective: Seen and examined at bedside denies any chest pain, nausea, vomiting, lightheadedness or dizziness.  States that she still short of breath some.   Objective: Vitals:   02/18/18 0800 02/18/18 0833 02/18/18 1000 02/18/18 1200  BP: 129/74  122/70 114/73  Pulse: 87  (!) 121 (!) 103  Resp: 18  (!) 32 (!) 25  Temp: (!) 97 F (36.1 C)   (!) 97.3 F (36.3 C)  TempSrc: Axillary   Axillary  SpO2: 100% 99% 94% 94%  Weight:      Height:        Intake/Output Summary (Last 24 hours) at 02/18/2018 1605 Last data filed at 02/18/2018 1300 Gross per 24 hour  Intake 580 ml  Output 1100 ml  Net -520 ml   Filed Weights   02/12/18 1621 02/14/18 0425  Weight: 55.8 kg (123 lb) 57.3 kg (126 lb 5.2 oz)   Examination: Physical Exam:  Constitutional: Well-nourished, well-developed frustrated AAF who is in no acute distress she is wearing oxygen via supplemental High Flow  nasal cannula Eyes: Are anicteric.  Lids and conjunctive are normal ENMT: External ears and nose appear normal.  Grossly normal hearing. Neck: Supple with no JVD Respiratory: Diminished to auscultation with slight rhonchi and mild wheezing.  No appreciable rales.  Crackles have improved.  Continues to wear supplemental oxygen via high flow nasal cannula and is attempting to be going down further. Cardiovascular: Tachycardic rate but regular rhythm.  No appreciable murmurs, rubs, gallops.  Left leg is swollen and multiple extremity edema compared to right given her acute DVT Abdomen: Soft, nontender nondistended.  Bowel sounds present all 4 quadrants GU: Deferred Musculoskeletal: Contractures are noted.  No joint deformities noted Skin: Skin is warm and dry.  Left leg was swollen compared to right.  No appreciable rashes or lesions on limited skin evaluation Neurologic: Cranial nerves II through XII grossly intact with no appreciable focal deficit Psychiatric: Appears depressed and has extremely flat affect.  Judgment intact.  Is awake, alert and  oriented x3.  Data Reviewed: I have personally reviewed following labs and imaging studies  CBC: Recent Labs  Lab 02/14/18 0343 02/15/18 0316 02/16/18 0336 02/17/18 0359 02/18/18 0357  WBC 9.6 8.9 9.8 10.2 11.2*  NEUTROABS 9.0* 8.2* 8.9* 9.1* 10.2  HGB 8.8* 9.2* 9.2* 9.7* 9.8*  HCT 25.2* 27.4* 26.9* 29.3* 29.2*  MCV 90.0 91.6 90.6 91.6 91.8  PLT 239 284 340 383 563*   Basic Metabolic Panel: Recent Labs  Lab 02/14/18 0343 02/15/18 0316 02/16/18 0336 02/17/18 0359 02/18/18 0357  NA 136 137 137 136 137  K 4.2 4.6 4.2 4.0 4.1  CL 102 103 101 98* 99*  CO2 22 26 24 27 26   GLUCOSE 145* 134* 119* 85 115*  BUN 22* 18 15 12 14   CREATININE 0.55 0.44 0.42* 0.37* 0.42*  CALCIUM 9.3 9.3 9.1 9.0 8.8*  MG 2.2 2.1 1.9 1.8 1.8  PHOS 3.3 3.3 3.1 3.4 3.4   GFR: Estimated Creatinine Clearance: 58 mL/min (A) (by C-G formula based on SCr of 0.42  mg/dL (L)). Liver Function Tests: Recent Labs  Lab 02/14/18 0343 02/15/18 0316 02/16/18 0336 02/17/18 0359 02/18/18 0357  AST 82* 52* 36 32 29  ALT 90* 78* 65* 54 52  ALKPHOS 97 91 91 90 81  BILITOT 0.7 0.5 0.4 0.3 0.3  PROT 7.0 6.6 6.1* 6.6 6.3*  ALBUMIN 2.2* 2.1* 2.1* 2.2* 2.3*   No results for input(s): LIPASE, AMYLASE in the last 168 hours. No results for input(s): AMMONIA in the last 168 hours. Coagulation Profile: No results for input(s): INR, PROTIME in the last 168 hours. Cardiac Enzymes: No results for input(s): CKTOTAL, CKMB, CKMBINDEX, TROPONINI in the last 168 hours. BNP (last 3 results) No results for input(s): PROBNP in the last 8760 hours. HbA1C: No results for input(s): HGBA1C in the last 72 hours. CBG: Recent Labs  Lab 02/17/18 1230 02/17/18 1656 02/17/18 2100 02/18/18 0840 02/18/18 1202  GLUCAP 180* 162* 144* 79 132*   Lipid Profile: No results for input(s): CHOL, HDL, LDLCALC, TRIG, CHOLHDL, LDLDIRECT in the last 72 hours. Thyroid Function Tests: No results for input(s): TSH, T4TOTAL, FREET4, T3FREE, THYROIDAB in the last 72 hours. Anemia Panel: No results for input(s): VITAMINB12, FOLATE, FERRITIN, TIBC, IRON, RETICCTPCT in the last 72 hours. Sepsis Labs: Recent Labs  Lab 02/12/18 1704 02/12/18 2040 02/12/18 2042 02/12/18 2342 02/14/18 0343 02/15/18 0316 02/16/18 0336  PROCALCITON  --  2.78  --   --  1.68 0.95 0.51  LATICACIDVEN 2.17*  --  1.9 1.2  --   --   --     Recent Results (from the past 240 hour(s))  Blood Culture (routine x 2)     Status: None   Collection Time: 02/12/18  4:59 PM  Result Value Ref Range Status   Specimen Description   Final    BLOOD RIGHT ANTECUBITAL Performed at [facility], [place] [address] [place], [place] [address]    Special Requests   Final    BOTTLES DRAWN AEROBIC AND ANAEROBIC Blood Culture results may not be optimal due to an excessive volume of blood received in culture  bottles Performed at [facility] [address] [place], [place] [address]    Culture   Final    NO GROWTH 5 DAYS Performed at [facility], [place] [address], [place],  [address]    Report Status 02/17/2018 FINAL  Final  Blood Culture (routine x 2)     Status:  None   Collection Time: 02/12/18  4:59 PM  Result Value Ref Range Status   Specimen Description   Final    BLOOD LEFT ANTECUBITAL Performed at [facility] [address], [place], [place] [address]    Special Requests   Final    BOTTLES DRAWN AEROBIC AND ANAEROBIC Blood Culture adequate volume Performed at [facility] [address], [place], [place] [address]    Culture   Final    NO GROWTH 5 DAYS Performed at [facility], [place] [address] [place], [place] [address]    Report Status 02/17/2018 FINAL  Final  MRSA PCR Screening     Status: Abnormal   Collection Time: 02/12/18 11:42 PM  Result Value Ref Range Status   MRSA by PCR POSITIVE (A) NEGATIVE Final    Comment:        The GeneXpert MRSA Assay (FDA approved for NASAL specimens only), is one component of a comprehensive MRSA colonization surveillance program. It is not intended to diagnose MRSA infection nor to guide or monitor treatment for MRSA infections. RESULT CALLED TO, READ BACK BY AND VERIFIED WITH: Coletta Memos [id number] 02/13/18 MKELLY Performed at [facility], [place] [address], [place], [place] [address]     Radiology Studies: Dg Chest Port 1 View  Result Date: 02/18/2018 CLINICAL DATA:  67 year old female with a history of shortness of breath EXAM: PORTABLE CHEST 1 VIEW COMPARISON:  02/17/2018, 02/16/2018, CT 02/14/2018, CT 12/10/2017 FINDINGS: Cardiomediastinal silhouette unchanged in size and contour. Opacity in the right hilar region, similar to the comparison. Similar appearance of reticulonodular opacity of the left lung. No large pleural effusion. Low  lung volumes. No displaced fracture. Right posterior chest wall mass with destructive bony changes is better visualized on prior chest CT. IMPRESSION: Similar appearance of the chest x-ray, with reticulonodular opacity throughout the left lung, similar to prior. Differential includes edema, and/or infection, or potentially post treatment changes. Opacity in the right hilar region corresponds to mass on prior CT images. Electronically Signed   By: Corrie Mckusick D.O.   On: 02/18/2018 09:44   Dg Chest Port 1 View  Result Date: 02/17/2018 CLINICAL DATA:  Shortness of breath. EXAM: PORTABLE CHEST 1 VIEW COMPARISON:  Radiograph of February 16, 2018. FINDINGS: The heart size and mediastinal contours are within normal limits. Atherosclerosis of thoracic aorta is noted. No pneumothorax or pleural effusion is noted. Stable mild diffuse interstitial density seen in left lung consistent with inflammation or asymmetric edema. Stable minimal right basilar opacity is noted. The visualized skeletal structures are unremarkable. IMPRESSION: Stable mild diffuse interstitial left lung opacity is noted consistent with inflammation or asymmetric edema. Stable minimal right basilar opacity. Aortic Atherosclerosis (ICD10-I70.0). Electronically Signed   By: Marijo Conception, M.D.   On: 02/17/2018 07:21   Scheduled Meds: . amLODipine  5 mg Oral Daily  . arformoterol  15 mcg Nebulization BID  . Chlorhexidine Gluconate Cloth  6 each Topical Q0600  . dexamethasone  4 mg Oral BID WC  . enoxaparin (LOVENOX) injection  60 mg Subcutaneous Q12H  . feeding supplement (ENSURE ENLIVE)  237 mL Oral BID BM  . guaiFENesin  1,200 mg Oral BID  . insulin aspart  0-5 Units Subcutaneous QHS  . insulin aspart  0-9 Units Subcutaneous TID WC  . ipratropium  0.5 mg Nebulization BID  . levalbuterol  0.63 mg Nebulization BID  . pantoprazole  40 mg Oral Daily   Continuous Infusions: .  ceFEPime (MAXIPIME) IV 1 g (02/18/18 1445)    LOS: 6 days    Kerney Elbe, DO [facility] Pager [phone]  If 7PM-7AM, please contact night-coverage [email or web address] Password [email or web address] 02/18/2018, 4:05 PM

## 2018-02-19 ENCOUNTER — Inpatient Hospital Stay: Payer: Medicare HMO

## 2018-02-19 ENCOUNTER — Inpatient Hospital Stay (HOSPITAL_COMMUNITY): Payer: Medicare HMO

## 2018-02-19 ENCOUNTER — Inpatient Hospital Stay: Payer: Medicare HMO | Admitting: Oncology

## 2018-02-19 ENCOUNTER — Inpatient Hospital Stay: Payer: Medicare HMO | Attending: Internal Medicine | Admitting: Nutrition

## 2018-02-19 DIAGNOSIS — R630 Anorexia: Secondary | ICD-10-CM | POA: Insufficient documentation

## 2018-02-19 DIAGNOSIS — E46 Unspecified protein-calorie malnutrition: Secondary | ICD-10-CM | POA: Insufficient documentation

## 2018-02-19 DIAGNOSIS — Z7901 Long term (current) use of anticoagulants: Secondary | ICD-10-CM | POA: Insufficient documentation

## 2018-02-19 DIAGNOSIS — J449 Chronic obstructive pulmonary disease, unspecified: Secondary | ICD-10-CM | POA: Insufficient documentation

## 2018-02-19 DIAGNOSIS — C7931 Secondary malignant neoplasm of brain: Secondary | ICD-10-CM | POA: Insufficient documentation

## 2018-02-19 DIAGNOSIS — I82409 Acute embolism and thrombosis of unspecified deep veins of unspecified lower extremity: Secondary | ICD-10-CM | POA: Insufficient documentation

## 2018-02-19 DIAGNOSIS — Z5112 Encounter for antineoplastic immunotherapy: Secondary | ICD-10-CM | POA: Insufficient documentation

## 2018-02-19 DIAGNOSIS — Z79899 Other long term (current) drug therapy: Secondary | ICD-10-CM | POA: Insufficient documentation

## 2018-02-19 DIAGNOSIS — J9621 Acute and chronic respiratory failure with hypoxia: Secondary | ICD-10-CM

## 2018-02-19 DIAGNOSIS — R5383 Other fatigue: Secondary | ICD-10-CM | POA: Insufficient documentation

## 2018-02-19 DIAGNOSIS — Z923 Personal history of irradiation: Secondary | ICD-10-CM | POA: Insufficient documentation

## 2018-02-19 DIAGNOSIS — Z8701 Personal history of pneumonia (recurrent): Secondary | ICD-10-CM | POA: Insufficient documentation

## 2018-02-19 DIAGNOSIS — E860 Dehydration: Secondary | ICD-10-CM | POA: Insufficient documentation

## 2018-02-19 DIAGNOSIS — C3401 Malignant neoplasm of right main bronchus: Secondary | ICD-10-CM | POA: Insufficient documentation

## 2018-02-19 DIAGNOSIS — C7900 Secondary malignant neoplasm of unspecified kidney and renal pelvis: Secondary | ICD-10-CM | POA: Insufficient documentation

## 2018-02-19 DIAGNOSIS — M6281 Muscle weakness (generalized): Secondary | ICD-10-CM | POA: Insufficient documentation

## 2018-02-19 DIAGNOSIS — Z5111 Encounter for antineoplastic chemotherapy: Secondary | ICD-10-CM | POA: Insufficient documentation

## 2018-02-19 DIAGNOSIS — R634 Abnormal weight loss: Secondary | ICD-10-CM | POA: Insufficient documentation

## 2018-02-19 DIAGNOSIS — R000 Tachycardia, unspecified: Secondary | ICD-10-CM | POA: Insufficient documentation

## 2018-02-19 DIAGNOSIS — J9819 Other pulmonary collapse: Secondary | ICD-10-CM | POA: Insufficient documentation

## 2018-02-19 LAB — CBC WITH DIFFERENTIAL/PLATELET
BASOS PCT: 0 %
Basophils Absolute: 0 10*3/uL (ref 0.0–0.1)
EOS PCT: 0 %
Eosinophils Absolute: 0 10*3/uL (ref 0.0–0.7)
HCT: 29.5 % — ABNORMAL LOW (ref 36.0–46.0)
Hemoglobin: 9.8 g/dL — ABNORMAL LOW (ref 12.0–15.0)
LYMPHS ABS: 0.7 10*3/uL (ref 0.7–4.0)
Lymphocytes Relative: 7 %
MCH: 30.6 pg (ref 26.0–34.0)
MCHC: 33.2 g/dL (ref 30.0–36.0)
MCV: 92.2 fL (ref 78.0–100.0)
MONOS PCT: 5 %
Monocytes Absolute: 0.5 10*3/uL (ref 0.1–1.0)
Neutro Abs: 8.9 10*3/uL — ABNORMAL HIGH (ref 1.7–7.7)
Neutrophils Relative %: 88 %
PLATELETS: 440 10*3/uL — AB (ref 150–400)
RBC: 3.2 MIL/uL — ABNORMAL LOW (ref 3.87–5.11)
RDW: 15.3 % (ref 11.5–15.5)
WBC: 10.1 10*3/uL (ref 4.0–10.5)

## 2018-02-19 LAB — COMPREHENSIVE METABOLIC PANEL
ALT: 47 U/L (ref 14–54)
AST: 26 U/L (ref 15–41)
Albumin: 2.3 g/dL — ABNORMAL LOW (ref 3.5–5.0)
Alkaline Phosphatase: 79 U/L (ref 38–126)
Anion gap: 10 (ref 5–15)
BILIRUBIN TOTAL: 0.4 mg/dL (ref 0.3–1.2)
BUN: 15 mg/dL (ref 6–20)
CHLORIDE: 99 mmol/L — AB (ref 101–111)
CO2: 29 mmol/L (ref 22–32)
CREATININE: 0.38 mg/dL — AB (ref 0.44–1.00)
Calcium: 9.1 mg/dL (ref 8.9–10.3)
GFR calc Af Amer: >60 mL/min (ref >60)
Glucose, Bld: 95 mg/dL (ref 65–99)
POTASSIUM: 4.1 mmol/L (ref 3.5–5.1)
Sodium: 138 mmol/L (ref 135–145)
TOTAL PROTEIN: 6.5 g/dL (ref 6.5–8.1)

## 2018-02-19 LAB — RESPIRATORY PANEL BY PCR
Adenovirus: NOT DETECTED
BORDETELLA PERTUSSIS-RVPCR: NOT DETECTED
CHLAMYDOPHILA PNEUMONIAE-RVPPCR: NOT DETECTED
CORONAVIRUS NL63-RVPPCR: NOT DETECTED
Coronavirus 229E: NOT DETECTED
Coronavirus HKU1: NOT DETECTED
Coronavirus OC43: NOT DETECTED
Influenza A: NOT DETECTED
Influenza B: NOT DETECTED
MYCOPLASMA PNEUMONIAE-RVPPCR: NOT DETECTED
Metapneumovirus: NOT DETECTED
PARAINFLUENZA VIRUS 3-RVPPCR: NOT DETECTED
Parainfluenza Virus 1: NOT DETECTED
Parainfluenza Virus 2: NOT DETECTED
Parainfluenza Virus 4: NOT DETECTED
RESPIRATORY SYNCYTIAL VIRUS-RVPPCR: NOT DETECTED
RHINOVIRUS / ENTEROVIRUS - RVPPCR: NOT DETECTED

## 2018-02-19 LAB — GLUCOSE, CAPILLARY
GLUCOSE-CAPILLARY: 81 mg/dL (ref 65–99)
Glucose-Capillary: 206 mg/dL — ABNORMAL HIGH (ref 65–99)
Glucose-Capillary: 168 mg/dL — ABNORMAL HIGH (ref 65–99)
Glucose-Capillary: 119 mg/dL — ABNORMAL HIGH (ref 65–99)

## 2018-02-19 LAB — MAGNESIUM: MAGNESIUM: 1.9 mg/dL (ref 1.7–2.4)

## 2018-02-19 LAB — PHOSPHORUS: PHOSPHORUS: 3.2 mg/dL (ref 2.5–4.6)

## 2018-02-19 LAB — PROCALCITONIN: PROCALCITONIN: 0.1 ng/mL

## 2018-02-19 MED ORDER — POLYETHYLENE GLYCOL 3350 17 G PO PACK
17.0000 g | PACK | Freq: Once | ORAL | Status: AC
  Administered 2018-02-19: 17 g via ORAL
  Filled 2018-02-19: qty 1

## 2018-02-19 NOTE — Progress Notes (Signed)
PULMONARY / CRITICAL CARE MEDICINE   Name: Beth Alexander MRN: [id number] DOB: [birth year]    ADMISSION DATE:  02/12/2018 CONSULTATION DATE:  4/30  REFERRING MD:  Alfredia Ferguson   CHIEF COMPLAINT:  Acute hypoxic respiratory failure  HISTORY OF PRESENT ILLNESS:    This is a 67 year old former smoker w/ dx of NSCLC stage IV (squamous cell), presented w/ right hilar mass and RUL collapse. She is s/p radiation to the brain and xrt to RUL completed 01/17/18. Most recently completed cycle 1, day 1 of carboplatin, paclitaxel and Kayruda on 01/29/18. Seen in oncology office on 4/22 w/ cc: weakness and non-productive cough. Had recent exposure to a son who had URI symptoms. She stated her symptoms started about 3 days after exposure to her son. She presented to the emergency room on 4/24 with chief complaint of weakness, chills, confusion, and nonproductive cough.  She was found by her sister this state when she was scheduled to be picked up for in the emergency room toxic breather chest x-ray reason for pneumonia she was admitted to medicine service.  To date she has been treated with IV antibiotics, she is now down to 2 L nasal cannula, subsequent chest x-ray films improving.  However she is not yet back to room air.  Pulmonary asked to see to make further recommendations in regards to treatment of pneumonia/hypoxic respiratory failure.  SUBJECTIVE:  Feels better  VITAL SIGNS: Blood Pressure 127/82 (BP Location: Left Arm)   Pulse 87   Temperature (Abnormal) 97.4 F (36.3 C) (Axillary)   Respiration 15   Height 5\' 3"  (1.6 m)   Weight 126 lb 5.2 oz (57.3 kg)   Oxygen Saturation 100%   Body Mass Index 22.38 kg/m  Currently on 8 L high flow HEMODYNAMICS:    VENTILATOR SETTINGS:    INTAKE / OUTPUT: I/O last 3 completed shifts: In: 5 [P.O.:840; IV Piggyback:400] Out: 2100 [Urine:2100]  PHYSICAL EXAMINATION: General: 67 year old American female resting comfortably in bed she is in no acute  distress HEENT: Normocephalic atraumatic no jugular venous distention mucous membranes are moist Pulmonary: Left greater than right rales, no accessory use.  Remains on 8 L high flow oxygen however saturations are 100% Cardiac: Regular rate and rhythm Abdomen: Soft nontender no organomegaly Extremities: No significant edema brisk cap refill strong pulses. Neuro: Awake and oriented no focal deficits  LABS:  BMET Recent Labs  Lab 02/17/18 0359 02/18/18 0357 02/19/18 0652  NA 136 137 138  K 4.0 4.1 4.1  CL 98* 99* 99*  CO2 27 26 29   BUN 12 14 15   CREATININE 0.37* 0.42* 0.38*  GLUCOSE 85 115* 95    Electrolytes Recent Labs  Lab 02/17/18 0359 02/18/18 0357 02/19/18 0652  CALCIUM 9.0 8.8* 9.1  MG 1.8 1.8 1.9  PHOS 3.4 3.4 3.2    CBC Recent Labs  Lab 02/17/18 0359 02/18/18 0357 02/19/18 0652  WBC 10.2 11.2* 10.1  HGB 9.7* 9.8* 9.8*  HCT 29.3* 29.2* 29.5*  PLT 383 411* 440*    Coag's No results for input(s): APTT, INR in the last 168 hours.  Sepsis Markers Recent Labs  Lab 02/12/18 1704  02/12/18 2042 02/12/18 2342 02/14/18 0343 02/15/18 0316 02/16/18 0336  LATICACIDVEN 2.17*  --  1.9 1.2  --   --   --   PROCALCITON  --    < >  --   --  1.68 0.95 0.51   < > = values in this interval not displayed.  ABG Recent Labs  Lab 02/12/18 2220  PHART 7.397  PCO2ART 40.0  PO2ART 151*    Liver Enzymes Recent Labs  Lab 02/17/18 0359 02/18/18 0357 02/19/18 0652  AST 32 29 26  ALT 54 52 47  ALKPHOS 90 81 79  BILITOT 0.3 0.3 0.4  ALBUMIN 2.2* 2.3* 2.3*    Cardiac Enzymes No results for input(s): TROPONINI, PROBNP in the last 168 hours.  Glucose Recent Labs  Lab 02/17/18 1230 02/17/18 1656 02/17/18 2100 02/18/18 0840 02/18/18 1202 02/18/18 1642  GLUCAP 180* 162* 144* 79 132* 177*    Imaging Dg Chest Port 1 View  Result Date: 02/19/2018 CLINICAL DATA:  Shortness of breath. History of lung malignancy, sepsis, former smoker. EXAM: PORTABLE  CHEST 1 VIEW COMPARISON:  Chest x-ray of February 18, 2018 FINDINGS: The right hemidiaphragm is higher than the left and the right lung is hypoinflated. There is persistent right hilar density consistent with a known mass. The interstitial markings of the right lung are coarse. On the left diffusely increased lung markings are present. There is no confluent infiltrate. There is no pleural effusion. The heart and pulmonary vascularity are normal. The mediastinum is normal in width. IMPRESSION: Slight increase conspicuity of interstitial markings in the right lung accentuated by elevation of the hemidiaphragm. Persistent diffusely increased lung markings on the left. Overall there has not been dramatic interval change. Electronically Signed   By: David  Martinique M.D.   On: 02/19/2018 09:50     STUDIES:  Echocardiogram completed on 4/27:Demonstrates ejection fraction 55 to 60%.  Wall motion was normal.  Doppler parameters were consistent with grade 1 diastolic dysfunction.  Mild tricuspid regurg.  Estimated pulmonary artery systolic pressure was 39 mmHg CULTURES: 4/24 MRSA PCR was positive 4/24 blood cultures Negative 4/24 urine strep antigen negative 4/24 urine Legionella negative 4/24 influenza panel negative ANTIBIOTICS: Cefepime started 4/24 Vancomycin started 4/24 stopped 4/30   ASSESSMENT / PLAN:  Acute hypoxic respiratory failure in the setting of bilateral pulmonary infiltrates consistent with probable pneumonia.  Superimposed on underlying stage IV non-small cell lung cancer. -Given radiographic findings wonder more about viral pneumonitis over actual bacterial process however agree with treatment empirically for hcap -Additionally could include drug-induced pneumonitis on differential diagnosis however given leading events doubt this is the case.  Unlikely this is radiation related given clinical course of events and left side predominance -Clinically and radiographically improving -Portable  chest x-ray reviewed four 5/1 this demonstrates some improved aeration bilateral airspace disease does however persist with left greater than right airspace disease. Plan Repeat procalcitonin Has completed 7 days of vancomycin, is now on day #8 Maxipime Procalcitonin is negative I would discontinue Maxipime as well Continue to wean oxygen Follow-up respiratory viral panel Walking oximetry Will likely need to go home on oxygen but if this is indeed viral should be self-limiting We will be available as needed  Erick Colace ACNP-BC [facility] Pager # [phone] OR # [phone] if no answer  02/19/2018, 10:08 AM

## 2018-02-19 NOTE — Progress Notes (Signed)
SBAR REPORT RECEIVED FROM Assunta Found, RN; CARE ASSUMED. PATIENT SITTING IN ROOM BED DOING WORD SEARCH PUZZLES. NO DISTRESS NOTED; DENIES PAIN OR NEEDS AT THIS TIME DISCUSSION OF THIS AFTERNOONS POC WITH ALLOWANCE FOR ALL QUESTIONS TO BE ASKED AND ANSWERED. DAUGHTER AT BEDSIDE.

## 2018-02-19 NOTE — Progress Notes (Signed)
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   She was placed on antibiotics by her PCP which she took for about 2 days but presented to the ER she did not improve.  Lactic acid was 2.17- Temperature in the ED was 99 degrees.  Heart rate in the 1 teens and respiratory rate in the high 20s and 30s  Chest x-ray suggested left-sided infiltrates and therefore she was started on treatment for healthcare associated pneumonia causing acute respiratory failure. 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 She admits to being constipated.   Assessment & Plan:   Principal Problem:   Sepsis / Acute respiratory failure with hypoxia/left pulmonary infiltrates -Tachycardia and tachypnea have improved- lactic acidosis has resolved - -Lactic acid level on admission was 2.17 as well as procalcitonin was 2.78.  Lactic acid level is improved to 1.2 now Procalcitonin is trended down to 0.51 suggesting that they may have been a bacterial infection -Strep and Legionella antigens negative -Influenza negative -MRSA PCR + -Blood cultures x2- -Patient was on Zithromax as outpatient recently for an upper respiratory tract infection -Treated for healthcare acquired pneumonia after being hospitalized but has been slow to improve with  ongoing hypoxia with about 8 to 10 L of oxygen requirement and persistent left-sided interstitial infiltrate -diuretics have been unhelpful-BNP was only 55 -pulmonary critical care consulted on 4/30-respiratory virus panel ordered -She has been on Decadron taper for her metastasis and per pulmonary this should be sufficient to treat pneumonitis secondary to medication - Vanco and cefepime switched to cefepime only -Follow-up on respiratory virus panel   Active Problems:  Acute DVT left leg On 4/26 venous duplex of the lower extremity also showed an acute left leg DVT in  External Iliac vein, Common Femoral vein, Peroneal veins, Soleal vein, and Gastrocnemius vein.  -   occurring in setting of metastatic cancer -Has been started on Lovenox   Acute toxic encephalopathy -Secondary to respiratory failure and sepsis-has resolved    Squamous cell carcinoma of right lung stage IV with extensive metastasis -On chemotherapy status post first cycle on 4/20 -Status post radiation to brain metastasis and radiation to the right upper lobe which was completed on 3/29 -Quit smoking about 2 to 3 months ago  Diabetes mellitus type 2 -A1c 6.7-continue sensitive sliding scale insulin  Mildly elevated LFTs -Mildly elevated LFTs with an AST of 82 and ALT of 90-have normalized now and may have been related to sepsis -Noted to have fatty liver on right upper quadrant ultrasound -Acute hepatitis panel  Constipation -MiraLAX  Anemia of chronic disease  Ref. Range 02/14/2018 08:07  Iron Latest Ref Range: 28 - 170 ug/dL 44  UIBC Latest Units: ug/dL 130  TIBC Latest Ref Range: 250 - 450 ug/dL 174 (L)  Saturation Ratios Latest Ref  Range: 10.4 - 31.8 % 25  Ferritin Latest Ref Range: 11 - 307 ng/mL 1,237 (H)  Folate Latest Ref Range: >5.9 ng/mL 9.7  Vitamin B12 Latest Ref Range: 180 - 914 pg/mL 1,634 (H)   Hypertension -On amlodipine    DVT prophylaxis: Full dose Lovenox Code Status: Full code    Family Communication:  Disposition Plan: Continue to follow in stepdown unit due to O2 requirements Consultants:   Pulmonary critical care Procedures:   2D echo 4/27 Study Conclusions  - Left ventricle: The cavity size was normal. There was mild   concentric hypertrophy. Systolic function was normal. The   estimated ejection fraction was in the range of 55% to 60%. Wall   motion was normal; there were no regional wall motion   abnormalities. Doppler parameters are consistent with abnormal   left ventricular relaxation (grade 1 diastolic dysfunction).   There was no evidence of elevated ventricular filling pressure by   Doppler parameters. - Aortic root: The aortic root was normal in size. - Mitral valve: There was trivial regurgitation. - Right ventricle: Systolic function was normal. - Right atrium: The atrium was normal in size. - Tricuspid valve: There was mild regurgitation. - Pulmonic valve: There was trivial regurgitation. - Pulmonary arteries: Systolic pressure was mildly increased. PA   peak pressure: 39 mm Hg (S). - Inferior vena cava: The vessel was normal in size. - Pericardium, extracardiac: There was no pericardial effusion.  Lower extremity venous duplex left leg 4/26 Left: There is evidence of acute DVT in the External Iliac vein, Common Femoral vein, Peroneal veins, Soleal vein, and Gastrocnemius vein. There is no evidence of superficial venous thrombosis.  Antimicrobials:  Anti-infectives (From admission, onward)   Start     Dose/Rate Route Frequency Ordered Stop   02/13/18 1000  vancomycin (VANCOCIN) IVPB 1000 mg/200 mL premix  Status:  Discontinued     1,000 mg 200 mL/hr over 60 Minutes Intravenous Every 24 hours 02/12/18 2051 02/17/18 1346   02/13/18 0000  ceFEPIme (MAXIPIME) 1 g in sodium chloride 0.9 % 100 mL IVPB     1 g 200 mL/hr over 30 Minutes Intravenous Every 8 hours 02/12/18 2041 02/20/18 2159   02/12/18 1700  piperacillin-tazobactam (ZOSYN)  IVPB 3.375 g     3.375 g 100 mL/hr over 30 Minutes Intravenous  Once 02/12/18 1647 02/12/18 1745   02/12/18 1700  vancomycin (VANCOCIN) IVPB 1000 mg/200 mL premix     1,000 mg 200 mL/hr over 60 Minutes Intravenous  Once 02/12/18 1647 02/12/18 1906       Objective: Vitals:   02/19/18 0400 02/19/18 0800 02/19/18 1000 02/19/18 1200  BP: 136/80 127/82 (!) 103/42 104/74  Pulse: 84 87 (!) 109 98  Resp: 14 15 (!) 22 19  Temp:  (!) 97.4 F (36.3 C)  97.6 F (36.4 C)  TempSrc:  Axillary  Axillary  SpO2: 98% 100% 99% 100%  Weight:      Height:        Intake/Output Summary (Last 24 hours) at 02/19/2018 1341 Last data filed at 02/19/2018 1200 Gross per 24 hour  Intake 1400 ml  Output 2100 ml  Net -700 ml   Filed Weights   02/12/18 1621 02/14/18 0425  Weight: 55.8 kg (123 lb) 57.3 kg (126 lb 5.2 oz)    Examination: General exam: Appears comfortable  HEENT: PERRLA, oral mucosa moist, no sclera icterus or thrush Respiratory system: Decreased breath sounds in the left mid and lower lung field-pulse ox is  97% on 8 L of oxygen noted to have a congested cough Cardiovascular system: S1 & S2 heard, RRR.   Gastrointestinal system: Abdomen soft, non-tender, nondistended. Normal bowel sound. No organomegaly Central nervous system: Alert and oriented. No focal neurological deficits. Extremities: No cyanosis, clubbing or edema Skin: No rashes or ulcers Psychiatry:  Mood & affect appropriate.     Data Reviewed: I have personally reviewed following labs and imaging studies  CBC: Recent Labs  Lab 02/15/18 0316 02/16/18 0336 02/17/18 0359 02/18/18 0357 02/19/18 0652  WBC 8.9 9.8 10.2 11.2* 10.1  NEUTROABS 8.2* 8.9* 9.1* 10.2 8.9*  HGB 9.2* 9.2* 9.7* 9.8* 9.8*  HCT 27.4* 26.9* 29.3* 29.2* 29.5*  MCV 91.6 90.6 91.6 91.8 92.2  PLT 284 340 383 411* 401*   Basic Metabolic Panel: Recent Labs  Lab 02/15/18 0316 02/16/18 0336 02/17/18 0359 02/18/18 0357 02/19/18 0652  NA 137 137 136  137 138  K 4.6 4.2 4.0 4.1 4.1  CL 103 101 98* 99* 99*  CO2 26 24 27 26 29   GLUCOSE 134* 119* 85 115* 95  BUN 18 15 12 14 15   CREATININE 0.44 0.42* 0.37* 0.42* 0.38*  CALCIUM 9.3 9.1 9.0 8.8* 9.1  MG 2.1 1.9 1.8 1.8 1.9  PHOS 3.3 3.1 3.4 3.4 3.2   GFR: Estimated Creatinine Clearance: 58 mL/min (A) (by C-G formula based on SCr of 0.38 mg/dL (L)). Liver Function Tests: Recent Labs  Lab 02/15/18 0316 02/16/18 0336 02/17/18 0359 02/18/18 0357 02/19/18 0652  AST 52* 36 32 29 26  ALT 78* 65* 54 52 47  ALKPHOS 91 91 90 81 79  BILITOT 0.5 0.4 0.3 0.3 0.4  PROT 6.6 6.1* 6.6 6.3* 6.5  ALBUMIN 2.1* 2.1* 2.2* 2.3* 2.3*   No results for input(s): LIPASE, AMYLASE in the last 168 hours. No results for input(s): AMMONIA in the last 168 hours. Coagulation Profile: No results for input(s): INR, PROTIME in the last 168 hours. Cardiac Enzymes: No results for input(s): CKTOTAL, CKMB, CKMBINDEX, TROPONINI in the last 168 hours. BNP (last 3 results) No results for input(s): PROBNP in the last 8760 hours. HbA1C: No results for input(s): HGBA1C in the last 72 hours. CBG: Recent Labs  Lab 02/18/18 0840 02/18/18 1202 02/18/18 1642 02/19/18 0826 02/19/18 1150  GLUCAP 79 132* 177* 81 206*   Lipid Profile: No results for input(s): CHOL, HDL, LDLCALC, TRIG, CHOLHDL, LDLDIRECT in the last 72 hours. Thyroid Function Tests: No results for input(s): TSH, T4TOTAL, FREET4, T3FREE, THYROIDAB in the last 72 hours. Anemia Panel: No results for input(s): VITAMINB12, FOLATE, FERRITIN, TIBC, IRON, RETICCTPCT in the last 72 hours. Urine analysis:    Component Value Date/Time   COLORURINE AMBER (A) 02/12/2018 2042   APPEARANCEUR CLOUDY (A) 02/12/2018 2042   LABSPEC 1.028 02/12/2018 2042   PHURINE 5.0 02/12/2018 2042   GLUCOSEU NEGATIVE 02/12/2018 2042   HGBUR NEGATIVE 02/12/2018 2042   BILIRUBINUR NEGATIVE 02/12/2018 2042   KETONESUR 5 (A) 02/12/2018 2042   PROTEINUR 100 (A) 02/12/2018 2042    NITRITE NEGATIVE 02/12/2018 2042   LEUKOCYTESUR TRACE (A) 02/12/2018 2042   Sepsis Labs: @LABRCNTIP (procalcitonin:4,lacticidven:4) ) Recent Results (from the past 240 hour(s))  Blood Culture (routine x 2)     Status: None   Collection Time: 02/12/18  4:59 PM  Result Value Ref Range Status   Specimen Description   Final    BLOOD RIGHT ANTECUBITAL Performed at [facility], [place] [address], [place], [place] [address]    Special  Requests   Final    BOTTLES DRAWN AEROBIC AND ANAEROBIC Blood Culture results may not be optimal due to an excessive volume of blood received in culture bottles Performed at [facility] [address], [place], [place] [address]    Culture   Final    NO GROWTH 5 DAYS Performed at [facility], [place] [address] [place], [place] [address]    Report Status 02/17/2018 FINAL  Final  Blood Culture (routine x 2)     Status: None   Collection Time: 02/12/18  4:59 PM  Result Value Ref Range Status   Specimen Description   Final    BLOOD LEFT ANTECUBITAL Performed at [facility] [address] [place], [place] [address]    Special Requests   Final    BOTTLES DRAWN AEROBIC AND ANAEROBIC Blood Culture adequate volume Performed at [place] [address], [place], [place] [address]    Culture   Final    NO GROWTH 5 DAYS Performed at [facility], [place] [address] [place], [place] [address]    Report Status 02/17/2018 FINAL  Final  MRSA PCR Screening     Status: Abnormal   Collection Time: 02/12/18 11:42 PM  Result Value Ref Range Status   MRSA by PCR POSITIVE (A) NEGATIVE Final    Comment:        The GeneXpert MRSA Assay (FDA approved for NASAL specimens only), is one component of a comprehensive MRSA colonization surveillance program. It is not intended to diagnose MRSA infection nor to guide or monitor treatment for MRSA infections. RESULT CALLED TO, READ BACK  BY AND VERIFIED WITH: Coletta Memos [id number] 02/13/18 MKELLY Performed at [facility], [place] [address], [place],  [address]          Radiology Studies: Dg Chest Port 1 View  Result Date: 02/19/2018 CLINICAL DATA:  Shortness of breath. History of lung malignancy, sepsis, former smoker. EXAM: PORTABLE CHEST 1 VIEW COMPARISON:  Chest x-ray of February 18, 2018 FINDINGS: The right hemidiaphragm is higher than the left and the right lung is hypoinflated. There is persistent right hilar density consistent with a known mass. The interstitial markings of the right lung are coarse. On the left diffusely increased lung markings are present. There is no confluent infiltrate. There is no pleural effusion. The heart and pulmonary vascularity are normal. The mediastinum is normal in width. IMPRESSION: Slight increase conspicuity of interstitial markings in the right lung accentuated by elevation of the hemidiaphragm. Persistent diffusely increased lung markings on the left. Overall there has not been dramatic interval change. Electronically Signed   By: David  Martinique M.D.   On: 02/19/2018 09:50   Dg Chest Port 1 View  Result Date: 02/18/2018 CLINICAL DATA:  67 year old female with a history of shortness of breath EXAM: PORTABLE CHEST 1 VIEW COMPARISON:  02/17/2018, 02/16/2018, CT 02/14/2018, CT 12/10/2017 FINDINGS: Cardiomediastinal silhouette unchanged in size and contour. Opacity in the right hilar region, similar to the comparison. Similar appearance of reticulonodular opacity of the left lung. No large pleural effusion. Low lung volumes. No displaced fracture. Right posterior chest wall mass with destructive bony changes is better visualized on prior chest CT. IMPRESSION: Similar appearance of the chest x-ray, with reticulonodular opacity throughout the left lung, similar to prior. Differential includes edema, and/or infection, or potentially post treatment changes. Opacity in the right hilar  region corresponds to mass on prior CT images. Electronically Signed  By: Corrie Mckusick D.O.   On: 02/18/2018 09:44      Scheduled Meds: . amLODipine  5 mg Oral Daily  . arformoterol  15 mcg Nebulization BID  . dexamethasone  4 mg Oral BID WC  . enoxaparin (LOVENOX) injection  60 mg Subcutaneous Q12H  . feeding supplement (ENSURE ENLIVE)  237 mL Oral BID BM  . guaiFENesin  1,200 mg Oral BID  . insulin aspart  0-5 Units Subcutaneous QHS  . insulin aspart  0-9 Units Subcutaneous TID WC  . ipratropium  0.5 mg Nebulization BID  . levalbuterol  0.63 mg Nebulization BID  . pantoprazole  40 mg Oral Daily   Continuous Infusions: . ceFEPime (MAXIPIME) IV 1 g (02/19/18 0515)     LOS: 7 days    Time spent in minutes: 40 minutes    Debbe Odea, MD [facility] Pager: [email or web address] Password [email or web address] 02/19/2018, 1:41 PM

## 2018-02-20 DIAGNOSIS — J9601 Acute respiratory failure with hypoxia: Secondary | ICD-10-CM

## 2018-02-20 DIAGNOSIS — E440 Moderate protein-calorie malnutrition: Secondary | ICD-10-CM

## 2018-02-20 LAB — GLUCOSE, CAPILLARY
GLUCOSE-CAPILLARY: 89 mg/dL (ref 65–99)
Glucose-Capillary: 216 mg/dL — ABNORMAL HIGH (ref 65–99)
Glucose-Capillary: 175 mg/dL — ABNORMAL HIGH (ref 65–99)
Glucose-Capillary: 203 mg/dL — ABNORMAL HIGH (ref 65–99)

## 2018-02-20 LAB — PROCALCITONIN

## 2018-02-20 MED ORDER — CHLORHEXIDINE GLUCONATE CLOTH 2 % EX PADS
6.0000 | MEDICATED_PAD | Freq: Every day | CUTANEOUS | Status: AC
  Administered 2018-02-20 – 2018-02-21 (×2): 6 via TOPICAL

## 2018-02-20 MED ORDER — ADULT MULTIVITAMIN W/MINERALS CH
1.0000 | ORAL_TABLET | Freq: Every day | ORAL | Status: DC
  Administered 2018-02-20 – 2018-02-26 (×7): 1 via ORAL
  Filled 2018-02-20 (×6): qty 1

## 2018-02-20 MED ORDER — MUPIROCIN 2 % EX OINT
1.0000 "application " | TOPICAL_OINTMENT | Freq: Two times a day (BID) | CUTANEOUS | Status: AC
  Administered 2018-02-20 – 2018-02-24 (×10): 1 via NASAL
  Filled 2018-02-20: qty 22

## 2018-02-20 NOTE — Progress Notes (Signed)
PULMONARY / CRITICAL CARE MEDICINE   Name: Beth Alexander MRN: [id number] DOB: 06/11/1951    ADMISSION DATE:  02/12/2018 CONSULTATION DATE:  4/30  REFERRING MD:  Marland Mcalpine   CHIEF COMPLAINT:  Acute hypoxic respiratory failure  HISTORY OF PRESENT ILLNESS:    This is a 67 year old former smoker w/ dx of NSCLC stage IV (squamous cell), presented w/ right hilar mass and RUL collapse. She is s/p radiation to the brain and xrt to RUL completed 01/17/18. Most recently completed cycle 1, day 1 of carboplatin, paclitaxel and Kayruda on 01/29/18. Seen in oncology office on 4/22 w/ cc: weakness and non-productive cough. Had recent exposure to a son who had URI symptoms. She stated her symptoms started about 3 days after exposure to her son. She presented to the emergency room on 4/24 with chief complaint of weakness, chills, confusion, and nonproductive cough.  She was found by her sister this state when she was scheduled to be picked up for in the emergency room toxic breather chest x-ray reason for pneumonia she was admitted to medicine service.  To date she has been treated with IV antibiotics, she is now down to 2 L nasal cannula, subsequent chest x-ray films improving.  However she is not yet back to room air.  Pulmonary asked to see to make further recommendations in regards to treatment of pneumonia/hypoxic respiratory failure.  SUBJECTIVE:  No acute distress at rest  VITAL SIGNS: BP (!) 144/79 (BP Location: Right Arm)   Pulse 89   Temp 97.6 F (36.4 C) (Oral)   Resp 17   Ht 5\' 3"  (1.6 m)   Wt 57.3 kg (126 lb 5.2 oz)   SpO2 95%   BMI 22.38 kg/m  Currently on 8 L high flow HEMODYNAMICS:    VENTILATOR SETTINGS:    INTAKE / OUTPUT: I/O last 3 completed shifts: In: 1610 [P.O.:1060; IV Piggyback:550] Out: 2800 [Urine:2800]  PHYSICAL EXAMINATION: General: Frail elderly female no acute distress HEENT: JVD or lymphadenopathy is appreciated Neuro: Dull effect slow to respond to verbal  request CV: s1s2 rrr, no m/r/g PULM: Nonlabored decreased breath sounds at the bases WU:JWJX, non-tender, bsx4 active  Extremities: warm/dry, negative edema  Skin: no rashes or lesions   LABS:  BMET Recent Labs  Lab 02/17/18 0359 02/18/18 0357 02/19/18 0652  NA 136 137 138  K 4.0 4.1 4.1  CL 98* 99* 99*  CO2 27 26 29   BUN 12 14 15   CREATININE 0.37* 0.42* 0.38*  GLUCOSE 85 115* 95    Electrolytes Recent Labs  Lab 02/17/18 0359 02/18/18 0357 02/19/18 0652  CALCIUM 9.0 8.8* 9.1  MG 1.8 1.8 1.9  PHOS 3.4 3.4 3.2    CBC Recent Labs  Lab 02/17/18 0359 02/18/18 0357 02/19/18 0652  WBC 10.2 11.2* 10.1  HGB 9.7* 9.8* 9.8*  HCT 29.3* 29.2* 29.5*  PLT 383 411* 440*    Coag's No results for input(s): APTT, INR in the last 168 hours.  Sepsis Markers Recent Labs  Lab 02/16/18 0336 02/19/18 0652 02/20/18 0321  PROCALCITON 0.51 0.10 <0.10    ABG No results for input(s): PHART, PCO2ART, PO2ART in the last 168 hours.  Liver Enzymes Recent Labs  Lab 02/17/18 0359 02/18/18 0357 02/19/18 0652  AST 32 29 26  ALT 54 52 47  ALKPHOS 90 81 79  BILITOT 0.3 0.3 0.4  ALBUMIN 2.2* 2.3* 2.3*    Cardiac Enzymes No results for input(s): TROPONINI, PROBNP in the last 168 hours.  Glucose  Recent Labs  Lab 02/18/18 1642 02/19/18 0826 02/19/18 1150 02/19/18 1622 02/19/18 2112 02/20/18 0720  GLUCAP 177* 81 206* 168* 119* 89    Imaging No results found.   STUDIES:  Echocardiogram completed on 4/27:Demonstrates ejection fraction 55 to 60%.  Wall motion was normal.  Doppler parameters were consistent with grade 1 diastolic dysfunction.  Mild tricuspid regurg.  Estimated pulmonary artery systolic pressure was 39 mmHg CULTURES: 4/24 MRSA PCR was positive 4/24 blood cultures Negative 4/24 urine strep antigen negative 4/24 urine Legionella negative 4/24 influenza panel negative 02/20/2018 respiratory virus panel was negative 02/19/2018 procalcitonin  0.10 ANTIBIOTICS: Cefepime started 4/24 Vancomycin started 4/24 stopped 4/30   ASSESSMENT / PLAN:  Acute hypoxic respiratory failure in the setting of bilateral pulmonary infiltrates consistent with probable pneumonia.  Superimposed on underlying stage IV non-small cell lung cancer. -Given radiographic findings wonder more about viral pneumonitis over actual bacterial process however agree with treatment empirically for hcap -Additionally could include drug-induced pneumonitis on differential diagnosis however given leading events doubt this is the case.  Unlikely this is radiation related given clinical course of events and left side predominance -Clinically and radiographically improving -Portable chest x-ray reviewed four 5/1 this demonstrates some improved aeration bilateral airspace disease does however persist with left greater than right airspace disease.  Plan Procalcitonin was less than 0.10 FiO2 needs are decreasing she is down to 4 L nasal cannula with O2 sats of 94% She is in no respiratory distress whatsoever. Remains on cefepime day 7 although this could most likely be discontinued Story virus panel is negative for 02/20/2018 Wean oxygen as tolerated May need home O2. PCCM will sign off as of 02/20/2018  Brett Canales Kaylena Pacifico ACNP [facility] PCCM Pager [phone] till 1 pm If no answer page [phone] 02/20/2018, 9:17 AM   02/20/2018, 9:15 AM

## 2018-02-20 NOTE — Progress Notes (Signed)
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   She was placed on antibiotics by her PCP which she took for about 2 days but presented to the ER she did not improve.  Lactic acid was 2.17- Temperature in the ED was 99 degrees.  Heart rate in the 1 teens and respiratory rate in the high 20s and 30s  Chest x-ray suggested left-sided infiltrates and therefore she was started on treatment for healthcare associated pneumonia causing acute respiratory failure. CTA of the chest obtained after admission revealed the following: Extensive infiltrates throughout the left lung Decreased size of right hilar mass with new patency of the right upper lobe bronchus and reinflation of the right upper lobe Metastatic disease in the right kidney right adrenal glands and right posterior chest wall   Subjective: She feels about the same and tells me she wants to go home. Per RN, she is a 2 person assist and becomes hypoxic just when standing by the bedside. She declines going to SNF for rehab. I have spoken to he sister who will look into private home care. I have also discussed code status with the patient and she does not want resuscitation or intubation. I have communicated this to her sister as well.   Assessment & Plan:   Principal Problem:   Sepsis / Acute respiratory failure with hypoxia/left pulmonary infiltrates -Tachycardia and tachypnea have improved- lactic acidosis has resolved - -Lactic acid level on admission was 2.17 as well as procalcitonin was 2.78.  Lactic acid level is improved to 1.2 now Procalcitonin is trended down to 0.51 suggesting that they may have been a bacterial infection -Strep and Legionella antigens  negative -Influenza negative -MRSA PCR + -Blood cultures x2- -Patient was on Zithromax as outpatient recently for an upper respiratory tract infection -Treated for healthcare acquired pneumonia after being hospitalized but has been slow to improve with ongoing hypoxia with about 8 to 10 L of oxygen requirement and persistent left-sided interstitial infiltrate -diuretics have been unhelpful-BNP was only 55 -pulmonary critical care consulted on 4/30-respiratory virus panel ordered -She has been on Decadron taper for her metastasis and per pulmonary this should be sufficient to treat pneumonitis secondary to medication - Vanco and cefepime switched to cefepime only  - respiratory virus panel neg- pulm has stopped antibiotics - she has been weaned to 2 L O2-   Active Problems:  Acute DVT left leg On 4/26 venous duplex of the lower extremity also showed an acute left leg DVT in  External Iliac vein, Common Femoral vein, Peroneal veins, Soleal vein, and Gastrocnemius vein.  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 -On chemotherapy status post first cycle on 4/20 -Status post radiation to brain metastasis and radiation to the right upper lobe which was completed on 3/29 -Quit smoking about 2 to 3 months ago  Diabetes mellitus type 2 -A1c 6.7-continue sensitive sliding scale insulin  Mildly elevated LFTs -Mildly elevated LFTs with an AST of 82 and ALT of 90-have normalized now and may have been related to sepsis -Noted to have fatty liver on right upper quadrant ultrasound -Acute hepatitis panel  Constipation -MiraLAX  Anemia of chronic disease  Ref. Range 02/14/2018 08:07  Iron  Latest Ref Range: 28 - 170 ug/dL 44  UIBC Latest Units: ug/dL 130  TIBC Latest Ref Range: 250 - 450 ug/dL 174 (L)  Saturation Ratios Latest Ref Range: 10.4 - 31.8 % 25  Ferritin Latest Ref Range: 11 - 307 ng/mL 1,237 (H)  Folate Latest Ref Range: >5.9 ng/mL 9.7  Vitamin B12 Latest Ref Range: 180 - 914 pg/mL 1,634 (H)   Hypertension -On amlodipine    DVT prophylaxis: Full dose Lovenox Code Status: DNR- will need to go home with gold DNR form Family Communication: sister [name] Disposition Plan: Continue to follow in stepdown unit due to O2 requirements Consultants:   Pulmonary critical care Procedures:   2D echo 4/27 Study Conclusions  - Left ventricle: The cavity size was normal. There was mild   concentric hypertrophy. Systolic function was normal. The   estimated ejection fraction was in the range of 55% to 60%. Wall   motion was normal; there were no regional wall motion   abnormalities. Doppler parameters are consistent with abnormal   left ventricular relaxation (grade 1 diastolic dysfunction).   There was no evidence of elevated ventricular filling pressure by   Doppler parameters. - Aortic root: The aortic root was normal in size. - Mitral valve: There was trivial regurgitation. - Right ventricle: Systolic function was normal. - Right atrium: The atrium was normal in size. - Tricuspid valve: There was mild regurgitation. - Pulmonic valve: There was trivial regurgitation. - Pulmonary arteries: Systolic pressure was mildly increased. PA   peak pressure: 39 mm Hg (S). - Inferior vena cava: The vessel was normal in size. - Pericardium, extracardiac: There was no pericardial effusion.  Lower extremity venous duplex left leg 4/26 Left: There is evidence of acute DVT in the External Iliac vein, Common Femoral vein, Peroneal veins, Soleal vein, and Gastrocnemius vein. There is no evidence of superficial venous thrombosis.  Antimicrobials:  Anti-infectives (From  admission, onward)   Start     Dose/Rate Route Frequency Ordered Stop   02/13/18 1000  vancomycin (VANCOCIN) IVPB 1000 mg/200 mL premix  Status:  Discontinued     1,000 mg 200 mL/hr over 60 Minutes Intravenous Every 24 hours 02/12/18 2051 02/17/18 1346   02/13/18 0000  ceFEPIme (MAXIPIME) 1 g in sodium chloride 0.9 % 100 mL IVPB     1 g 200 mL/hr over 30 Minutes Intravenous Every 8 hours 02/12/18 2041 02/20/18 1348   02/12/18 1700  piperacillin-tazobactam (ZOSYN) IVPB 3.375 g     3.375 g 100 mL/hr over 30 Minutes Intravenous  Once 02/12/18 1647 02/12/18 1745   02/12/18 1700  vancomycin (VANCOCIN) IVPB 1000 mg/200 mL premix     1,000 mg 200 mL/hr over 60 Minutes Intravenous  Once 02/12/18 1647 02/12/18 1906       Objective: Vitals:   02/20/18 1000 02/20/18 1152 02/20/18 1200 02/20/18 1400  BP: 121/75  102/81   Pulse: (!) 108 (!) 109 (!) 104 (!) 108  Resp: (!) 25 (!) 22 (!) 21 (!) 22  Temp:   97.6 F (36.4 C)   TempSrc:   Oral   SpO2: 94% 95% 99% 97%  Weight:      Height:  Intake/Output Summary (Last 24 hours) at 02/20/2018 1535 Last data filed at 02/20/2018 1500 Gross per 24 hour  Intake 760 ml  Output 1600 ml  Net -840 ml   Filed Weights   02/12/18 1621 02/14/18 0425  Weight: 55.8 kg (123 lb) 57.3 kg (126 lb 5.2 oz)    Examination: General exam: Appears comfortable  HEENT: PERRLA, oral mucosa moist, no sclera icterus or thrush Respiratory system: Decreased breath sounds in the left mid and lower lung field-pulse ox is 96% on 2 L Cardiovascular system: S1 & S2 heard, RRR.   Gastrointestinal system: Abdomen soft, non-tender, nondistended. Normal bowel sound. No organomegaly Central nervous system: Alert and oriented. No focal neurological deficits. Extremities: No cyanosis, clubbing or edema Skin: No rashes or ulcers Psychiatry:  Mood & affect appropriate.     Data Reviewed: I have personally reviewed following labs and imaging studies  CBC: Recent Labs    Lab 03-09-2018 0316 02/16/18 0336 02/17/18 0359 02/18/18 0357 02/19/18 0652  WBC 8.9 9.8 10.2 11.2* 10.1  NEUTROABS 8.2* 8.9* 9.1* 10.2 8.9*  HGB 9.2* 9.2* 9.7* 9.8* 9.8*  HCT 27.4* 26.9* 29.3* 29.2* 29.5*  MCV 91.6 90.6 91.6 91.8 92.2  PLT 284 340 383 411* 428*   Basic Metabolic Panel: Recent Labs  Lab 09-Mar-2018 0316 02/16/18 0336 02/17/18 0359 02/18/18 0357 02/19/18 0652  NA 137 137 136 137 138  K 4.6 4.2 4.0 4.1 4.1  CL 103 101 98* 99* 99*  CO2 26 24 27 26 29   GLUCOSE 134* 119* 85 115* 95  BUN 18 15 12 14 15   CREATININE 0.44 0.42* 0.37* 0.42* 0.38*  CALCIUM 9.3 9.1 9.0 8.8* 9.1  MG 2.1 1.9 1.8 1.8 1.9  PHOS 3.3 3.1 3.4 3.4 3.2   GFR: Estimated Creatinine Clearance: 58 mL/min (A) (by C-G formula based on SCr of 0.38 mg/dL (L)). Liver Function Tests: Recent Labs  Lab Mar 09, 2018 0316 02/16/18 0336 02/17/18 0359 02/18/18 0357 02/19/18 0652  AST 52* 36 32 29 26  ALT 78* 65* 54 52 47  ALKPHOS 91 91 90 81 79  BILITOT 0.5 0.4 0.3 0.3 0.4  PROT 6.6 6.1* 6.6 6.3* 6.5  ALBUMIN 2.1* 2.1* 2.2* 2.3* 2.3*   No results for input(s): LIPASE, AMYLASE in the last 168 hours. No results for input(s): AMMONIA in the last 168 hours. Coagulation Profile: No results for input(s): INR, PROTIME in the last 168 hours. Cardiac Enzymes: No results for input(s): CKTOTAL, CKMB, CKMBINDEX, TROPONINI in the last 168 hours. BNP (last 3 results) No results for input(s): PROBNP in the last 8760 hours. HbA1C: No results for input(s): HGBA1C in the last 72 hours. CBG: Recent Labs  Lab 02/19/18 1622 02/19/18 2112 02/20/18 0720 02/20/18 1221 02/20/18 1524  GLUCAP 168* 119* 89 216* 175*   Lipid Profile: No results for input(s): CHOL, HDL, LDLCALC, TRIG, CHOLHDL, LDLDIRECT in the last 72 hours. Thyroid Function Tests: No results for input(s): TSH, T4TOTAL, FREET4, T3FREE, THYROIDAB in the last 72 hours. Anemia Panel: No results for input(s): VITAMINB12, FOLATE, FERRITIN, TIBC, IRON,  RETICCTPCT in the last 72 hours. Urine analysis:    Component Value Date/Time   COLORURINE AMBER (A) 02/12/2018 2042   APPEARANCEUR CLOUDY (A) 02/12/2018 2042   LABSPEC 1.028 02/12/2018 2042   PHURINE 5.0 02/12/2018 2042   GLUCOSEU NEGATIVE 02/12/2018 2042   HGBUR NEGATIVE 02/12/2018 2042   BILIRUBINUR NEGATIVE 02/12/2018 2042   KETONESUR 5 (A) 02/12/2018 2042   PROTEINUR 100 (A) 02/12/2018 2042   NITRITE  NEGATIVE 02/12/2018 2042   LEUKOCYTESUR TRACE (A) 02/12/2018 2042   Sepsis Labs: @LABRCNTIP (procalcitonin:4,lacticidven:4) ) Recent Results (from the past 240 hour(s))  Blood Culture (routine x 2)     Status: None   Collection Time: 02/12/18  4:59 PM  Result Value Ref Range Status   Specimen Description   Final    BLOOD RIGHT ANTECUBITAL Performed at [facility] [address], [place], [place] [address]    Special Requests   Final    BOTTLES DRAWN AEROBIC AND ANAEROBIC Blood Culture results may not be optimal due to an excessive volume of blood received in culture bottles Performed at [facility] [address] [place], [place] [address]    Culture   Final    NO GROWTH 5 DAYS Performed at [facility], [place] [address] [place], [place] [address]    Report Status 02/17/2018 FINAL  Final  Blood Culture (routine x 2)     Status: None   Collection Time: 02/12/18  4:59 PM  Result Value Ref Range Status   Specimen Description   Final    BLOOD LEFT ANTECUBITAL Performed at [facility] [address], [place], [place] [address]    Special Requests   Final    BOTTLES DRAWN AEROBIC AND ANAEROBIC Blood Culture adequate volume Performed at [facility] [address] [place], [place] [address]    Culture   Final    NO GROWTH 5 DAYS Performed at [facility], [place] [address] [place],  [address]    Report Status 02/17/2018 FINAL  Final  MRSA PCR Screening     Status: Abnormal    Collection Time: 02/12/18 11:42 PM  Result Value Ref Range Status   MRSA by PCR POSITIVE (A) NEGATIVE Final    Comment:        The GeneXpert MRSA Assay (FDA approved for NASAL specimens only), is one component of a comprehensive MRSA colonization surveillance program. It is not intended to diagnose MRSA infection nor to guide or monitor treatment for MRSA infections. RESULT CALLED TO, READ BACK BY AND VERIFIED WITH: Coletta Memos [id number] 02/13/18 MKELLY Performed at [facility], [place] [address], [place],  [address]   Respiratory Panel by PCR     Status: None   Collection Time: 02/19/18  8:27 AM  Result Value Ref Range Status   Adenovirus NOT DETECTED NOT DETECTED Final   Coronavirus 229E NOT DETECTED NOT DETECTED Final   Coronavirus HKU1 NOT DETECTED NOT DETECTED Final   Coronavirus NL63 NOT DETECTED NOT DETECTED Final   Coronavirus OC43 NOT DETECTED NOT DETECTED Final   Metapneumovirus NOT DETECTED NOT DETECTED Final   Rhinovirus / Enterovirus NOT DETECTED NOT DETECTED Final   Influenza A NOT DETECTED NOT DETECTED Final   Influenza B NOT DETECTED NOT DETECTED Final   Parainfluenza Virus 1 NOT DETECTED NOT DETECTED Final   Parainfluenza Virus 2 NOT DETECTED NOT DETECTED Final   Parainfluenza Virus 3 NOT DETECTED NOT DETECTED Final   Parainfluenza Virus 4 NOT DETECTED NOT DETECTED Final   Respiratory Syncytial Virus NOT DETECTED NOT DETECTED Final   Bordetella pertussis NOT DETECTED NOT DETECTED Final   Chlamydophila pneumoniae NOT DETECTED NOT DETECTED Final   Mycoplasma pneumoniae NOT DETECTED NOT DETECTED Final         Radiology Studies: Dg Chest Port 1 View  Result Date: 02/19/2018 CLINICAL DATA:  Shortness of breath. History of lung malignancy, sepsis,  former smoker. EXAM: PORTABLE CHEST 1 VIEW COMPARISON:  Chest x-ray of February 18, 2018 FINDINGS: The right hemidiaphragm is higher than the left and the right lung is hypoinflated. There is  persistent right hilar density consistent with a known mass. The interstitial markings of the right lung are coarse. On the left diffusely increased lung markings are present. There is no confluent infiltrate. There is no pleural effusion. The heart and pulmonary vascularity are normal. The mediastinum is normal in width. IMPRESSION: Slight increase conspicuity of interstitial markings in the right lung accentuated by elevation of the hemidiaphragm. Persistent diffusely increased lung markings on the left. Overall there has not been dramatic interval change. Electronically Signed   By: David  Martinique M.D.   On: 02/19/2018 09:50      Scheduled Meds: . amLODipine  5 mg Oral Daily  . arformoterol  15 mcg Nebulization BID  . Chlorhexidine Gluconate Cloth  6 each Topical Q0600  . dexamethasone  4 mg Oral BID WC  . enoxaparin (LOVENOX) injection  60 mg Subcutaneous Q12H  . feeding supplement (ENSURE ENLIVE)  237 mL Oral BID BM  . guaiFENesin  1,200 mg Oral BID  . insulin aspart  0-5 Units Subcutaneous QHS  . insulin aspart  0-9 Units Subcutaneous TID WC  . ipratropium  0.5 mg Nebulization BID  . levalbuterol  0.63 mg Nebulization BID  . multivitamin with minerals  1 tablet Oral Daily  . mupirocin ointment  1 application Nasal BID  . pantoprazole  40 mg Oral Daily   Continuous Infusions:    LOS: 8 days    Time spent in minutes: 40 minutes    Debbe Odea, MD [facility] Pager: [email or web address] Password [email or web address] 02/20/2018, 3:35 PM

## 2018-02-20 NOTE — Progress Notes (Signed)
Patient stood with help to get to chair. O2 saturation dropped to 79-83% .Oxygen increased to 6 lper nasal cannula

## 2018-02-20 NOTE — Progress Notes (Addendum)
Nutrition Follow-up  DOCUMENTATION CODES:   Non-severe (moderate) malnutrition in context of chronic illness  INTERVENTION:  - Continue Ensure Enlive BID. - Will order daily multivitamin with minerals.  - Continue to encourage PO intakes.   NUTRITION DIAGNOSIS:   Moderate Malnutrition related to chronic illness, catabolic illness, cancer and cancer related treatments as evidenced by percent weight loss, moderate muscle depletion. -new diagnosis  GOAL:   Patient will meet greater than or equal to 90% of their needs -unmet  MONITOR:   PO intake, Supplement acceptance, Weight trends, Labs, Skin  ASSESSMENT:   67 y.o. 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  Patient has been having symptoms of upper respiratory infection over the last 4 days and states her son also had similar infection.  Patient had gone to her oncologist 2 days ago and was placed on antibiotics.  Today patient sister came to pick her up for appointment when patient was found to be weak and confused and was having chills.  Patient was brought to the ER.  No new weight since 4/26. Per chart review, pt consumed 30% of breakfast and lunch and 50% of dinner yesterday. Breakfast tray on bedside table and pt had consumed a cup of cranberry juice, a bowl of cereal, and a banana. She denies abdominal pain/pressure or nausea after meal. She recalls eating a grilled cheese some time yesterday. Pt minimally interacts and primarily concerned with when she is able to go home; pt sounds/seems depressed.   Able to complete NFPE this visit. Findings outlined below. Based on muscle wasting and pt losing 9 lbs (6.7% body weight) in 2 months, RD able to identify malnutrition. Nutrition diagnosis now updated to reflect this. Per review of order, pt has been  accepting Ensure ~50% of the time offered. RN provided pt with a chocolate Ensure per pt request following RD visit.   Medications reviewed; sliding scale Novolog. Labs reviewed; CBG: 89 mg/dL this AM, Cl: 99 mmol/L, creatinine: 0.38 mg/dL.       NUTRITION - FOCUSED PHYSICAL EXAM:    Most Recent Value  Orbital Region  No depletion  Upper Arm Region  No depletion  Thoracic and Lumbar Region  No depletion  Buccal Region  No depletion  Temple Region  No depletion  Clavicle Bone Region  No depletion  Clavicle and Acromion Bone Region  No depletion  Scapular Bone Region  Unable to assess  Dorsal Hand  No depletion  Patellar Region  Mild depletion  Anterior Thigh Region  Moderate depletion  Posterior Calf Region  Moderate depletion  Edema (RD Assessment)  None  Hair  Reviewed  Eyes  Reviewed  Mouth  Reviewed  Skin  Reviewed  Nails  Reviewed       Diet Order:   Diet Order           Diet Heart Room service appropriate? Yes; Fluid consistency: Thin  Diet effective now          EDUCATION NEEDS:   No education needs have been identified at this time  Skin:  Skin Assessment: Skin Integrity Issues: Skin Integrity Issues:: Stage II Stage II: R buttocks  Last BM:  5/1  Height:   Ht Readings from Last 1 Encounters:  02/12/18 5\' 3"  (1.6 m)    Weight:   Wt Readings from Last  1 Encounters:  02/14/18 126 lb 5.2 oz (57.3 kg)    Ideal Body Weight:  52.27 kg  BMI:  Body mass index is 22.38 kg/m.  Estimated Nutritional Needs:   Kcal:  1950-2120 (35-38 kcal/kg)  Protein:  85-95 grams (1.5-1.7 grams/kg)  Fluid:  >/= 2 L/day      Jarome Matin, MS, RD, LDN, [facility] Inpatient Clinical Dietitian Pager # [phone] After hours/weekend pager # [phone]

## 2018-02-20 NOTE — Progress Notes (Signed)
Patient appears very sad and states that she wants to go home. O2 at 2l with sats 98-99%.

## 2018-02-21 LAB — GLUCOSE, CAPILLARY
GLUCOSE-CAPILLARY: 78 mg/dL (ref 65–99)
GLUCOSE-CAPILLARY: 210 mg/dL — AB (ref 65–99)
GLUCOSE-CAPILLARY: 150 mg/dL — AB (ref 65–99)
Glucose-Capillary: 170 mg/dL — ABNORMAL HIGH (ref 65–99)

## 2018-02-21 LAB — PROCALCITONIN

## 2018-02-21 MED ORDER — SERTRALINE HCL 50 MG PO TABS
50.0000 mg | ORAL_TABLET | Freq: Every day | ORAL | Status: DC
Start: 2018-02-21 — End: 2018-02-26
  Administered 2018-02-21 – 2018-02-26 (×6): 50 mg via ORAL
  Filled 2018-02-21 (×6): qty 1

## 2018-02-21 NOTE — Evaluation (Signed)
Physical Therapy Evaluation Patient Details Name: Beth Alexander MRN: [id number] DOB: 1951/02/13 Today's Date: 02/21/2018     SATURATION QUALIFICATIONS: (This note is used to comply with regulatory documentation for home oxygen)  Patient Saturations on Room Air at Rest = 84%  Patient Saturations on Room Air while Ambulating = N/A  Patient Saturations on 3 Liters of oxygen while Ambulating = 79%      History of Present Illness  67 yo female admitted with sepsis, L LE DVT. Hx of met NSCLC.  Clinical Impression  On eval, pt required Min assist for mobility. She walked ~50 feet with a RW. Pt presents with general weakness, decreased activity tolerance, and impaired gait and balance. She is unsteady and at risk for falls when mobilizing. O2 sats dropped to 79% on 3L [place] O2 during ambulation. At least 3-4 minutes for O2 levels to recover to 90%. No family present during session. Per chart/MD, pt is refusing placement. Feel pt could benefit from SNF if she would agree.     Follow Up Recommendations SNF(pt is refusing placement per chart). If pt declines placement, then HHPT and 24 hour supervision/assist)    Equipment Recommendations  Rolling walker with 5" wheels    Recommendations for Other Services OT consult     Precautions / Restrictions Precautions Precautions: Fall Precaution Comments: monitor O2 sats Restrictions Weight Bearing Restrictions: No      Mobility  Bed Mobility Overal bed mobility: Needs Assistance Bed Mobility: Supine to Sit     Supine to sit: Supervision     General bed mobility comments: for safety  Transfers Overall transfer level: Needs assistance Equipment used: Rolling walker (2 wheeled) Transfers: Sit to/from Stand Sit to Stand: Min assist         General transfer comment: Assist to rise, stabilize, control descent. VCs safety, hand placement  Ambulation/Gait Ambulation/Gait assistance: Min assist Ambulation Distance (Feet): 50  Feet Assistive device: Rolling walker (2 wheeled) Gait Pattern/deviations: Step-through pattern;Decreased stride length;Drifts right/left;Staggering right;Staggering left     General Gait Details: Assist to stabilize pt throughout distance and to maneuver safely with walker. Pt fatigues easily. O2 sats dropped to 79% on 3L [place]  Stairs            Wheelchair Mobility    Modified Rankin (Stroke Patients Only)       Balance Overall balance assessment: Needs assistance         Standing balance support: Bilateral upper extremity supported Standing balance-Leahy Scale: Poor                               Pertinent Vitals/Pain Pain Assessment: No/denies pain    Home Living Family/patient expects to be discharged to:: Private residence Living Arrangements: Other relatives Available Help at Discharge: Family Type of Home: House Home Access: Stairs to enter Entrance Stairs-Rails: Right Entrance Stairs-Number of Steps: 3 Home Layout: One level Home Equipment: None      Prior Function Level of Independence: Independent               Hand Dominance        Extremity/Trunk Assessment   Upper Extremity Assessment Upper Extremity Assessment: Generalized weakness    Lower Extremity Assessment Lower Extremity Assessment: Generalized weakness    Cervical / Trunk Assessment Cervical / Trunk Assessment: Kyphotic  Communication   Communication: No difficulties  Cognition Arousal/Alertness: Awake/alert Behavior During Therapy: WFL for tasks assessed/performed Overall Cognitive Status: Difficult  to assess                                 General Comments: pt doesn't talk very much. A little difficult getting info from her      General Comments      Exercises     Assessment/Plan    PT Assessment Patient needs continued PT services  PT Problem List Decreased strength;Decreased balance;Decreased mobility;Decreased activity  tolerance;Decreased knowledge of use of DME;Cardiopulmonary status limiting activity       PT Treatment Interventions DME instruction;Gait training;Functional mobility training;Therapeutic activities;Balance training;Patient/family education;Therapeutic exercise    PT Goals (Current goals can be found in the Care Plan section)  Acute Rehab PT Goals Patient Stated Goal: home PT Goal Formulation: With patient Time For Goal Achievement: 03/07/18 Potential to Achieve Goals: Good    Frequency Min 3X/week   Barriers to discharge        Co-evaluation               AM-PAC PT "6 Clicks" Daily Activity  Outcome Measure Difficulty turning over in bed (including adjusting bedclothes, sheets and blankets)?: A Little Difficulty moving from lying on back to sitting on the side of the bed? : A Little Difficulty sitting down on and standing up from a chair with arms (e.g., wheelchair, bedside commode, etc,.)?: Unable Help needed moving to and from a bed to chair (including a wheelchair)?: A Little Help needed walking in hospital room?: A Little Help needed climbing 3-5 steps with a railing? : A Little 6 Click Score: 16    End of Session Equipment Utilized During Treatment: Gait belt;Oxygen Activity Tolerance: Patient limited by fatigue Patient left: in chair;with call bell/phone within reach;with chair alarm set   PT Visit Diagnosis: Muscle weakness (generalized) (M62.81);Difficulty in walking, not elsewhere classified (R26.2)    Time: 8372-9021 PT Time Calculation (min) (ACUTE ONLY): 16 min   Charges:   PT Evaluation $PT Eval Moderate Complexity: 1 Mod     PT G Codes:          Weston Anna, MPT Pager: [phone]

## 2018-02-21 NOTE — Progress Notes (Signed)
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   She was placed on antibiotics by her PCP which she took for about 2 days but presented to the ER she did not improve.  Lactic acid was 2.17- Temperature in the ED was 99 degrees.  Heart rate in the 1 teens and respiratory rate in the high 20s and 30s  Chest x-ray suggested left-sided infiltrates and therefore she was started on treatment for healthcare associated pneumonia causing acute respiratory failure. 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 States she feels fine. Only was able to walk 50 feet and pulse ox dropped to 79% I had a much longer conversation with the sister at the bedside and suggested that they speak with palliative care. They are in agreement.   Assessment & Plan:   Principal Problem:   Sepsis / Acute respiratory failure with hypoxia/left pulmonary infiltrates -Tachycardia and tachypnea have improved- lactic acidosis has resolved - -Lactic acid level on admission was 2.17 as well as procalcitonin was 2.78.  Lactic acid level is improved to 1.2 now Procalcitonin is trended down to 0.51 suggesting that they may have been a bacterial infection -Strep and Legionella antigens negative -Influenza negative -MRSA PCR + -Blood cultures x2- -Patient was on Zithromax as outpatient recently for an upper respiratory tract  infection -Treated for healthcare acquired pneumonia after being hospitalized but has been slow to improve with ongoing hypoxia with about 8 to 10 L of oxygen requirement and persistent left-sided interstitial infiltrate -diuretics have been unhelpful-BNP was only 55 -pulmonary critical care consulted on 4/30-respiratory virus panel ordered -She has been on Decadron taper for her metastasis and per pulmonary this should be sufficient to treat pneumonitis secondary to medication - Vanco and cefepime switched to cefepime only  - respiratory virus panel neg- pulm has stopped antibiotics - she has been weaned to 2 L O2 but becomes very hypoxic with ambulation- will consult palliative care  Active Problems:  Acute DVT left leg On 4/26 venous duplex of the lower extremity also showed an acute left leg DVT in  External Iliac vein, Common Femoral vein, Peroneal veins, Soleal vein, and Gastrocnemius vein.  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 -On chemotherapy status post first cycle on 4/20 -Status post radiation to brain metastasis and radiation to the right upper lobe which was completed on 3/29 -Quit smoking about 2 to 3 months ago - I have spoken with Dr Julien Nordmann- based on her current frail condition, he tells me he does not plan to continue chemo at this point-   Diabetes mellitus type 2 -A1c 6.7-continue sensitive  sliding scale insulin  Mildly elevated LFTs -Mildly elevated LFTs with an AST of 82 and ALT of 90-have normalized now and may have been related to sepsis -Noted to have fatty liver on right upper quadrant ultrasound -Acute hepatitis  panel  Constipation -MiraLAX  Anemia of chronic disease  Ref. Range 02/14/2018 08:07  Iron Latest Ref Range: 28 - 170 ug/dL 44  UIBC Latest Units: ug/dL 130  TIBC Latest Ref Range: 250 - 450 ug/dL 174 (L)  Saturation Ratios Latest Ref Range: 10.4 - 31.8 % 25  Ferritin Latest Ref Range: 11 - 307 ng/mL 1,237 (H)  Folate Latest Ref Range: >5.9 ng/mL 9.7  Vitamin B12 Latest Ref Range: 180 - 914 pg/mL 1,634 (H)   Hypertension -On amlodipine    DVT prophylaxis: Full dose Lovenox Code Status: DNR- will need to go home with gold DNR form Family Communication: sister [name] Disposition Plan: will consult palliative care Consultants:   Pulmonary critical care Procedures:   2D echo 4/27 Study Conclusions  - Left ventricle: The cavity size was normal. There was mild   concentric hypertrophy. Systolic function was normal. The   estimated ejection fraction was in the range of 55% to 60%. Wall   motion was normal; there were no regional wall motion   abnormalities. Doppler parameters are consistent with abnormal   left ventricular relaxation (grade 1 diastolic dysfunction).   There was no evidence of elevated ventricular filling pressure by   Doppler parameters. - Aortic root: The aortic root was normal in size. - Mitral valve: There was trivial regurgitation. - Right ventricle: Systolic function was normal. - Right atrium: The atrium was normal in size. - Tricuspid valve: There was mild regurgitation. - Pulmonic valve: There was trivial regurgitation. - Pulmonary arteries: Systolic pressure was mildly increased. PA   peak pressure: 39 mm Hg (S). - Inferior vena cava: The vessel was normal in size. - Pericardium, extracardiac: There was no pericardial effusion.  Lower extremity venous duplex left leg 4/26 Left: There is evidence of acute DVT in the External Iliac vein, Common Femoral vein, Peroneal veins, Soleal vein, and Gastrocnemius vein. There is no evidence of  superficial venous thrombosis.  Antimicrobials:  Anti-infectives (From admission, onward)   Start     Dose/Rate Route Frequency Ordered Stop   02/13/18 1000  vancomycin (VANCOCIN) IVPB 1000 mg/200 mL premix  Status:  Discontinued     1,000 mg 200 mL/hr over 60 Minutes Intravenous Every 24 hours 02/12/18 2051 02/17/18 1346   02/13/18 0000  ceFEPIme (MAXIPIME) 1 g in sodium chloride 0.9 % 100 mL IVPB     1 g 200 mL/hr over 30 Minutes Intravenous Every 8 hours 02/12/18 2041 02/20/18 1348   02/12/18 1700  piperacillin-tazobactam (ZOSYN) IVPB 3.375 g     3.375 g 100 mL/hr over 30 Minutes Intravenous  Once 02/12/18 1647 02/12/18 1745   02/12/18 1700  vancomycin (VANCOCIN) IVPB 1000 mg/200 mL premix     1,000 mg 200 mL/hr over 60 Minutes Intravenous  Once 02/12/18 1647 02/12/18 1906       Objective: Vitals:   02/21/18 1105 02/21/18 1113 02/21/18 1429 02/21/18 1741  BP:   96/71   Pulse:   (!) 106   Resp:   18   Temp:    (!) 97.4 F (36.3 C)  TempSrc:    Oral  SpO2: (!) 89% (!) 89% 95%   Weight:      Height:        Intake/Output  Summary (Last 24 hours) at 02/21/2018 1856 Last data filed at 02/21/2018 1741 Gross per 24 hour  Intake 360 ml  Output 1500 ml  Net -1140 ml   Filed Weights   02/12/18 1621 02/14/18 0425  Weight: 55.8 kg (123 lb) 57.3 kg (126 lb 5.2 oz)    Examination: General exam: Appears comfortable  HEENT: PERRLA, oral mucosa moist, no sclera icterus or thrush Respiratory system: Decreased breath sounds in the left mid and lower lung field-pulse ox is 96% on 2 L Cardiovascular system: S1 & S2 heard, RRR.   Gastrointestinal system: Abdomen soft, non-tender, nondistended. Normal bowel sound. No organomegaly Central nervous system: Alert and oriented. No focal neurological deficits. Extremities: No cyanosis, clubbing or edema Skin: No rashes or ulcers Psychiatry:  Mood & affect appropriate.     Data Reviewed: I have personally reviewed following labs and  imaging studies  CBC: Recent Labs  Lab 02/15/18 0316 02/16/18 0336 02/17/18 0359 02/18/18 0357 02/19/18 0652  WBC 8.9 9.8 10.2 11.2* 10.1  NEUTROABS 8.2* 8.9* 9.1* 10.2 8.9*  HGB 9.2* 9.2* 9.7* 9.8* 9.8*  HCT 27.4* 26.9* 29.3* 29.2* 29.5*  MCV 91.6 90.6 91.6 91.8 92.2  PLT 284 340 383 411* 419*   Basic Metabolic Panel: Recent Labs  Lab 02/15/18 0316 02/16/18 0336 02/17/18 0359 02/18/18 0357 02/19/18 0652  NA 137 137 136 137 138  K 4.6 4.2 4.0 4.1 4.1  CL 103 101 98* 99* 99*  CO2 26 24 27 26 29   GLUCOSE 134* 119* 85 115* 95  BUN 18 15 12 14 15   CREATININE 0.44 0.42* 0.37* 0.42* 0.38*  CALCIUM 9.3 9.1 9.0 8.8* 9.1  MG 2.1 1.9 1.8 1.8 1.9  PHOS 3.3 3.1 3.4 3.4 3.2   GFR: Estimated Creatinine Clearance: 58 mL/min (A) (by C-G formula based on SCr of 0.38 mg/dL (L)). Liver Function Tests: Recent Labs  Lab 02/15/18 0316 02/16/18 0336 02/17/18 0359 02/18/18 0357 02/19/18 0652  AST 52* 36 32 29 26  ALT 78* 65* 54 52 47  ALKPHOS 91 91 90 81 79  BILITOT 0.5 0.4 0.3 0.3 0.4  PROT 6.6 6.1* 6.6 6.3* 6.5  ALBUMIN 2.1* 2.1* 2.2* 2.3* 2.3*   No results for input(s): LIPASE, AMYLASE in the last 168 hours. No results for input(s): AMMONIA in the last 168 hours. Coagulation Profile: No results for input(s): INR, PROTIME in the last 168 hours. Cardiac Enzymes: No results for input(s): CKTOTAL, CKMB, CKMBINDEX, TROPONINI in the last 168 hours. BNP (last 3 results) No results for input(s): PROBNP in the last 8760 hours. HbA1C: No results for input(s): HGBA1C in the last 72 hours. CBG: Recent Labs  Lab 02/20/18 1524 02/20/18 2108 02/21/18 0739 02/21/18 1140 02/21/18 1644  GLUCAP 175* 203* 78 170* 210*   Lipid Profile: No results for input(s): CHOL, HDL, LDLCALC, TRIG, CHOLHDL, LDLDIRECT in the last 72 hours. Thyroid Function Tests: No results for input(s): TSH, T4TOTAL, FREET4, T3FREE, THYROIDAB in the last 72 hours. Anemia Panel: No results for input(s):  VITAMINB12, FOLATE, FERRITIN, TIBC, IRON, RETICCTPCT in the last 72 hours. Urine analysis:    Component Value Date/Time   COLORURINE AMBER (A) 02/12/2018 2042   APPEARANCEUR CLOUDY (A) 02/12/2018 2042   LABSPEC 1.028 02/12/2018 2042   PHURINE 5.0 02/12/2018 2042   GLUCOSEU NEGATIVE 02/12/2018 2042   HGBUR NEGATIVE 02/12/2018 2042   BILIRUBINUR NEGATIVE 02/12/2018 2042   KETONESUR 5 (A) 02/12/2018 2042   PROTEINUR 100 (A) 02/12/2018 2042   NITRITE NEGATIVE 02/12/2018  2042   LEUKOCYTESUR TRACE (A) 02/12/2018 2042   Sepsis Labs: @LABRCNTIP (procalcitonin:4,lacticidven:4) ) Recent Results (from the past 240 hour(s))  Blood Culture (routine x 2)     Status: None   Collection Time: 02/12/18  4:59 PM  Result Value Ref Range Status   Specimen Description   Final    BLOOD RIGHT ANTECUBITAL Performed at [facility] [address], [place], [place] [address]    Special Requests   Final    BOTTLES DRAWN AEROBIC AND ANAEROBIC Blood Culture results may not be optimal due to an excessive volume of blood received in culture bottles Performed at [facility] [address] [place], [place] [address]    Culture   Final    NO GROWTH 5 DAYS Performed at [facility], [place] [address] [place], [place] [address]    Report Status 02/17/2018 FINAL  Final  Blood Culture (routine x 2)     Status: None   Collection Time: 02/12/18  4:59 PM  Result Value Ref Range Status   Specimen Description   Final    BLOOD LEFT ANTECUBITAL Performed at [facility] [address], [place], [place] [address]    Special Requests   Final    BOTTLES DRAWN AEROBIC AND ANAEROBIC Blood Culture adequate volume Performed at [facility] [address], [place], [place] [address]    Culture   Final    NO GROWTH 5 DAYS Performed at [facility], [place] [address], [place], [place] [address]    Report Status 02/17/2018 FINAL  Final   MRSA PCR Screening     Status: Abnormal   Collection Time: 02/12/18 11:42 PM  Result Value Ref Range Status   MRSA by PCR POSITIVE (A) NEGATIVE Final    Comment:        The GeneXpert MRSA Assay (FDA approved for NASAL specimens only), is one component of a comprehensive MRSA colonization surveillance program. It is not intended to diagnose MRSA infection nor to guide or monitor treatment for MRSA infections. RESULT CALLED TO, READ BACK BY AND VERIFIED WITH: Coletta Memos 0630 02/13/18 MKELLY Performed at [facility], [place] [address] [place], [place] [address]   Respiratory Panel by PCR     Status: None   Collection Time: 02/19/18  8:27 AM  Result Value Ref Range Status   Adenovirus NOT DETECTED NOT DETECTED Final   Coronavirus 229E NOT DETECTED NOT DETECTED Final   Coronavirus HKU1 NOT DETECTED NOT DETECTED Final   Coronavirus NL63 NOT DETECTED NOT DETECTED Final   Coronavirus OC43 NOT DETECTED NOT DETECTED Final   Metapneumovirus NOT DETECTED NOT DETECTED Final   Rhinovirus / Enterovirus NOT DETECTED NOT DETECTED Final   Influenza A NOT DETECTED NOT DETECTED Final   Influenza B NOT DETECTED NOT DETECTED Final   Parainfluenza Virus 1 NOT DETECTED NOT DETECTED Final   Parainfluenza Virus 2 NOT DETECTED NOT DETECTED Final   Parainfluenza Virus 3 NOT DETECTED NOT DETECTED Final   Parainfluenza Virus 4 NOT DETECTED NOT DETECTED Final   Respiratory Syncytial Virus NOT DETECTED NOT DETECTED Final   Bordetella pertussis NOT DETECTED NOT DETECTED Final   Chlamydophila pneumoniae NOT DETECTED NOT DETECTED Final   Mycoplasma pneumoniae NOT DETECTED NOT DETECTED Final         Radiology Studies: No results found.    Scheduled Meds: . amLODipine  5 mg Oral Daily  . arformoterol  15 mcg Nebulization  BID  . Chlorhexidine Gluconate Cloth  6 each Topical Q0600  . dexamethasone  4 mg Oral BID WC  . enoxaparin (LOVENOX) injection  60 mg Subcutaneous Q12H  .  feeding supplement (ENSURE ENLIVE)  237 mL Oral BID BM  . guaiFENesin  1,200 mg Oral BID  . insulin aspart  0-5 Units Subcutaneous QHS  . insulin aspart  0-9 Units Subcutaneous TID WC  . ipratropium  0.5 mg Nebulization BID  . levalbuterol  0.63 mg Nebulization BID  . multivitamin with minerals  1 tablet Oral Daily  . mupirocin ointment  1 application Nasal BID  . pantoprazole  40 mg Oral Daily  . sertraline  50 mg Oral Daily   Continuous Infusions:    LOS: 9 days    Time spent in minutes: 35 minutes-      Debbe Odea, MD [facility] Pager: [email or web address] Password [facility] 02/21/2018, 6:56 PM

## 2018-02-21 NOTE — Progress Notes (Signed)
Spoke to patient in rm who defers to her sister Beth Alexander-Left vm w/Beth Alexander c#[phone]-informed her that PT recc SNF, but that the palliative care team will talk to her & patient about [name], & options. Will await Palliative recc.

## 2018-02-22 DIAGNOSIS — Z7189 Other specified counseling: Secondary | ICD-10-CM

## 2018-02-22 DIAGNOSIS — R0602 Shortness of breath: Secondary | ICD-10-CM

## 2018-02-22 DIAGNOSIS — R945 Abnormal results of liver function studies: Secondary | ICD-10-CM

## 2018-02-22 DIAGNOSIS — J181 Lobar pneumonia, unspecified organism: Secondary | ICD-10-CM

## 2018-02-22 DIAGNOSIS — Z515 Encounter for palliative care: Secondary | ICD-10-CM

## 2018-02-22 DIAGNOSIS — E440 Moderate protein-calorie malnutrition: Secondary | ICD-10-CM

## 2018-02-22 LAB — GLUCOSE, CAPILLARY
GLUCOSE-CAPILLARY: 89 mg/dL (ref 65–99)
GLUCOSE-CAPILLARY: 221 mg/dL — AB (ref 65–99)
GLUCOSE-CAPILLARY: 108 mg/dL — AB (ref 65–99)
Glucose-Capillary: 94 mg/dL (ref 65–99)

## 2018-02-22 LAB — CBC
HCT: 29.3 % — ABNORMAL LOW (ref 36.0–46.0)
HEMOGLOBIN: 9.8 g/dL — AB (ref 12.0–15.0)
MCH: 30.9 pg (ref 26.0–34.0)
MCHC: 33.4 g/dL (ref 30.0–36.0)
MCV: 92.4 fL (ref 78.0–100.0)
PLATELETS: 500 10*3/uL — AB (ref 150–400)
RBC: 3.17 MIL/uL — AB (ref 3.87–5.11)
RDW: 16 % — ABNORMAL HIGH (ref 11.5–15.5)
WBC: 10.4 10*3/uL (ref 4.0–10.5)

## 2018-02-22 NOTE — Consult Note (Signed)
Consultation Note Date: 02/22/2018   Patient Name: Beth Alexander  DOB: [birth year]  MRN: [id number]  Age / Sex: 67 y.o., female  PCP: [name], MD Referring Physician: Debbe Odea, MD  Reason for Consultation: Establishing goals of care  HPI/Patient Profile: 67 y.o. female  admitted on 02/12/2018    Clinical Assessment and Goals of Care:  patient is a 67 year old lady with past medical history significant for stage IV squamous lung cancer status post chemotherapy and radiation. Patient has been admitted to hospital medicine service with sepsis acute hypoxic respiratory failure noted to have left pulmonary infiltrates, hospital course also complicated by acute DVT left lower extremity, severe deconditioning, brief acute toxic encephalopathy in earlier part of hospitalization. Also has underlying diabetes. Patient has been placed on steroids, antibiotics, appropriate anticoagulation.She has been seen by physical therapy and has been recommended for skilled nursing facility for rehabilitation. A palliative consult has been requested for additional goals of care discussions.  Patient is resting in bed. She is attempting to take her morning medications. Discussed with the patient. She states that her sister [name] is her medical power of attorney and she will make any kind of decisions after discussing with her sister. I introduced myself and palliative care as follows: Palliative medicine is specialized medical care for people living with serious illness. It focuses on providing relief from the symptoms and stress of a serious illness. The goal is to improve quality of life for both the patient and the family.  Call placed and discussed with sister [name]. Reviewed the patient's hospital course. Sister is appreciative of all the information she has received from medical staff. I discussed with her about skilled  nursing facility recommendations, other recommendations from physical therapy.  Goals are not comfort only. Sister [name] does not want the patient to be told repeatedly that she is about to die. I discussed with her that that was not the nature of my visit. We discussed about continuing current mode of care, trying for rehabilitation - physical therapy attempts. However, discussed frankly with sister that the patient has serious, irreversible illnesses and has a high risk for ongoing decline and decompensation, the sister understands. She will follow-up with the patient later today.  HCPOA  sister [name] [phone]  [place]   SNF rehab with palliative care on discharge Continue current mode of care, goals of care are not comfort-only.   Code Status/Advance Care Planning:  DNR    Symptom Management:    as above   Palliative Prophylaxis:   Bowel Regimen   Psycho-social/Spiritual:   Desire for further Chaplaincy support:yes  Additional Recommendations: Education on Hospice  Prognosis:   < 12 months  Discharge Planning: [facility] for rehab with Palliative care service follow-up      Primary Diagnoses: Present on Admission: . Sepsis ([place]) . Hypertension . Squamous cell carcinoma of right lung ([place]) . HCAP (healthcare-associated pneumonia) . Protein calorie malnutrition ([place])   I have reviewed the medical record, interviewed the patient  and family, and examined the patient. The following aspects are pertinent.  Past Medical History:  Diagnosis Date  . Brain metastasis ([place])   . Hypertension   . lung ca dx'd 11/2017   xrt comp; chemo ongoing  . 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 Tobacco use    Social History   Socioeconomic History  . Marital status: Divorced    Spouse name: Not on file  . Number of children: Not on file  . Years of education: Not on file  . Highest education level: Not on file  Occupational History    . Not on file  Social Needs  . Financial resource strain: Not on file  . Food insecurity:    Worry: Not on file    Inability: Not on file  . Transportation needs:    Medical: Not on file    Non-medical: Not on file  Tobacco Use  . Smoking status: Former Smoker    Packs/day: 0.50    Years: 15.00    Pack years: 7.50    Last attempt to quit: 12/10/2017    Years since quitting: 0.2  . Smokeless tobacco: Never Used  . Tobacco comment: quit smoking 12/10/17--12/18/17  Substance and Sexual Activity  . Alcohol use: No  . Drug use: No  . Sexual activity: Not on file  Lifestyle  . Physical activity:    Days per week: Not on file    Minutes per session: Not on file  . Stress: Not on file  Relationships  . Social connections:    Talks on phone: Not on file    Gets together: Not on file    Attends religious service: Not on file    Active member of club or organization: Not on file    Attends meetings of clubs or organizations: Not on file    Relationship status: Not on file  Other Topics Concern  . Not on file  Social History Narrative  . Not on file   Family History  Problem Relation Age of Onset  . Breast cancer Mother   . Heart disease Father   . Lung cancer Brother   . Colon cancer Brother    Scheduled Meds: . amLODipine  5 mg Oral Daily  . arformoterol  15 mcg Nebulization BID  . Chlorhexidine Gluconate Cloth  6 each Topical Q0600  . dexamethasone  4 mg Oral BID WC  . enoxaparin (LOVENOX) injection  60 mg Subcutaneous Q12H  . feeding supplement (ENSURE ENLIVE)  237 mL Oral BID BM  . guaiFENesin  1,200 mg Oral BID  . insulin aspart  0-5 Units Subcutaneous QHS  . insulin aspart  0-9 Units Subcutaneous TID WC  . ipratropium  0.5 mg Nebulization BID  . levalbuterol  0.63 mg Nebulization BID  . multivitamin with minerals  1 tablet Oral Daily  . mupirocin ointment  1 application Nasal BID  . pantoprazole  40 mg Oral Daily  . sertraline  50 mg Oral Daily   Continuous  Infusions: PRN Meds:.acetaminophen **OR** acetaminophen, guaiFENesin-dextromethorphan, ondansetron **OR** ondansetron (ZOFRAN) IV, oxyCODONE, sodium chloride, white petrolatum Medications Prior to Admission:  Prior to Admission medications   Medication Sig Start Date End Date Taking? Authorizing Provider  acetaminophen (TYLENOL) 500 MG tablet Take 500 mg by mouth every 6 (six) hours as needed for mild pain or headache.   Yes [provider]  amLODipine (NORVASC) 5 MG tablet Take 1 tablet (5 mg total) by mouth daily. 06/30/14  Yes Tysinger,  Camelia Eng, PA-C  azithromycin (ZITHROMAX) 250 MG tablet 2 tabs day 1 then 1 tab daily for 4 days 02/10/18  Yes Tanner, Lyndon Code., PA-C  chlorpheniramine-HYDROcodone (TUSSIONEX PENNKINETIC ER) 10-8 MG/5ML SUER Take 5 mLs by mouth every 12 (twelve) hours as needed for cough. 02/10/18  Yes Tanner, Lyndon Code., PA-C  dexamethasone (DECADRON) 4 MG tablet Take 1 tablet (4 mg total) by mouth 2 (two) times daily with a meal. 01/17/18  Yes Kyung Rudd, MD  fluconazole (DIFLUCAN) 100 MG tablet Take 1 tablet (100 mg total) by mouth daily. 01/29/18  Yes Curcio, Roselie Awkward, NP  LORazepam (ATIVAN) 0.5 MG tablet 1 tablet po 30 minutes prior to radiation or MRI, or every 4-6 hours prn anxiety 12/31/17  Yes Hayden Pedro, PA-C  oxyCODONE (OXY IR/ROXICODONE) 5 MG immediate release tablet Take 1 tablet (5 mg total) by mouth every 4 (four) hours as needed for severe pain. 01/07/18  Yes Hayden Pedro, PA-C  sertraline (ZOLOFT) 50 MG tablet Take 1/2 tab daily for 1 week and than full tab daily 01/30/18  Yes Arfeen, Arlyce Harman, MD  benzonatate (TESSALON) 100 MG capsule Take 1 capsule (100 mg total) by mouth 3 (three) times daily as needed for cough. Patient not taking: Reported on 02/12/2018 02/10/18   Harle Stanford., PA-C  nicotine (NICODERM CQ - DOSED IN MG/24 HOURS) 14 mg/24hr patch Place 1 patch (14 mg total) onto the skin daily. Patient not taking: Reported on 02/10/2018 12/13/17    Domenic Polite, MD  prochlorperazine (COMPAZINE) 10 MG tablet Take 1 tablet (10 mg total) by mouth every 6 (six) hours as needed for nausea or vomiting. Patient not taking: Reported on 02/12/2018 01/15/18   Maryanna Shape, NP   No Known Allergies Review of Systems +weakness  Physical Exam Awake alert Doesn't verbalize much S1 S2 Decreased breath sounds Abdomen soft not tender  no edema NAD Generalized weakness  Vital Signs: BP (!) 148/86 (BP Location: Right Arm)   Pulse (!) 101   Temp (!) 97.3 F (36.3 C) (Oral)   Resp 18   Ht 5\' 3"  (1.6 m)   Wt 57.3 kg (126 lb 5.2 oz)   SpO2 90%   BMI 22.38 kg/m  Pain Scale: 0-10 POSS *See Group Information*: 1-Acceptable,Awake and alert Pain Score: 0-No pain   SpO2: SpO2: 90 % O2 Device:SpO2: 90 % O2 Flow Rate: .O2 Flow Rate (L/min): 4 L/min  IO: Intake/output summary:   Intake/Output Summary (Last 24 hours) at 02/22/2018 1221 Last data filed at 02/21/2018 1741 Gross per 24 hour  Intake 120 ml  Output 1050 ml  Net -930 ml    LBM: Last BM Date: 02/21/18 Baseline Weight: Weight: 55.8 kg (123 lb) Most recent weight: Weight: 57.3 kg (126 lb 5.2 oz)     Palliative Assessment/Data:   PPS 40%  Time In:  10 Time Out:  11 Time Total:  60 min  Greater than 50%  of this time was spent counseling and coordinating care related to the above assessment and plan.  Signed by: Loistine Chance, MD  [phone]  Please contact Palliative Medicine Team phone at [phone] for questions and concerns.  For individual provider: See Shea Evans

## 2018-02-22 NOTE — Progress Notes (Signed)
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   She was placed on antibiotics by her PCP which she took for about 2 days but presented to the ER she did not improve.  Lactic acid was 2.17- Temperature in the ED was 99 degrees.  Heart rate in the 1 teens and respiratory rate in the high 20s and 30s  Chest x-ray suggested left-sided infiltrates and therefore she was started on treatment for healthcare associated pneumonia causing acute respiratory failure. 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 States she feels fine. Only was able to walk 50 feet and pulse ox dropped to 79% I had a much longer conversation with the sister at the bedside and suggested that they speak with palliative care. They are in agreement.   Assessment & Plan:   Principal Problem:   Sepsis / Acute respiratory failure with hypoxia/left pulmonary infiltrates -Tachycardia and tachypnea have improved- lactic acidosis has resolved - -Lactic acid level on admission was 2.17 as well as procalcitonin was 2.78.  Lactic acid level is improved to 1.2 now Procalcitonin is trended down to 0.51 suggesting that they may have been a bacterial infection -Strep and Legionella antigens negative -Influenza negative -MRSA PCR + -Blood cultures x2- -Patient was on Zithromax as outpatient recently for an upper respiratory tract  infection -Treated for healthcare acquired pneumonia after being hospitalized but has been slow to improve with ongoing hypoxia with about 8 to 10 L of oxygen requirement and persistent left-sided interstitial infiltrate -diuretics have been unhelpful-BNP was only 55 -pulmonary critical care consulted on 4/30-respiratory virus panel ordered -She has been on Decadron taper for her metastasis and per pulmonary this should be sufficient to treat pneumonitis secondary to medication - Vanco and cefepime switched to cefepime only  - respiratory virus panel neg- pulm has stopped antibiotics - she has been weaned to 2 L O2 but becomes very hypoxic with ambulation-  - palliative care consulted- she initially declined SNF but has left the decision making to her sister who has told palliative care that she will go to SNF with palliative care  Active Problems:  Acute DVT left leg On 4/26 venous duplex of the lower extremity also showed an acute left leg DVT in  External Iliac vein, Common Femoral vein, Peroneal veins, Soleal vein, and Gastrocnemius vein.  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 -On chemotherapy status post first cycle on 4/20 -Status post radiation to brain metastasis and radiation to the right upper lobe which was completed on 3/29 -Quit  smoking about 2 to 3 months ago - I have spoken with Dr Julien Nordmann- based on her current frail condition, he tells me he does not plan to continue chemo at this point-   Diabetes mellitus type 2 -A1c 6.7-continue sensitive sliding scale insulin  Mildly elevated LFTs- fatty  liver -Mildly elevated LFTs with an AST of 82 and ALT of 90-have normalized now and may have been related to sepsis -Noted to have fatty liver on right upper quadrant ultrasound -Acute hepatitis panel  Constipation -MiraLAX  Anemia of chronic disease  Ref. Range 02/14/2018 08:07  Iron Latest Ref Range: 28 - 170 ug/dL 44  UIBC Latest Units: ug/dL 130  TIBC Latest Ref Range: 250 - 450 ug/dL 174 (L)  Saturation Ratios Latest Ref Range: 10.4 - 31.8 % 25  Ferritin Latest Ref Range: 11 - 307 ng/mL 1,237 (H)  Folate Latest Ref Range: >5.9 ng/mL 9.7  Vitamin B12 Latest Ref Range: 180 - 914 pg/mL 1,634 (H)   Hypertension -On amlodipine    DVT prophylaxis: Full dose Lovenox Code Status: DNR- will need to go home with gold DNR form Family Communication: sister [name] Disposition Plan: SNF tomorrow if bed found  Consultants:   Pulmonary critical care Procedures:   2D echo 4/27 Study Conclusions  - Left ventricle: The cavity size was normal. There was mild   concentric hypertrophy. Systolic function was normal. The   estimated ejection fraction was in the range of 55% to 60%. Wall   motion was normal; there were no regional wall motion   abnormalities. Doppler parameters are consistent with abnormal   left ventricular relaxation (grade 1 diastolic dysfunction).   There was no evidence of elevated ventricular filling pressure by   Doppler parameters. - Aortic root: The aortic root was normal in size. - Mitral valve: There was trivial regurgitation. - Right ventricle: Systolic function was normal. - Right atrium: The atrium was normal in size. - Tricuspid valve: There was mild regurgitation. - Pulmonic valve: There was trivial regurgitation. - Pulmonary arteries: Systolic pressure was mildly increased. PA   peak pressure: 39 mm Hg (S). - Inferior vena cava: The vessel was normal in size. - Pericardium, extracardiac: There was no pericardial effusion.  Lower extremity  venous duplex left leg 4/26 Left: There is evidence of acute DVT in the External Iliac vein, Common Femoral vein, Peroneal veins, Soleal vein, and Gastrocnemius vein. There is no evidence of superficial venous thrombosis.  Antimicrobials:  Anti-infectives (From admission, onward)   Start     Dose/Rate Route Frequency Ordered Stop   02/13/18 1000  vancomycin (VANCOCIN) IVPB 1000 mg/200 mL premix  Status:  Discontinued     1,000 mg 200 mL/hr over 60 Minutes Intravenous Every 24 hours 02/12/18 2051 02/17/18 1346   02/13/18 0000  ceFEPIme (MAXIPIME) 1 g in sodium chloride 0.9 % 100 mL IVPB     1 g 200 mL/hr over 30 Minutes Intravenous Every 8 hours 02/12/18 2041 02/20/18 1348   02/12/18 1700  piperacillin-tazobactam (ZOSYN) IVPB 3.375 g     3.375 g 100 mL/hr over 30 Minutes Intravenous  Once 02/12/18 1647 02/12/18 1745   02/12/18 1700  vancomycin (VANCOCIN) IVPB 1000 mg/200 mL premix     1,000 mg 200 mL/hr over 60 Minutes Intravenous  Once 02/12/18 1647 02/12/18 1906       Objective: Vitals:   02/21/18 1741 02/21/18 2003 02/21/18 2148 02/22/18 0457  BP:  111/77  (!) 148/86  Pulse:  (!) 112  Marland Kitchen)  101  Resp:  18  18  Temp: (!) 97.4 F (36.3 C)   (!) 97.3 F (36.3 C)  TempSrc: Oral   Oral  SpO2:  91% 92% 90%  Weight:      Height:        Intake/Output Summary (Last 24 hours) at 02/22/2018 1339 Last data filed at 02/21/2018 1741 Gross per 24 hour  Intake 120 ml  Output 1050 ml  Net -930 ml   Filed Weights   02/12/18 1621 02/14/18 0425  Weight: 55.8 kg (123 lb) 57.3 kg (126 lb 5.2 oz)    Examination: General exam: Appears comfortable  HEENT: PERRLA, oral mucosa moist, no sclera icterus or thrush Respiratory system: Decreased breath sounds in the left mid and lower lung field-pulse ox is 96% on 2 L Cardiovascular system: S1 & S2 heard, RRR.   Gastrointestinal system: Abdomen soft, non-tender, nondistended. Normal bowel sound. No organomegaly Central nervous system: Alert and  oriented. No focal neurological deficits. Extremities: No cyanosis, clubbing or edema Skin: No rashes or ulcers Psychiatry:  Mood & affect appropriate.   Data Reviewed: I have personally reviewed following labs and imaging studies  CBC: Recent Labs  Lab 02/16/18 0336 02/17/18 0359 02/18/18 0357 02/19/18 0652 02/22/18 0511  WBC 9.8 10.2 11.2* 10.1 10.4  NEUTROABS 8.9* 9.1* 10.2 8.9*  --   HGB 9.2* 9.7* 9.8* 9.8* 9.8*  HCT 26.9* 29.3* 29.2* 29.5* 29.3*  MCV 90.6 91.6 91.8 92.2 92.4  PLT 340 383 411* 440* 485*   Basic Metabolic Panel: Recent Labs  Lab 02/16/18 0336 02/17/18 0359 02/18/18 0357 02/19/18 0652  NA 137 136 137 138  K 4.2 4.0 4.1 4.1  CL 101 98* 99* 99*  CO2 24 27 26 29   GLUCOSE 119* 85 115* 95  BUN 15 12 14 15   CREATININE 0.42* 0.37* 0.42* 0.38*  CALCIUM 9.1 9.0 8.8* 9.1  MG 1.9 1.8 1.8 1.9  PHOS 3.1 3.4 3.4 3.2   GFR: Estimated Creatinine Clearance: 58 mL/min (A) (by C-G formula based on SCr of 0.38 mg/dL (L)). Liver Function Tests: Recent Labs  Lab 02/16/18 0336 02/17/18 0359 02/18/18 0357 02/19/18 0652  AST 36 32 29 26  ALT 65* 54 52 47  ALKPHOS 91 90 81 79  BILITOT 0.4 0.3 0.3 0.4  PROT 6.1* 6.6 6.3* 6.5  ALBUMIN 2.1* 2.2* 2.3* 2.3*   No results for input(s): LIPASE, AMYLASE in the last 168 hours. No results for input(s): AMMONIA in the last 168 hours. Coagulation Profile: No results for input(s): INR, PROTIME in the last 168 hours. Cardiac Enzymes: No results for input(s): CKTOTAL, CKMB, CKMBINDEX, TROPONINI in the last 168 hours. BNP (last 3 results) No results for input(s): PROBNP in the last 8760 hours. HbA1C: No results for input(s): HGBA1C in the last 72 hours. CBG: Recent Labs  Lab 02/21/18 1140 02/21/18 1644 02/21/18 2002 02/22/18 0747 02/22/18 1224  GLUCAP 170* 210* 150* 94 89   Lipid Profile: No results for input(s): CHOL, HDL, LDLCALC, TRIG, CHOLHDL, LDLDIRECT in the last 72 hours. Thyroid Function Tests: No  results for input(s): TSH, T4TOTAL, FREET4, T3FREE, THYROIDAB in the last 72 hours. Anemia Panel: No results for input(s): VITAMINB12, FOLATE, FERRITIN, TIBC, IRON, RETICCTPCT in the last 72 hours. Urine analysis:    Component Value Date/Time   COLORURINE AMBER (A) 02/12/2018 2042   APPEARANCEUR CLOUDY (A) 02/12/2018 2042   LABSPEC 1.028 02/12/2018 2042   PHURINE 5.0 02/12/2018 2042   GLUCOSEU NEGATIVE 02/12/2018 2042  HGBUR NEGATIVE 02/12/2018 2042   BILIRUBINUR NEGATIVE 02/12/2018 2042   KETONESUR 5 (A) 02/12/2018 2042   PROTEINUR 100 (A) 02/12/2018 2042   NITRITE NEGATIVE 02/12/2018 2042   LEUKOCYTESUR TRACE (A) 02/12/2018 2042   Sepsis Labs: @LABRCNTIP (procalcitonin:4,lacticidven:4) ) Recent Results (from the past 240 hour(s))  Blood Culture (routine x 2)     Status: None   Collection Time: 02/12/18  4:59 PM  Result Value Ref Range Status   Specimen Description   Final    BLOOD RIGHT ANTECUBITAL Performed at [facility], [place] [address], [place], [place] [address]    Special Requests   Final    BOTTLES DRAWN AEROBIC AND ANAEROBIC Blood Culture results may not be optimal due to an excessive volume of blood received in culture bottles Performed at [facility] [address] [place], [place] [address]    Culture   Final    NO GROWTH 5 DAYS Performed at [facility], [place] [address] [place], [place] [address]    Report Status 02/17/2018 FINAL  Final  Blood Culture (routine x 2)     Status: None   Collection Time: 02/12/18  4:59 PM  Result Value Ref Range Status   Specimen Description   Final    BLOOD LEFT ANTECUBITAL Performed at [facility] [address], [place], [place] [address]    Special Requests   Final    BOTTLES DRAWN AEROBIC AND ANAEROBIC Blood Culture adequate volume Performed at [facility] [address] [place], [place] [address]    Culture   Final    NO GROWTH 5  DAYS Performed at [facility], [place] [address], [place], [place] [address]    Report Status 02/17/2018 FINAL  Final  MRSA PCR Screening     Status: Abnormal   Collection Time: 02/12/18 11:42 PM  Result Value Ref Range Status   MRSA by PCR POSITIVE (A) NEGATIVE Final    Comment:        The GeneXpert MRSA Assay (FDA approved for NASAL specimens only), is one component of a comprehensive MRSA colonization surveillance program. It is not intended to diagnose MRSA infection nor to guide or monitor treatment for MRSA infections. RESULT CALLED TO, READ BACK BY AND VERIFIED WITH: Coletta Memos 0177 02/13/18 MKELLY Performed at [facility], [place] [address], [place],  [address]   Respiratory Panel by PCR     Status: None   Collection Time: 02/19/18  8:27 AM  Result Value Ref Range Status   Adenovirus NOT DETECTED NOT DETECTED Final   Coronavirus 229E NOT DETECTED NOT DETECTED Final   Coronavirus HKU1 NOT DETECTED NOT DETECTED Final   Coronavirus NL63 NOT DETECTED NOT DETECTED Final   Coronavirus OC43 NOT DETECTED NOT DETECTED Final   Metapneumovirus NOT DETECTED NOT DETECTED Final   Rhinovirus / Enterovirus NOT DETECTED NOT DETECTED Final   Influenza A NOT DETECTED NOT DETECTED Final   Influenza B NOT DETECTED NOT DETECTED Final   Parainfluenza Virus 1 NOT DETECTED NOT DETECTED Final   Parainfluenza Virus 2 NOT DETECTED NOT DETECTED Final   Parainfluenza Virus 3 NOT DETECTED NOT DETECTED Final   Parainfluenza Virus 4 NOT DETECTED NOT DETECTED Final   Respiratory Syncytial Virus NOT DETECTED NOT DETECTED Final   Bordetella pertussis NOT DETECTED NOT DETECTED Final   Chlamydophila pneumoniae NOT DETECTED NOT DETECTED Final   Mycoplasma pneumoniae NOT DETECTED NOT DETECTED Final  Radiology Studies: No results found.    Scheduled Meds: . amLODipine  5 mg Oral Daily  . arformoterol  15 mcg Nebulization BID  . Chlorhexidine Gluconate Cloth   6 each Topical Q0600  . dexamethasone  4 mg Oral BID WC  . enoxaparin (LOVENOX) injection  60 mg Subcutaneous Q12H  . feeding supplement (ENSURE ENLIVE)  237 mL Oral BID BM  . guaiFENesin  1,200 mg Oral BID  . insulin aspart  0-5 Units Subcutaneous QHS  . insulin aspart  0-9 Units Subcutaneous TID WC  . ipratropium  0.5 mg Nebulization BID  . levalbuterol  0.63 mg Nebulization BID  . multivitamin with minerals  1 tablet Oral Daily  . mupirocin ointment  1 application Nasal BID  . pantoprazole  40 mg Oral Daily  . sertraline  50 mg Oral Daily   Continuous Infusions:    LOS: 10 days    Time spent in minutes: 35 minutes-      Debbe Odea, MD [facility] Pager: [email or web address] Password [email or web address] 02/22/2018, 1:39 PM

## 2018-02-23 ENCOUNTER — Encounter (HOSPITAL_COMMUNITY): Payer: Self-pay

## 2018-02-23 LAB — GLUCOSE, CAPILLARY
GLUCOSE-CAPILLARY: 87 mg/dL (ref 65–99)
GLUCOSE-CAPILLARY: 133 mg/dL — AB (ref 65–99)
GLUCOSE-CAPILLARY: 179 mg/dL — AB (ref 65–99)
Glucose-Capillary: 190 mg/dL — ABNORMAL HIGH (ref 65–99)

## 2018-02-23 NOTE — Progress Notes (Signed)
PT demonstrated hands on understanding of Flutter device. 

## 2018-02-23 NOTE — Clinical Social Work Note (Signed)
Clinical Social Work Assessment  Patient Details  Name: Beth Alexander MRN: [id number] Date of Birth: [birth year]  Date of referral:  02/23/18               Reason for consult:  Discharge Planning, Facility Placement                Permission sought to share information with:  Facility Sport and exercise psychologist, Family Supports Permission granted to share information::  Yes, Verbal Permission Granted  Name::     Interior and spatial designer::  SNFs  Relationship::  sister  Contact Information:  [phone]  Housing/Transportation Living arrangements for the past 2 months:  Single Family Home Source of Information:  Patient Patient Interpreter Needed:  None Criminal Activity/Legal Involvement Pertinent to Current Situation/Hospitalization:  No - Comment as needed Significant Relationships:  Siblings Lives with:  Siblings Do you feel safe going back to the place where you live?  Yes Need for family participation in patient care:  Yes (Comment)  Care giving concerns: Patient from home with brother. PT recommending SNF.    Social Worker assessment / plan: CSW received call from MD that patient now agreeable to SNF - previously refusing. CSW met with patient at bedside. Patient oriented, though with flat affect and slow responses. CSW discussed discharge planning. Patient agreeable to SNF. Patient indicated her POA is her sister, Beth Alexander. CSW left voicemail message for [facility], awaiting call back. CSW explained process of insurance authorization needed for SNF. CSW sent out initial referrals. CSW will follow up to provide bed offers. Patient will require [employer] authorization before admitting to facility.  Employment status:  Retired Astronomer) PT Recommendations:  [place] / Referral to community resources:  [place]  Patient/Family's Response to care: Not discussed.  Patient/Family's Understanding of and Emotional Response to  Diagnosis, Current Treatment, and Prognosis: Patient now agreeable to SNF, though did not express emotional response to treatment or discharge plan.  Emotional Assessment Appearance:  Appears stated age Attitude/Demeanor/Rapport:  Other(flat, slow to respond) Affect (typically observed):  Flat Orientation:  Oriented to Self, Oriented to Place, Oriented to  Time, Oriented to Situation Alcohol / Substance use:  Not Applicable Psych involvement (Current and /or in the community):  No (Comment)  Discharge Needs  Concerns to be addressed:  Care Coordination, Discharge Planning Concerns Readmission within the last 30 days:  No Current discharge risk:  Physical Impairment Barriers to Discharge:  Continued Medical Work up, [place], LCSW 02/23/2018, 10:33 AM

## 2018-02-23 NOTE — Progress Notes (Signed)
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   She was placed on antibiotics by her PCP which she took for about 2 days but presented to the ER she did not improve.  Lactic acid was 2.17- Temperature in the ED was 99 degrees.  Heart rate in the 1 teens and respiratory rate in the high 20s and 30s  Chest x-ray suggested left-sided infiltrates and therefore she was started on treatment for healthcare associated pneumonia causing acute respiratory failure. CTA of the chest obtained after admission revealed the following: Extensive infiltrates throughout the left lung Decreased size of right hilar mass with new patency of the right upper lobe bronchus and reinflation of the right upper lobe Metastatic disease in the right kidney right adrenal glands and right posterior chest wall   Subjective: No complaints today.  Assessment & Plan:   Principal Problem:   Sepsis / Acute respiratory failure with hypoxia/left pulmonary infiltrates -Tachycardia and tachypnea have improved- lactic acidosis has resolved - -Lactic acid level on admission was 2.17 as well as procalcitonin was 2.78.  Lactic acid level is improved to 1.2 now Procalcitonin is trended down to 0.51 suggesting that they may have been a bacterial infection -Strep and Legionella antigens negative -Influenza negative -MRSA PCR + -Blood cultures x2- -Patient was on Zithromax as outpatient recently for an upper respiratory tract infection -Treated for healthcare acquired pneumonia after being hospitalized but has been slow to improve with ongoing hypoxia with about 8 to 10 L of oxygen requirement and persistent left-sided interstitial infiltrate -diuretics have been  unhelpful-BNP was only 55 -pulmonary critical care consulted on 4/30-respiratory virus panel ordered -She has been on Decadron taper for her metastasis and per pulmonary this should be sufficient to treat pneumonitis secondary to medication - Vanco and cefepime switched to cefepime only  - respiratory virus panel neg- pulm has stopped antibiotics - she has been weaned to 2 L O2 but becomes very hypoxic with ambulation-  - palliative care consulted- she initially declined SNF but has left the decision making to her sister who has told palliative care that she will go to SNF with palliative care  Active Problems:  Acute DVT left leg On 4/26 venous duplex of the lower extremity also showed an acute left leg DVT in  External Iliac vein, Common Femoral vein, Peroneal veins, Soleal vein, and Gastrocnemius vein.  -   occurring in setting of metastatic cancer -Has been started on Lovenox - Dr Julien Nordmann aware and agrees  Severe deconditioning - PT eval- declines SNF- sister was trying to arrange private care at home but now states she will go to SNF- awaiting placement  Acute toxic encephalopathy -Secondary to respiratory failure and sepsis-has resolved    Squamous cell carcinoma of right lung stage IV with extensive metastasis -On chemotherapy status post first cycle on 4/20 -Status post radiation to brain metastasis and radiation to the right upper lobe which was completed on 3/29 -Quit smoking about 2 to 3 months ago - I have spoken with Dr Julien Nordmann- based on her current frail condition, he tells me he does not plan to continue chemo at this point-   Diabetes mellitus type 2 -A1c 6.7-continue sensitive sliding  scale insulin  Mildly elevated LFTs- fatty liver -Mildly elevated LFTs with an AST of 82 and ALT of 90-have normalized now and may have been related to sepsis -Noted to have fatty liver on right upper quadrant ultrasound - hepatitis panel neg  Constipation -MiraLAX  Anemia of  chronic disease  Ref. Range 02/14/2018 08:07  Iron Latest Ref Range: 28 - 170 ug/dL 44  UIBC Latest Units: ug/dL 130  TIBC Latest Ref Range: 250 - 450 ug/dL 174 (L)  Saturation Ratios Latest Ref Range: 10.4 - 31.8 % 25  Ferritin Latest Ref Range: 11 - 307 ng/mL 1,237 (H)  Folate Latest Ref Range: >5.9 ng/mL 9.7  Vitamin B12 Latest Ref Range: 180 - 914 pg/mL 1,634 (H)   Hypertension -On amlodipine    DVT prophylaxis: Full dose Lovenox Code Status: DNR- will need to go home with gold DNR form Family Communication: sister [name] Disposition Plan: SNF when bed found  Consultants:   Pulmonary critical care Procedures:   2D echo 4/27 Study Conclusions  - Left ventricle: The cavity size was normal. There was mild   concentric hypertrophy. Systolic function was normal. The   estimated ejection fraction was in the range of 55% to 60%. Wall   motion was normal; there were no regional wall motion   abnormalities. Doppler parameters are consistent with abnormal   left ventricular relaxation (grade 1 diastolic dysfunction).   There was no evidence of elevated ventricular filling pressure by   Doppler parameters. - Aortic root: The aortic root was normal in size. - Mitral valve: There was trivial regurgitation. - Right ventricle: Systolic function was normal. - Right atrium: The atrium was normal in size. - Tricuspid valve: There was mild regurgitation. - Pulmonic valve: There was trivial regurgitation. - Pulmonary arteries: Systolic pressure was mildly increased. PA   peak pressure: 39 mm Hg (S). - Inferior vena cava: The vessel was normal in size. - Pericardium, extracardiac: There was no pericardial effusion.  Lower extremity venous duplex left leg 4/26 Left: There is evidence of acute DVT in the External Iliac vein, Common Femoral vein, Peroneal veins, Soleal vein, and Gastrocnemius vein. There is no evidence of superficial venous thrombosis.  Antimicrobials:    Anti-infectives (From admission, onward)   Start     Dose/Rate Route Frequency Ordered Stop   02/13/18 1000  vancomycin (VANCOCIN) IVPB 1000 mg/200 mL premix  Status:  Discontinued     1,000 mg 200 mL/hr over 60 Minutes Intravenous Every 24 hours 02/12/18 2051 02/17/18 1346   02/13/18 0000  ceFEPIme (MAXIPIME) 1 g in sodium chloride 0.9 % 100 mL IVPB     1 g 200 mL/hr over 30 Minutes Intravenous Every 8 hours 02/12/18 2041 02/20/18 1348   02/12/18 1700  piperacillin-tazobactam (ZOSYN) IVPB 3.375 g     3.375 g 100 mL/hr over 30 Minutes Intravenous  Once 02/12/18 1647 02/12/18 1745   02/12/18 1700  vancomycin (VANCOCIN) IVPB 1000 mg/200 mL premix     1,000 mg 200 mL/hr over 60 Minutes Intravenous  Once 02/12/18 1647 02/12/18 1906       Objective: Vitals:   02/22/18 2043 02/22/18 2125 02/23/18 0437 02/23/18 0852  BP:  122/73 94/71   Pulse:  94 96   Resp:  20 20   Temp:  98.3 F (36.8 C) 98.5 F (36.9 C)   TempSrc:      SpO2: 94% 97% 99% 94%  Weight:      Height:  Intake/Output Summary (Last 24 hours) at 02/23/2018 1302 Last data filed at 02/22/2018 2200 Gross per 24 hour  Intake 240 ml  Output 650 ml  Net -410 ml   Filed Weights   02/12/18 1621 02/14/18 0425  Weight: 55.8 kg (123 lb) 57.3 kg (126 lb 5.2 oz)    Examination: General exam: Appears comfortable  HEENT: PERRLA, oral mucosa moist, no sclera icterus or thrush Respiratory system: Decreased breath sounds in the left mid and lower lung field-pulse ox is 96% on 2 L Cardiovascular system: S1 & S2 heard, RRR.   Gastrointestinal system: Abdomen soft, non-tender, nondistended. Normal bowel sound. No organomegaly Central nervous system: Alert and oriented. No focal neurological deficits. Extremities: No cyanosis, clubbing or edema Skin: No rashes or ulcers Psychiatry:  Mood & affect appropriate.   Data Reviewed: I have personally reviewed following labs and imaging studies  CBC: Recent Labs  Lab  02/17/18 0359 02/18/18 0357 02/19/18 0652 02/22/18 0511  WBC 10.2 11.2* 10.1 10.4  NEUTROABS 9.1* 10.2 8.9*  --   HGB 9.7* 9.8* 9.8* 9.8*  HCT 29.3* 29.2* 29.5* 29.3*  MCV 91.6 91.8 92.2 92.4  PLT 383 411* 440* 341*   Basic Metabolic Panel: Recent Labs  Lab 02/17/18 0359 02/18/18 0357 02/19/18 0652  NA 136 137 138  K 4.0 4.1 4.1  CL 98* 99* 99*  CO2 27 26 29   GLUCOSE 85 115* 95  BUN 12 14 15   CREATININE 0.37* 0.42* 0.38*  CALCIUM 9.0 8.8* 9.1  MG 1.8 1.8 1.9  PHOS 3.4 3.4 3.2   GFR: Estimated Creatinine Clearance: 58 mL/min (A) (by C-G formula based on SCr of 0.38 mg/dL (L)). Liver Function Tests: Recent Labs  Lab 02/17/18 0359 02/18/18 0357 02/19/18 0652  AST 32 29 26  ALT 54 52 47  ALKPHOS 90 81 79  BILITOT 0.3 0.3 0.4  PROT 6.6 6.3* 6.5  ALBUMIN 2.2* 2.3* 2.3*   No results for input(s): LIPASE, AMYLASE in the last 168 hours. No results for input(s): AMMONIA in the last 168 hours. Coagulation Profile: No results for input(s): INR, PROTIME in the last 168 hours. Cardiac Enzymes: No results for input(s): CKTOTAL, CKMB, CKMBINDEX, TROPONINI in the last 168 hours. BNP (last 3 results) No results for input(s): PROBNP in the last 8760 hours. HbA1C: No results for input(s): HGBA1C in the last 72 hours. CBG: Recent Labs  Lab 02/22/18 1224 02/22/18 1641 02/22/18 2120 02/23/18 0751 02/23/18 1204  GLUCAP 89 221* 108* 87 190*   Lipid Profile: No results for input(s): CHOL, HDL, LDLCALC, TRIG, CHOLHDL, LDLDIRECT in the last 72 hours. Thyroid Function Tests: No results for input(s): TSH, T4TOTAL, FREET4, T3FREE, THYROIDAB in the last 72 hours. Anemia Panel: No results for input(s): VITAMINB12, FOLATE, FERRITIN, TIBC, IRON, RETICCTPCT in the last 72 hours. Urine analysis:    Component Value Date/Time   COLORURINE AMBER (A) 02/12/2018 2042   APPEARANCEUR CLOUDY (A) 02/12/2018 2042   LABSPEC 1.028 02/12/2018 2042   PHURINE 5.0 02/12/2018 2042   GLUCOSEU  NEGATIVE 02/12/2018 2042   HGBUR NEGATIVE 02/12/2018 2042   BILIRUBINUR NEGATIVE 02/12/2018 2042   KETONESUR 5 (A) 02/12/2018 2042   PROTEINUR 100 (A) 02/12/2018 2042   NITRITE NEGATIVE 02/12/2018 2042   LEUKOCYTESUR TRACE (A) 02/12/2018 2042   Sepsis Labs: @LABRCNTIP (procalcitonin:4,lacticidven:4) ) Recent Results (from the past 240 hour(s))  Respiratory Panel by PCR     Status: None   Collection Time: 02/19/18  8:27 AM  Result Value Ref Range Status  Adenovirus NOT DETECTED NOT DETECTED Final   Coronavirus 229E NOT DETECTED NOT DETECTED Final   Coronavirus HKU1 NOT DETECTED NOT DETECTED Final   Coronavirus NL63 NOT DETECTED NOT DETECTED Final   Coronavirus OC43 NOT DETECTED NOT DETECTED Final   Metapneumovirus NOT DETECTED NOT DETECTED Final   Rhinovirus / Enterovirus NOT DETECTED NOT DETECTED Final   Influenza A NOT DETECTED NOT DETECTED Final   Influenza B NOT DETECTED NOT DETECTED Final   Parainfluenza Virus 1 NOT DETECTED NOT DETECTED Final   Parainfluenza Virus 2 NOT DETECTED NOT DETECTED Final   Parainfluenza Virus 3 NOT DETECTED NOT DETECTED Final   Parainfluenza Virus 4 NOT DETECTED NOT DETECTED Final   Respiratory Syncytial Virus NOT DETECTED NOT DETECTED Final   Bordetella pertussis NOT DETECTED NOT DETECTED Final   Chlamydophila pneumoniae NOT DETECTED NOT DETECTED Final   Mycoplasma pneumoniae NOT DETECTED NOT DETECTED Final         Radiology Studies: No results found.    Scheduled Meds: . amLODipine  5 mg Oral Daily  . arformoterol  15 mcg Nebulization BID  . Chlorhexidine Gluconate Cloth  6 each Topical Q0600  . dexamethasone  4 mg Oral BID WC  . enoxaparin (LOVENOX) injection  60 mg Subcutaneous Q12H  . feeding supplement (ENSURE ENLIVE)  237 mL Oral BID BM  . guaiFENesin  1,200 mg Oral BID  . insulin aspart  0-5 Units Subcutaneous QHS  . insulin aspart  0-9 Units Subcutaneous TID WC  . ipratropium  0.5 mg Nebulization BID  . levalbuterol  0.63  mg Nebulization BID  . multivitamin with minerals  1 tablet Oral Daily  . mupirocin ointment  1 application Nasal BID  . pantoprazole  40 mg Oral Daily  . sertraline  50 mg Oral Daily   Continuous Infusions:    LOS: 11 days    Time spent in minutes: 35 minutes-      Debbe Odea, MD [facility] Pager: [email or web address] Password [email or web address] 02/23/2018, 1:02 PM

## 2018-02-23 NOTE — NC FL2 (Signed)
[place] LEVEL OF CARE SCREENING TOOL     IDENTIFICATION  Patient Name: Beth Alexander Birthdate: [birth year] Sex: female Admission Date (Current Location): 02/12/2018  [facility] and [place] Number:  Herbalist and Address:  [facility], [place] [address], [place], [place] [address]      Provider Number: [id number]  Attending Physician Name and Address:  Debbe Odea, MD  Relative Name and Phone Number:       Current Level of Care: Hospital Recommended Level of Care: [place] Prior Approval Number:    Date Approved/Denied:   PASRR Number: [id number]  Discharge Plan: SNF    Current Diagnoses: Patient Active Problem List   Diagnosis Date Noted  . SOB (shortness of breath)   . Palliative care by specialist   . Malnutrition of moderate degree 02/20/2018  . Acute respiratory failure with hypoxia ([place]) 02/19/2018  . Pressure injury of skin 02/16/2018  . Sepsis ([place]) 02/12/2018  . Pneumonia of left lower lobe due to infectious organism ([place]) 02/12/2018  . Protein calorie malnutrition ([place]) 01/29/2018  . Candidiasis 01/29/2018  . Brain metastasis ([place]) 01/20/2018  . Malignant neoplasm of bronchus of right upper lobe ([place]) 01/20/2018  . Encounter for antineoplastic chemotherapy 01/15/2018  . Encounter for antineoplastic immunotherapy 01/15/2018  . Goals of care, counseling/discussion 12/31/2017  . Squamous cell carcinoma of right lung ([place]) 12/18/2017  . Lung mass 12/10/2017  . Pleural effusion on right 12/10/2017  . Hypokalemia 12/10/2017  . Hypertension   . Tobacco use   . 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    Orientation RESPIRATION BLADDER Height & Weight     Self, Time, Situation, Place  O2(nasal cannula 3L) Incontinent, External catheter Weight: 126 lb 5.2 oz (57.3 kg) Height:  5\' 3"  (160 cm)  BEHAVIORAL SYMPTOMS/MOOD NEUROLOGICAL BOWEL NUTRITION STATUS      Continent Diet(please see  DC summary)  AMBULATORY STATUS COMMUNICATION OF NEEDS Skin   Limited Assist Verbally PU Stage and Appropriate Care(PU stage II buttocks, foam dressing)                       Personal Care Assistance Level of Assistance  Bathing, Feeding, Dressing Bathing Assistance: Limited assistance Feeding assistance: Independent Dressing Assistance: Limited assistance     Functional Limitations Info  Sight, Hearing, Speech Sight Info: Adequate Hearing Info: Adequate Speech Info: Adequate    SPECIAL CARE FACTORS FREQUENCY  PT (By licensed PT), OT (By licensed OT)     PT Frequency: 5x/week OT Frequency: 5x/week            Contractures Contractures Info: Not present    Additional Factors Info  Code Status, Allergies, Psychotropic, Insulin Sliding Scale Code Status Info: DNR Allergies Info: No Known Allergies Psychotropic Info: zoloft Insulin Sliding Scale Info: novolog 3x/day with meals and at bedtime       Current Medications (02/23/2018):  This is the current hospital active medication list Current Facility-Administered Medications  Medication Dose Route Frequency Provider Last Rate Last Dose  . acetaminophen (TYLENOL) tablet 650 mg  650 mg Oral Q6H PRN Rise Patience, MD   650 mg at 02/20/18 1147   Or  . acetaminophen (TYLENOL) suppository 650 mg  650 mg Rectal Q6H PRN Rise Patience, MD      . amLODipine (NORVASC) tablet 5 mg  5 mg Oral Daily Rise Patience, MD   5 mg at 02/23/18 0908  . arformoterol (  BROVANA) nebulizer solution 15 mcg  15 mcg Nebulization BID Raiford Noble [place], DO   15 mcg at 02/23/18 4356  . Chlorhexidine Gluconate Cloth 2 % PADS 6 each  6 each Topical Q0600 Debbe Odea, MD   6 each at 02/21/18 [phone]  . dexamethasone (DECADRON) tablet 4 mg  4 mg Oral BID WC Sheikh, Georgina Quint [place], DO   4 mg at 02/23/18 0908  . enoxaparin (LOVENOX) injection 60 mg  60 mg Subcutaneous Q12H Shade, Haze Justin, RPH   60 mg at 02/23/18 0908  . feeding  supplement (ENSURE ENLIVE) (ENSURE ENLIVE) liquid 237 mL  237 mL Oral BID BM Rise Patience, MD   237 mL at 02/23/18 0909  . guaiFENesin (MUCINEX) 12 hr tablet 1,200 mg  1,200 mg Oral BID Raiford Noble [place], DO   1,200 mg at 02/23/18 8372  . guaiFENesin-dextromethorphan (ROBITUSSIN DM) 100-10 MG/5ML syrup 5 mL  5 mL Oral Q4H PRN Gardiner [place], NP   5 mL at 02/17/18 0013  . insulin aspart (novoLOG) injection 0-5 Units  0-5 Units Subcutaneous QHS Raiford Noble [place], [place]   2 Units at 02/20/18 2137  . insulin aspart (novoLOG) injection 0-9 Units  0-9 Units Subcutaneous TID WC SheikhGeorgina Quint [place], DO   3 Units at 02/22/18 1730  . ipratropium (ATROVENT) nebulizer solution 0.5 mg  0.5 mg Nebulization BID Raiford Noble [place], DO   0.5 mg at 02/23/18 9021  . levalbuterol (XOPENEX) nebulizer solution 0.63 mg  0.63 mg Nebulization BID Raiford Noble [place], DO   0.63 mg at 02/23/18 1155  . multivitamin with minerals tablet 1 tablet  1 tablet Oral Daily Debbe Odea, MD   1 tablet at 02/23/18 0909  . mupirocin ointment (BACTROBAN) 2 % 1 application  1 application Nasal BID Debbe Odea, MD   1 application at 20/80/22 0909  . ondansetron (ZOFRAN) tablet 4 mg  4 mg Oral Q6H PRN Rise Patience, MD       Or  . ondansetron [facility]) injection 4 mg  4 mg Intravenous Q6H PRN Rise Patience, MD      . oxyCODONE (Oxy IR/ROXICODONE) immediate release tablet 5 mg  5 mg Oral Q4H PRN Rise Patience, MD   5 mg at 02/17/18 0014  . pantoprazole (PROTONIX) EC tablet 40 mg  40 mg Oral Daily Raiford Noble [place], DO   40 mg at 02/23/18 3361  . sertraline (ZOLOFT) tablet 50 mg  50 mg Oral Daily Debbe Odea, MD   50 mg at 02/23/18 0909  . sodium chloride (OCEAN) 0.65 % nasal spray 1 spray  1 spray Each Nare PRN Bodenheimer, Charles A, NP      . white petrolatum (VASELINE) gel   Topical PRN Bodenheimer, Clenton Pare, NP         Discharge Medications: Please see discharge summary for a list of  discharge medications.  Relevant Imaging Results:  Relevant Lab Results:   Additional Information SSN: [id number]  Estanislado Emms, LCSW

## 2018-02-23 NOTE — Progress Notes (Signed)
CSW met with patient's sister, [name], at bedside after she arrived at [facility] indicated they prefer [facility] for rehab for patient. CSW spoke to admissions at [facility] and they have offered a bed and will start patient's [employer] authorization on Monday morning. Patient requires Holli Humbles before admitting to facility. CSW to follow and support.  Estanislado Emms, [place]

## 2018-02-24 ENCOUNTER — Encounter (HOSPITAL_COMMUNITY): Payer: Self-pay

## 2018-02-24 ENCOUNTER — Ambulatory Visit (HOSPITAL_COMMUNITY): Payer: Medicare HMO | Admitting: Psychiatry

## 2018-02-24 LAB — GLUCOSE, CAPILLARY
Glucose-Capillary: 86 mg/dL (ref 65–99)
Glucose-Capillary: 156 mg/dL — ABNORMAL HIGH (ref 65–99)
Glucose-Capillary: 174 mg/dL — ABNORMAL HIGH (ref 65–99)
Glucose-Capillary: 143 mg/dL — ABNORMAL HIGH (ref 65–99)

## 2018-02-24 NOTE — Progress Notes (Signed)
Physical Therapy Treatment Patient Details Name: Beth Alexander MRN: [id number] DOB: [birth year] Today's Date: 02/24/2018    History of Present Illness 67 yo female admitted with sepsis, L LE DVT. Hx of met NSCLC.    PT Comments    Improved activity tolerance noted on today. Pt remained on [place] O2-sats 94% during ambulation. Initiated LE exercises as well on today. Will continue to follow and progress activity as tolerated.    Follow Up Recommendations  SNF     Equipment Recommendations  Rolling walker with 5" wheels    Recommendations for Other Services       Precautions / Restrictions Precautions Precautions: Fall Precaution Comments: monitor O2 sats Restrictions Weight Bearing Restrictions: No    Mobility  Bed Mobility               General bed mobility comments: oob in recliner  Transfers Overall transfer level: Needs assistance Equipment used: Rolling walker (2 wheeled) Transfers: Sit to/from Stand Sit to Stand: Min assist         General transfer comment: Assist to rise, stabilize, control descent. VCs safety, hand placement  Ambulation/Gait Ambulation/Gait assistance: Min assist Ambulation Distance (Feet): 60 Feet Assistive device: Rolling walker (2 wheeled) Gait Pattern/deviations: Step-through pattern;Decreased stride length;Drifts right/left;Staggering right;Staggering left     General Gait Details: Assist to stabilize pt throughout distance and to maneuver safely with walker. O2 sats 94% on 4L [place] O2. Pt tolerated distance well   Stairs             Wheelchair Mobility    Modified Rankin (Stroke Patients Only)       Balance                                            Cognition Arousal/Alertness: Awake/alert Behavior During Therapy: WFL for tasks assessed/performed Overall Cognitive Status: Difficult to assess                                 General Comments: pt doesn't talk very much. A little  difficult getting info from her      Exercises General Exercises - Lower Extremity Ankle Circles/Pumps: AROM;Both;10 reps;Seated Quad Sets: AROM;Both;10 reps;Seated Long Arc Quad: AROM;Both;10 reps;Seated Hip Flexion/Marching: AROM;Both;10 reps;Seated    General Comments        Pertinent Vitals/Pain Pain Assessment: No/denies pain    Home Living                      Prior Function            PT Goals (current goals can now be found in the care plan section) Progress towards PT goals: Progressing toward goals    Frequency    Min 3X/week      PT Plan Current plan remains appropriate    Co-evaluation              AM-PAC PT "6 Clicks" Daily Activity  Outcome Measure  Difficulty turning over in bed (including adjusting bedclothes, sheets and blankets)?: A Little Difficulty moving from lying on back to sitting on the side of the bed? : A Little Difficulty sitting down on and standing up from a chair with arms (e.g., wheelchair, bedside commode, etc,.)?: Unable Help needed moving to and from a bed to chair (including a wheelchair)?:  A Little Help needed walking in hospital room?: A Little Help needed climbing 3-5 steps with a railing? : A Little 6 Click Score: 16    End of Session Equipment Utilized During Treatment: Gait belt;Oxygen Activity Tolerance: Patient tolerated treatment well Patient left: in chair;with call bell/phone within reach;with chair alarm set   PT Visit Diagnosis: Muscle weakness (generalized) (M62.81);Difficulty in walking, not elsewhere classified (R26.2)     Time: 1049-1100 PT Time Calculation (min) (ACUTE ONLY): 11 min  Charges:  $Gait Training: 8-22 mins                    G Codes:         Weston Anna, MPT Pager: [phone]

## 2018-02-24 NOTE — Progress Notes (Signed)
CSW following to assist with discharge planning to [facility]. CSW spoke with [facility] admissions staff Irine Seal, staff reported that [employer] authorization has been started. Patient's insurance authorization pending. CSW will continue to follow and assist with discharge planning.  Beth Alexander, [place] Social Worker [facility] Cell#: [phone]

## 2018-02-24 NOTE — Progress Notes (Signed)
Chaplain received call from pt's brother, Beth Alexander, who was working with attorney to prepare durable power of attorney documents.  Met with pt and brother in room 1419.  Pt was alert and oriented and demonstrated competency.  Pt speaking in short sentences, Described paperwork and verbalized that she wished Beth Alexander to be power of attorney.  Chaplain notarized documents with pt.     Provided spiritual support.  Beth Alexander asked chaplain what he thought about the word "surrender".  In conversation, he indicated that he wishes to ease his sister's worry and hopes for her to rely on God in this period.   He relies on his faith in order to frame support for his sister.   Beth Alexander requested prayers and indicated that she wished to pray for her spiritual wellbeing and her health. Chaplain shared prayers with this family.   Will follow up with [facility] for individual assessment.    [facility] Chaplain Jerene Pitch, MDiv, [facility]

## 2018-02-24 NOTE — Progress Notes (Signed)
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   She was placed on antibiotics by her PCP which she took for about 2 days but presented to the ER she did not improve.  Lactic acid was 2.17- Temperature in the ED was 99 degrees.  Heart rate in the 1 teens and respiratory rate in the high 20s and 30s  Chest x-ray suggested left-sided infiltrates and therefore she was started on treatment for healthcare associated pneumonia causing acute respiratory failure. 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   Assessment & Plan:   Principal Problem:   Sepsis / Acute respiratory failure with hypoxia/left pulmonary infiltrates -Tachycardia and tachypnea have improved- lactic acidosis has resolved - -Lactic acid level on admission was 2.17 as well as procalcitonin was 2.78.  Lactic acid level is improved to 1.2 now Procalcitonin is trended down to 0.51 suggesting that they may have been a bacterial infection -Strep and Legionella antigens negative -Influenza negative -MRSA PCR + -Blood cultures x2- -Patient was on Zithromax as outpatient recently for an upper respiratory tract infection -Treated for healthcare acquired pneumonia after being hospitalized but has been slow to improve with ongoing hypoxia with about 8 to 10 L of oxygen requirement and persistent left-sided interstitial infiltrate -diuretics have been  unhelpful-BNP was only 55 -pulmonary critical care consulted on 4/30-respiratory virus panel ordered -She has been on Decadron taper for her metastasis and per pulmonary this should be sufficient to treat pneumonitis secondary to medication - Vanco and cefepime switched to cefepime only  - respiratory virus panel neg- pulm has stopped antibiotics - she has been weaned to 2 L O2 but becomes very hypoxic with ambulation-  - palliative care consulted- she initially declined SNF but has left the decision making to her sister who has told palliative care that she will go to SNF with palliative care - awaiting bed  Active Problems:  Acute DVT left leg On 4/26 venous duplex of the lower extremity also showed an acute left leg DVT in  External Iliac vein, Common Femoral vein, Peroneal veins, Soleal vein, and Gastrocnemius vein.  -   occurring in setting of metastatic cancer -Has been started on Lovenox - Dr Julien Nordmann aware and agrees  Severe deconditioning - PT eval- declines SNF- sister was trying to arrange private care at home but now states she will go to SNF- awaiting placement  Acute toxic encephalopathy -Secondary to respiratory failure and sepsis-has resolved    Squamous cell carcinoma of right lung stage IV with extensive metastasis -On chemotherapy status post first cycle on 4/20 -Status post radiation to brain metastasis and radiation to the right upper lobe which was completed on 3/29 -Quit smoking about 2 to 3 months ago - I have spoken with Dr Julien Nordmann- based on her current frail condition, he tells me he does not plan to continue chemo at this point-   Diabetes mellitus type 2 -A1c  6.7-continue sensitive sliding scale insulin  Mildly elevated LFTs- fatty liver -Mildly elevated LFTs with an AST of 82 and ALT of 90-have normalized now and may have been related to sepsis -Noted to have fatty liver on right upper quadrant ultrasound - hepatitis panel  neg  Constipation -MiraLAX  Anemia of chronic disease  Ref. Range 02/14/2018 08:07  Iron Latest Ref Range: 28 - 170 ug/dL 44  UIBC Latest Units: ug/dL 130  TIBC Latest Ref Range: 250 - 450 ug/dL 174 (L)  Saturation Ratios Latest Ref Range: 10.4 - 31.8 % 25  Ferritin Latest Ref Range: 11 - 307 ng/mL 1,237 (H)  Folate Latest Ref Range: >5.9 ng/mL 9.7  Vitamin B12 Latest Ref Range: 180 - 914 pg/mL 1,634 (H)   Hypertension -On amlodipine    DVT prophylaxis: Full dose Lovenox Code Status: DNR- will need to go home with gold DNR form Family Communication: sister [name] Disposition Plan: SNF - awaiting insurance auth Consultants:   Pulmonary critical care Procedures:   2D echo 4/27 Study Conclusions  - Left ventricle: The cavity size was normal. There was mild   concentric hypertrophy. Systolic function was normal. The   estimated ejection fraction was in the range of 55% to 60%. Wall   motion was normal; there were no regional wall motion   abnormalities. Doppler parameters are consistent with abnormal   left ventricular relaxation (grade 1 diastolic dysfunction).   There was no evidence of elevated ventricular filling pressure by   Doppler parameters. - Aortic root: The aortic root was normal in size. - Mitral valve: There was trivial regurgitation. - Right ventricle: Systolic function was normal. - Right atrium: The atrium was normal in size. - Tricuspid valve: There was mild regurgitation. - Pulmonic valve: There was trivial regurgitation. - Pulmonary arteries: Systolic pressure was mildly increased. PA   peak pressure: 39 mm Hg (S). - Inferior vena cava: The vessel was normal in size. - Pericardium, extracardiac: There was no pericardial effusion.  Lower extremity venous duplex left leg 4/26 Left: There is evidence of acute DVT in the External Iliac vein, Common Femoral vein, Peroneal veins, Soleal vein, and Gastrocnemius vein. There is no evidence of  superficial venous thrombosis.  Antimicrobials:  Anti-infectives (From admission, onward)   Start     Dose/Rate Route Frequency Ordered Stop   02/13/18 1000  vancomycin (VANCOCIN) IVPB 1000 mg/200 mL premix  Status:  Discontinued     1,000 mg 200 mL/hr over 60 Minutes Intravenous Every 24 hours 02/12/18 2051 02/17/18 1346   02/13/18 0000  ceFEPIme (MAXIPIME) 1 g in sodium chloride 0.9 % 100 mL IVPB     1 g 200 mL/hr over 30 Minutes Intravenous Every 8 hours 02/12/18 2041 02/20/18 1348   02/12/18 1700  piperacillin-tazobactam (ZOSYN) IVPB 3.375 g     3.375 g 100 mL/hr over 30 Minutes Intravenous  Once 02/12/18 1647 02/12/18 1745   02/12/18 1700  vancomycin (VANCOCIN) IVPB 1000 mg/200 mL premix     1,000 mg 200 mL/hr over 60 Minutes Intravenous  Once 02/12/18 1647 02/12/18 1906       Objective: Vitals:   02/24/18 0432 02/24/18 1207 02/24/18 1216 02/24/18 1611  BP: 119/87   (!) 82/67  Pulse: 91   (!) 105  Resp: 20   18  Temp: 98.3 F (36.8 C)   97.7 F (36.5 C)  TempSrc: Oral   Oral  SpO2: 100% 96% 96% 90%  Weight:      Height:  Intake/Output Summary (Last 24 hours) at 02/24/2018 1635 Last data filed at 02/24/2018 1610 Gross per 24 hour  Intake 1160 ml  Output 1200 ml  Net -40 ml   Filed Weights   02/12/18 1621 02/14/18 0425  Weight: 55.8 kg (123 lb) 57.3 kg (126 lb 5.2 oz)    Examination: General exam: Appears comfortable  HEENT: PERRLA, oral mucosa moist, no sclera icterus or thrush Respiratory system: Decreased breath sounds in the left mid and lower lung field-pulse ox is 96% on 2 L Cardiovascular system: S1 & S2 heard, RRR.   Gastrointestinal system: Abdomen soft, non-tender, nondistended. Normal bowel sound. No organomegaly Central nervous system: Alert and oriented. No focal neurological deficits. Extremities: No cyanosis, clubbing or edema Skin: No rashes or ulcers Psychiatry:  Mood & affect appropriate.   Data Reviewed: I have personally reviewed  following labs and imaging studies  CBC: Recent Labs  Lab 02/18/18 0357 02/19/18 0652 02/22/18 0511  WBC 11.2* 10.1 10.4  NEUTROABS 10.2 8.9*  --   HGB 9.8* 9.8* 9.8*  HCT 29.2* 29.5* 29.3*  MCV 91.8 92.2 92.4  PLT 411* 440* 841*   Basic Metabolic Panel: Recent Labs  Lab 02/18/18 0357 02/19/18 0652  NA 137 138  K 4.1 4.1  CL 99* 99*  CO2 26 29  GLUCOSE 115* 95  BUN 14 15  CREATININE 0.42* 0.38*  CALCIUM 8.8* 9.1  MG 1.8 1.9  PHOS 3.4 3.2   GFR: Estimated Creatinine Clearance: 58 mL/min (A) (by C-G formula based on SCr of 0.38 mg/dL (L)). Liver Function Tests: Recent Labs  Lab 02/18/18 0357 02/19/18 0652  AST 29 26  ALT 52 47  ALKPHOS 81 79  BILITOT 0.3 0.4  PROT 6.3* 6.5  ALBUMIN 2.3* 2.3*   No results for input(s): LIPASE, AMYLASE in the last 168 hours. No results for input(s): AMMONIA in the last 168 hours. Coagulation Profile: No results for input(s): INR, PROTIME in the last 168 hours. Cardiac Enzymes: No results for input(s): CKTOTAL, CKMB, CKMBINDEX, TROPONINI in the last 168 hours. BNP (last 3 results) No results for input(s): PROBNP in the last 8760 hours. HbA1C: No results for input(s): HGBA1C in the last 72 hours. CBG: Recent Labs  Lab 02/23/18 1204 02/23/18 1710 02/23/18 2123 02/24/18 0718 02/24/18 1132  GLUCAP 190* 133* 179* 86 156*   Lipid Profile: No results for input(s): CHOL, HDL, LDLCALC, TRIG, CHOLHDL, LDLDIRECT in the last 72 hours. Thyroid Function Tests: No results for input(s): TSH, T4TOTAL, FREET4, T3FREE, THYROIDAB in the last 72 hours. Anemia Panel: No results for input(s): VITAMINB12, FOLATE, FERRITIN, TIBC, IRON, RETICCTPCT in the last 72 hours. Urine analysis:    Component Value Date/Time   COLORURINE AMBER (A) 02/12/2018 2042   APPEARANCEUR CLOUDY (A) 02/12/2018 2042   LABSPEC 1.028 02/12/2018 2042   PHURINE 5.0 02/12/2018 2042   GLUCOSEU NEGATIVE 02/12/2018 2042   HGBUR NEGATIVE 02/12/2018 2042   BILIRUBINUR  NEGATIVE 02/12/2018 2042   KETONESUR 5 (A) 02/12/2018 2042   PROTEINUR 100 (A) 02/12/2018 2042   NITRITE NEGATIVE 02/12/2018 2042   LEUKOCYTESUR TRACE (A) 02/12/2018 2042   Sepsis Labs: @LABRCNTIP (procalcitonin:4,lacticidven:4) ) Recent Results (from the past 240 hour(s))  Respiratory Panel by PCR     Status: None   Collection Time: 02/19/18  8:27 AM  Result Value Ref Range Status   Adenovirus NOT DETECTED NOT DETECTED Final   Coronavirus 229E NOT DETECTED NOT DETECTED Final   Coronavirus HKU1 NOT DETECTED NOT DETECTED Final   Coronavirus  NL63 NOT DETECTED NOT DETECTED Final   Coronavirus OC43 NOT DETECTED NOT DETECTED Final   Metapneumovirus NOT DETECTED NOT DETECTED Final   Rhinovirus / Enterovirus NOT DETECTED NOT DETECTED Final   Influenza A NOT DETECTED NOT DETECTED Final   Influenza B NOT DETECTED NOT DETECTED Final   Parainfluenza Virus 1 NOT DETECTED NOT DETECTED Final   Parainfluenza Virus 2 NOT DETECTED NOT DETECTED Final   Parainfluenza Virus 3 NOT DETECTED NOT DETECTED Final   Parainfluenza Virus 4 NOT DETECTED NOT DETECTED Final   Respiratory Syncytial Virus NOT DETECTED NOT DETECTED Final   Bordetella pertussis NOT DETECTED NOT DETECTED Final   Chlamydophila pneumoniae NOT DETECTED NOT DETECTED Final   Mycoplasma pneumoniae NOT DETECTED NOT DETECTED Final         Radiology Studies: No results found.    Scheduled Meds: . amLODipine  5 mg Oral Daily  . arformoterol  15 mcg Nebulization BID  . Chlorhexidine Gluconate Cloth  6 each Topical Q0600  . dexamethasone  4 mg Oral BID WC  . enoxaparin (LOVENOX) injection  60 mg Subcutaneous Q12H  . feeding supplement (ENSURE ENLIVE)  237 mL Oral BID BM  . guaiFENesin  1,200 mg Oral BID  . insulin aspart  0-5 Units Subcutaneous QHS  . insulin aspart  0-9 Units Subcutaneous TID WC  . ipratropium  0.5 mg Nebulization BID  . levalbuterol  0.63 mg Nebulization BID  . multivitamin with minerals  1 tablet Oral Daily   . mupirocin ointment  1 application Nasal BID  . pantoprazole  40 mg Oral Daily  . sertraline  50 mg Oral Daily   Continuous Infusions:    LOS: 12 days    Time spent in minutes: 35 minutes-      Debbe Odea, MD [facility] Pager: [email or web address] Password [facility] 02/24/2018, 4:35 PM

## 2018-02-25 ENCOUNTER — Encounter (HOSPITAL_COMMUNITY): Payer: Self-pay

## 2018-02-25 DIAGNOSIS — I82409 Acute embolism and thrombosis of unspecified deep veins of unspecified lower extremity: Secondary | ICD-10-CM

## 2018-02-25 LAB — CBC
HCT: 31.6 % — ABNORMAL LOW (ref 36.0–46.0)
Hemoglobin: 10.5 g/dL — ABNORMAL LOW (ref 12.0–15.0)
MCH: 30.8 pg (ref 26.0–34.0)
MCHC: 33.2 g/dL (ref 30.0–36.0)
MCV: 92.7 fL (ref 78.0–100.0)
PLATELETS: 490 10*3/uL — AB (ref 150–400)
RBC: 3.41 MIL/uL — ABNORMAL LOW (ref 3.87–5.11)
RDW: 16.2 % — AB (ref 11.5–15.5)
WBC: 11.9 10*3/uL — ABNORMAL HIGH (ref 4.0–10.5)

## 2018-02-25 LAB — CREATININE, SERUM
Creatinine, Ser: 0.38 mg/dL — ABNORMAL LOW (ref 0.44–1.00)
GFR calc Af Amer: >60 mL/min (ref >60)

## 2018-02-25 LAB — GLUCOSE, CAPILLARY
GLUCOSE-CAPILLARY: 151 mg/dL — AB (ref 65–99)
Glucose-Capillary: 113 mg/dL — ABNORMAL HIGH (ref 65–99)
Glucose-Capillary: 145 mg/dL — ABNORMAL HIGH (ref 65–99)
Glucose-Capillary: 149 mg/dL — ABNORMAL HIGH (ref 65–99)

## 2018-02-25 MED ORDER — ENSURE ENLIVE PO LIQD
237.0000 mL | ORAL | Status: DC
  Administered 2018-02-26: 237 mL via ORAL

## 2018-02-25 MED ORDER — ACETAMINOPHEN 325 MG PO TABS: 650.0000 mg | ORAL_TABLET | Freq: Four times a day (QID) | ORAL | Status: DC | PRN

## 2018-02-25 MED ORDER — ADULT MULTIVITAMIN W/MINERALS CH: 1.0000 | ORAL_TABLET | Freq: Every day | ORAL | Status: DC

## 2018-02-25 MED ORDER — BENZONATATE 100 MG PO CAPS: 100.0000 mg | ORAL_CAPSULE | Freq: Three times a day (TID) | ORAL | 0 refills | Status: DC | PRN

## 2018-02-25 MED ORDER — SALINE SPRAY 0.65 % NA SOLN: 1.0000 | NASAL | 0 refills | Status: DC | PRN

## 2018-02-25 MED ORDER — ENOXAPARIN SODIUM 60 MG/0.6ML ~~LOC~~ SOLN: 60.0000 mg | Freq: Two times a day (BID) | SUBCUTANEOUS | Status: DC

## 2018-02-25 MED ORDER — ENSURE ENLIVE PO LIQD: 237.0000 mL | Freq: Two times a day (BID) | ORAL | 12 refills | Status: DC

## 2018-02-25 MED ORDER — GUAIFENESIN ER 600 MG PO TB12: 1200.0000 mg | ORAL_TABLET | Freq: Two times a day (BID) | ORAL | Status: DC

## 2018-02-25 MED ORDER — ARFORMOTEROL TARTRATE 15 MCG/2ML IN NEBU: 15.0000 ug | INHALATION_SOLUTION | Freq: Two times a day (BID) | RESPIRATORY_TRACT | Status: DC

## 2018-02-25 NOTE — Care Management Note (Signed)
Case Management Note  Patient Details  Name: Beth Alexander MRN: [id number] Date of Birth: Apr 15, 1951  Subjective/Objective: Spoke to patient/sister in rm about d/c plan-SNF facility has not provided auth & patient ambulating 65ft min asst. Patient is not able to private pay for SNF-they are willing to d/c home w/HHC-HHRN/PT/OT/aide,social worker-chose [facility] will need home rw, & home 02-nurse to document 02 sats. Infomred of private duty care services(out of pocket cost)-they will have family to come & visit periodically.Paged PT per Dr. Wynelle Cleveland request to provide an update of recommendation since ambulating 65ft, min asst. Will await HHC orders,& dme orders.Sister will transport home on own.                   Action/Plan:d/c home w/HHC/dme   Expected Discharge Date:  02/15/18               Expected Discharge Plan:  [place]  In-House Referral:     Discharge planning Services  CM Consult  Post Acute Care Choice:    Choice offered to:  Sibling  DME Arranged:  Walker rolling, Oxygen DME Agency:  [place] Arranged:  RN, PT, OT, Nurse's Aide, Social Work [employer] Agency:  [place]  Status of Service:  Completed, signed off  If discussed at [employer], dates discussed:    Additional Comments:  [name], RN 02/25/2018, 1:36 PM

## 2018-02-25 NOTE — Progress Notes (Signed)
CSW contacted by [facility] admissions staff Irine Seal and informed that patient's [employer] authorization was denied. Patient's RNCM following to assist with discharge planning and home health services. CSW signing off, no other needs identified at this time.   Abundio Miu, [place] Social Worker [facility] Cell#: [phone]

## 2018-02-25 NOTE — Progress Notes (Signed)
Physical Therapy Treatment Patient Details Name: Beth Alexander MRN: [id number] DOB: 1951/06/23 Today's Date: 02/25/2018    History of Present Illness 67 yo female admitted with sepsis, L LE DVT. Hx of met NSCLC.    PT Comments    Pt in bed on 4 lts at 96%.  Assisted OOB to amb to bathroom required General transfer comment: Min Assist sit to stand from elevated bed but when assisted to bathroom, pt was unable to rise from lower level toilet.  Pt's B LE weak and required + 2 assist to rise.  General Gait Details: VERY unsteady gait with limited distance due to MAX c/o weakness/fatigue.  Pt was only able to amb to and from the bathroom with a near fall in bathroom transitioning from sit to stand.  HIGH FALL RISK. Pt remained on 4 lts avg sats 94 and HR increased to 118.  NT assisted and stated earlier, pt's knees buckled and it took 2 of them to transfer her.  Pt will need SNF prior to safely returning home.   Consulted evaluating LPT pt's performance.    Follow Up Recommendations  SNF     Equipment Recommendations       Recommendations for Other Services       Precautions / Restrictions Precautions Precautions: Fall Precaution Comments: monitor O2 sats  currently on 4 lts nasal Restrictions Weight Bearing Restrictions: No    Mobility  Bed Mobility Overal bed mobility: Needs Assistance Bed Mobility: Supine to Sit     Supine to sit: Min guard;Min assist     General bed mobility comments: MinGuard Assist OOB with increased time and difficulty self scooting due to fatigue.  Min Assist B LE up onto bed due to MAX c/o fatigue/weakness.    Transfers Overall transfer level: Needs assistance Equipment used: Rolling walker (2 wheeled) Transfers: Sit to/from [employer] Sit to Stand: Min assist;+2 physical assistance;+2 safety/equipment;Max assist         General transfer comment: Min Assist sit to stand from elevated bed but when assisted to bathroom, pt was unable  to rise from lower level toilet.  Pt's B LE weak and required + 2 assist to rise.    Ambulation/Gait Ambulation/Gait assistance: Min assist;Mod assist Ambulation Distance (Feet): 16 Feet(8 feet x 2 to and from bathroom only.) Assistive device: Rolling walker (2 wheeled) Gait Pattern/deviations: Step-to pattern;Decreased step length - right;Decreased step length - left;Drifts right/left Gait velocity: decreased   General Gait Details: VERY unsteady gait with limited distance due to MAX c/o weakness/fatigue.  Pt was only able to amb to and from the bathroom with a near fall in bathroom transitioning from sit to stand.  Pt remained on 4 lts avg sats 94 and HR increased to 118.     Stairs             Wheelchair Mobility    Modified Rankin (Stroke Patients Only)       Balance                                            Cognition Arousal/Alertness: Awake/alert Behavior During Therapy: Flat affect Overall Cognitive Status: Difficult to assess                                 General Comments: pt doesn't talk  very much. A little difficult getting info from her      Exercises      General Comments        Pertinent Vitals/Pain      Home Living                      Prior Function            PT Goals (current goals can now be found in the care plan section) Progress towards PT goals: Progressing toward goals    Frequency    Min 3X/week      PT Plan Current plan remains appropriate    Co-evaluation              AM-PAC PT "6 Clicks" Daily Activity  Outcome Measure  Difficulty turning over in bed (including adjusting bedclothes, sheets and blankets)?: A Lot Difficulty moving from lying on back to sitting on the side of the bed? : A Lot Difficulty sitting down on and standing up from a chair with arms (e.g., wheelchair, bedside commode, etc,.)?: A Lot Help needed moving to and from a bed to chair (including a  wheelchair)?: A Lot Help needed walking in hospital room?: A Lot Help needed climbing 3-5 steps with a railing? : Total 6 Click Score: 11    End of Session Equipment Utilized During Treatment: Gait belt;Oxygen Activity Tolerance: Patient limited by fatigue Patient left: in bed;with bed alarm set;with call bell/phone within reach;with nursing/sitter in room Nurse Communication: Mobility status(pt demeonstartes a decline in her mobility) PT Visit Diagnosis: Muscle weakness (generalized) (M62.81);Difficulty in walking, not elsewhere classified (R26.2)     Time: 4883-0141 PT Time Calculation (min) (ACUTE ONLY): 25 min  Charges:  $Gait Training: 8-22 mins $Therapeutic Activity: 8-22 mins                    G Codes:       Rica Koyanagi  PTA [facility]  Acute  Rehab Pager      [phone]

## 2018-02-25 NOTE — Progress Notes (Signed)
See updated d/c summary. Cannot go home as she is now barely able to stand without 2 ppl assisting her. Need to restart SNF search.   Debbe Odea, MD

## 2018-02-25 NOTE — Progress Notes (Signed)
Noted PT recent recc-SNF.MD/CSW notified.

## 2018-02-25 NOTE — Discharge Summary (Addendum)
Physician Discharge Summary  Beth Alexander XYB:[id number] DOB: 1951/10/08 DOA: 02/12/2018  PCP: [name], MD  Admit date: 02/12/2018 Discharge date: 02/25/2018  Admitted From: Home Disposition: Home with [facility] PT/OT/RN/Aide/SW  Recommendations for Outpatient Follow-up:  1.  [facility] will call them to discuss palliative care  2.  Healthcare power of attorney: Nani Skillern [phone].  F/u with Dr Julien Nordmann in 2 wks  ADDENDUM: Patient was seen and examined today and no acute changes overnight medically. She is doing well and still requires O2 via [place] at D/C. Patient was Discharged yesterday Home with [facility] as she was denied Ship broker for SNF, however recommendations changed yesterday because she was barely able to stand without 2 person max assist. She ambulated 60 feet with PT on 02/24/18 however needed 2+ Physical Assistance on 5/7/9 so D/C Home with [facility] was cancelled and Social worker was updated to start [place] search again. Today she was able to stand, pull her self up and ambulate to Bedside Commode and Chair with limited 1 person assist and required minimal assistance from nursing so she will be D/C'd home with [facility] PT,OT,RN, Nurse Aide, and Social Work.   Discharge Condition: Stable CODE STATUS: DO NOT RESUSCITATE Diet recommendation: Regular diet Consultations:  Pulmonary   Discharge Diagnoses:  Principal Problem:   Sepsis ([place]) Active Problems:   Pneumonia of left lower lobe due to infectious organism ([place])   Acute respiratory failure with hypoxia ([place])   Acute DVT (deep venous thrombosis) (H[facility])   Hypertension   Squamous cell carcinoma of right lung (H[facility])   Protein calorie malnutrition ([facility])   Pressure injury of skin   Malnutrition of moderate degree   Palliative care by specialist    Brief Summary: Beth Alexander is a 67 year old female with 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   She was placed on antibiotics by her PCP which she took for about 2 days but presented to the ER she did not improve.  Lactic acid was 2.17- Temperature in the ED was 99 degrees.  Heart rate in the 1 teens and respiratory rate in the high 20s and 30s  Chest x-ray suggested left-sided infiltrates and therefore she was started on treatment for healthcare associated pneumonia causing acute respiratory failure.  CTA of the chest obtained after admission revealed the following: Extensive infiltrates throughout the left lung Decreased size of right hilar mass with new patency of the right upper lobe bronchus and reinflation of the right upper lobe Metastatic disease in the right kidney right adrenal glands and right posterior chest wall    Hospital Course:  Principal Problem:   Sepsis / Acute respiratory failure with hypoxia/left pulmonary infiltrates -Tachycardia and tachypnea have improved- lactic acidosis has resolved - -Lactic acid level on admission was 2.17 as well as procalcitonin was 2.78. Lactic acid level is improved to 1.2 now Procalcitonin is trended down to 0.51 suggesting that there was likely a bacterial infection -Patient was on Zithromax as outpatient recently for an upper respiratory tract infection -Strep and Legionella antigens negative -Influenza negative -MRSA PCR + -Blood cultures x2- -Treated for healthcare acquired pneumonia after being hospitalized but has been slow to improve with ongoing hypoxia with about 8 to 10 L of oxygen requirement and persistent left-sided interstitial infiltrate -diuretics have been unhelpful-BNP was only 55 -pulmonary critical care  consulted on 4/30-respiratory virus panel ordered and found to be negative - Vanco and cefepime switched to cefepime only as procalcitonin was negative, pulmonary recommended discontinuing antibiotics which was done on 5/2  after a 7-day course -She has been on Decadron taper for her metastasis and per pulmonary this should be sufficient to treat pneumonitis secondary to medication - an ambulatory pulse ox assessment revealed that she needs 4 L O2  - palliative care consulted- she initially declined SNF but has left the decision making to her sister who has told palliative care that she will go to SNF with palliative care -5/6- ambulated 60 ft and no longer a candidate for SNF per her insurance- plan to d/c home with [facility] -5/7- could not get out of bed without max assist- PT asked to come back and reassess her and states she definitely needs to go to SNF -5/8 Patient able to get up independently and able to ambulate to [facility] and then bedside chair with limited 1 person assist as she was able to steady and pull herself up and stand when needed. Will not pursue SNF as she is limited 1 person assist and will D/C Home with Home Health today   Active Problems:  Acute DVT left leg On 4/26 venous duplex of the lower extremity also showed an acute left leg DVT in  External Iliac vein, Common Femoral vein, Peroneal veins, Soleal vein, and Gastrocnemius vein.  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 Earlie Server will need to reassess after rehab to see if chemo can or should be resumed  Severe deconditioning - PT eval- declines SNF- sister was trying to arrange private care at home and stated to me that their brother is home at all times and willing  to help take care of her but subsequently told palliative care she will go to SNF-  Acute toxic encephalopathy -Secondary to respiratory failure and sepsis-has resolved  Depression -The patient was started on Zoloft recently about 3 to 4 weeks ago -we will continue   Diabetes mellitus type 2 -A1c 6.7-continue sensitive sliding scale insulin  Mildly elevated LFTs- fatty liver -Mildly elevated LFTs with an AST of 82 and ALT of 90-have normalized now and may have been related to sepsis -Noted to have fatty liver on right upper quadrant ultrasound - hepatitis panel neg  Constipation -MiraLAX  Anemia of chronic disease-hemoglobin has been in the high 9-10 range throughout the hospital stay  Ref. Range 02/14/2018 08:07  Iron Latest Ref Range: 28 - 170 ug/dL 44  UIBC Latest Units: ug/dL 130  TIBC Latest Ref Range: 250 - 450 ug/dL 174 (L)  Saturation Ratios Latest Ref Range: 10.4 - 31.8 % 25  Ferritin Latest Ref Range: 11 - 307 ng/mL 1,237 (H)  Folate Latest Ref Range: >5.9 ng/mL 9.7  Vitamin B12 Latest Ref Range: 180 - 914 pg/mL 1,634 (H)   Hypertension -On amlodipine   Procedures:   2D echo 4/27 Study Conclusions  - Left  ventricle: The cavity size was normal. There was mild concentric hypertrophy. Systolic function was normal. The estimated ejection fraction was in the range of 55% to 60%. Wall motion was normal; there were no regional wall motion abnormalities. Doppler parameters are consistent with abnormal left ventricular relaxation (grade 1 diastolic dysfunction). There was no evidence of elevated ventricular filling pressure by Doppler parameters. - Aortic root: The aortic root was normal in size. - Mitral valve: There was trivial regurgitation. - Right ventricle: Systolic function was normal. - Right atrium: The atrium was normal in size. - Tricuspid valve: There was mild regurgitation. - Pulmonic valve: There was trivial regurgitation. -  Pulmonary arteries: Systolic pressure was mildly increased. PA peak pressure: 39 mm Hg (S). - Inferior vena cava: The vessel was normal in size. - Pericardium, extracardiac: There was no pericardial effusion.  Lower extremity venous duplex left leg 4/26 Left: There is evidence of acute DVT in the External Iliac vein, Common Femoral vein, Peroneal veins, Soleal vein, and Gastrocnemius vein. There is no evidence of superficial venous thrombosis.   Discharge Exam: Vitals:   02/25/18 0605 02/25/18 1349  BP: 112/82   Pulse: 97 98  Resp: 18   Temp: 97.8 F (36.6 C)   SpO2: 91% 100%   Vitals:   02/24/18 2301 02/24/18 2302 02/25/18 0605 02/25/18 1349  BP:   112/82   Pulse:   97 98  Resp:   18   Temp:   97.8 F (36.6 C)   TempSrc:   Oral   SpO2: 97% 97% 91% 100%  Weight:      Height:        General: Pt is alert, awake, not in acute distress Cardiovascular: RRR, S1/S2 +, no rubs, no gallops Respiratory: CTA bilaterally, no wheezing, no rhonchi Abdominal: Soft, NT, ND, bowel sounds + Extremities: no edema, no cyanosis   Discharge Instructions  Discharge Instructions    Increase activity slowly   Complete by:  As directed      Allergies as of 02/25/2018   No Known Allergies     Medication List    STOP taking these medications   amLODipine 5 MG tablet Commonly known as:  NORVASC   azithromycin 250 MG tablet Commonly known as:  ZITHROMAX   chlorpheniramine-HYDROcodone 10-8 MG/5ML Suer Commonly known as:  TUSSIONEX PENNKINETIC ER   fluconazole 100 MG tablet Commonly known as:  DIFLUCAN   LORazepam 0.5 MG tablet Commonly known as:  ATIVAN   nicotine 14 mg/24hr patch Commonly known as:  NICODERM CQ - dosed in mg/24 hours   oxyCODONE 5 MG immediate release tablet Commonly known as:  Oxy IR/ROXICODONE   prochlorperazine 10 MG tablet Commonly known as:  COMPAZINE     TAKE these medications   acetaminophen 500 MG tablet Commonly known as:  TYLENOL Take  500 mg by mouth every 6 (six) hours as needed for mild pain or headache. What changed:  Another medication with the same name was added. Make sure you understand how and when to take each.   acetaminophen 325 MG tablet Commonly known as:  TYLENOL Take 2 tablets (650 mg total) by mouth every 6 (six) hours as needed for mild pain (or Fever >/= 101). What changed:  You were already taking a medication with the same name, and this prescription was added. Make sure you understand how and when to take each.   arformoterol 15 MCG/2ML Nebu Commonly known as:  BROVANA Take 2 mLs (15 mcg total) by nebulization  2 (two) times daily.   benzonatate 100 MG capsule Commonly known as:  TESSALON Take 1 capsule (100 mg total) by mouth 3 (three) times daily as needed for cough.   dexamethasone 4 MG tablet Commonly known as:  DECADRON Take 1 tablet (4 mg total) by mouth 2 (two) times daily with a meal.   enoxaparin 60 MG/0.6ML injection Commonly known as:  LOVENOX Inject 0.6 mLs (60 mg total) into the skin every 12 (twelve) hours.   feeding supplement (ENSURE ENLIVE) Liqd Take 237 mLs by mouth 2 (two) times daily between meals.   guaiFENesin 600 MG 12 hr tablet Commonly known as:  MUCINEX Take 2 tablets (1,200 mg total) by mouth 2 (two) times daily.   multivitamin with minerals Tabs tablet Take 1 tablet by mouth daily. Start taking on:  02/26/2018   sertraline 50 MG tablet Commonly known as:  ZOLOFT Take 1/2 tab daily for 1 week and than full tab daily   sodium chloride 0.65 % Soln nasal spray Commonly known as:  OCEAN Place 1 spray into both nostrils as needed for congestion.            Durable Medical Equipment  (From admission, onward)        Start     Ordered   02/25/18 1357  For home use only DME Walker  Once    Question:  Patient needs a walker to treat with the following condition  Answer:  Muscular deconditioning   02/25/18 1356   02/20/18 1515  For home use only DME oxygen   Once    Question Answer Comment  Mode or (Route) Nasal cannula   Liters per Minute 2   Frequency Continuous (stationary and portable oxygen unit needed)   Oxygen conserving device Yes   Oxygen delivery system Gas      02/20/18 1515      Contact information for follow-up providers    [name], MD. Schedule an appointment as soon as possible for a visit in 1 week(s).   Specialty:  Internal Medicine Contact information: [place] [address] [phone]        Curt Bears, MD. Schedule an appointment as soon as possible for a visit.   Specialty:  Oncology Why:  2 wks Contact information: [place] [address] [phone]            Contact information for after-discharge care    Destination    [facility] .   Service:  Skilled Nursing Contact information: [place] [phone]                 No Known Allergies   Procedures/Studies:  Ct Head Wo Contrast  Result Date: 02/12/2018 CLINICAL DATA:  Altered level of consciousness, history of right thalamus brain metastasis, status post radiation EXAM: CT HEAD WITHOUT CONTRAST TECHNIQUE: Contiguous axial images were obtained from the base of the skull through the vertex without intravenous contrast. COMPARISON:  01/10/2018 FINDINGS: Brain: Stable 2 cm right thalamus hypodense lesion correlating with the known metastasis status post radiation. No acute intracranial hemorrhage, new infarction, midline shift, herniation, hydrocephalus, extra-axial fluid collection. No focal mass effect or edema. Cisterns are patent. No cerebellar abnormality. Vascular: No hyperdense vessel or unexpected calcification. Skull: Normal. Negative for fracture or focal lesion. Sinuses/Orbits: No acute finding. Other: None. IMPRESSION: Stable 2 cm hypodense right thalamus lesion. No acute intracranial abnormality by noncontrast CT.  Electronically Signed  By: Eugenie Filler M.D.   On: 02/12/2018 21:14   Ct Angio Chest Pe W Or Wo Contrast  Result Date: 02/14/2018 CLINICAL DATA:  Shortness of breath.  Metastatic lung cancer. EXAM: CT ANGIOGRAPHY CHEST WITH CONTRAST TECHNIQUE: Multidetector CT imaging of the chest was performed using the standard protocol during bolus administration of intravenous contrast. Multiplanar CT image reconstructions and MIPs were obtained to evaluate the vascular anatomy. 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 No discrete pulmonary emboli. Aortic atherosclerosis. Mediastinum/Nodes: Right hilar mass extending into the mediastinum is significantly smaller than on the prior study. Right upper lobe bronchus is now patent and the right upper lobe has reinflated. Lungs/Pleura: Extensive infiltrate throughout the left lung, most extensive in left upper lobe. Extensive emphysematous changes in the left lung. Pleural based soft tissue mass posteriorly at the right lung base best seen on image 60 of series 4 consistent with metastatic disease. This is slightly smaller than on the prior study. Upper Abdomen: Metastatic lesion in the upper pole of the right kidney. Nodularity of the right adrenal gland probably represents metastatic disease. Musculoskeletal: No chest wall abnormality. No acute or significant osseous findings. Review of the MIP images confirms the above findings. IMPRESSION: 1. No acute pulmonary emboli. 2. 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 3. Decreased size of right hilar mass with new patency of the right upper lobe bronchus with reinflation of the right upper lobe. 4. Extensive infiltrates throughout the left lung, most  prominent in the right upper lobe. 5. Aortic Atherosclerosis (ICD10-I70.0) and Emphysema (ICD10-J43.9). 6. Metastatic disease to the right kidney, right adrenal glands, and to the posterior right chest wall at the right lung base. Electronically Signed   By: Lorriane Shire M.D.   On: 02/14/2018 13:21   Dg Chest Port 1 View  Result Date: 02/19/2018 CLINICAL DATA:  Shortness of breath. History of lung malignancy, sepsis, former smoker. EXAM: PORTABLE CHEST 1 VIEW COMPARISON:  Chest x-ray of February 18, 2018 FINDINGS: The right hemidiaphragm is higher than the left and the right lung is hypoinflated. There is persistent right hilar density consistent with a known mass. The interstitial markings of the right lung are coarse. On the left diffusely increased lung markings are present. There is no confluent infiltrate. There is no pleural effusion. The heart and pulmonary vascularity are normal. The mediastinum is normal in width. IMPRESSION: Slight increase conspicuity of interstitial markings in the right lung accentuated by elevation of the hemidiaphragm. Persistent diffusely increased lung markings on the left. Overall there has not been dramatic interval change. Electronically Signed   By: David  Martinique M.D.   On: 02/19/2018 09:50   Dg Chest Port 1 View  Result Date: 02/18/2018 CLINICAL DATA:  67 year old female with a history of shortness of breath EXAM: PORTABLE CHEST 1 VIEW COMPARISON:  02/17/2018, 02/16/2018, CT 02/14/2018, CT 12/10/2017 FINDINGS: Cardiomediastinal silhouette unchanged in size and contour. Opacity in the right hilar region, similar to the comparison. Similar appearance of reticulonodular opacity of the left lung. No large pleural effusion. Low lung volumes. No displaced fracture. Right posterior chest wall mass with destructive bony changes is better visualized on prior chest CT. IMPRESSION: Similar appearance of the chest x-ray, with reticulonodular opacity throughout the left lung, similar  to prior. Differential includes edema, and/or infection, or potentially post treatment changes. Opacity in the right hilar region corresponds to mass on prior CT images. Electronically Signed   By: Corrie Mckusick D.O.   On: 02/18/2018 09:44   Dg Chest Port 1 View  Result Date: 02/17/2018 CLINICAL DATA:  Shortness of breath. EXAM: PORTABLE CHEST 1 VIEW COMPARISON:  Radiograph of February 16, 2018. FINDINGS: The heart size and mediastinal contours are within normal limits. Atherosclerosis of thoracic aorta is noted. No pneumothorax or pleural effusion is noted. Stable mild diffuse interstitial density seen in left lung consistent with inflammation or asymmetric edema. Stable minimal right basilar opacity is noted. The visualized skeletal structures are unremarkable. IMPRESSION: Stable mild diffuse interstitial left lung opacity is noted consistent with inflammation or asymmetric edema. Stable minimal right basilar opacity. Aortic Atherosclerosis (ICD10-I70.0). Electronically Signed   By: Marijo Conception, M.D.   On: 02/17/2018 07:21   Dg Chest Port 1 View  Result Date: 02/16/2018 CLINICAL DATA:  Lung cancer.  Brain metastases. EXAM: PORTABLE CHEST 1 VIEW COMPARISON:  February 14, 2018 FINDINGS: The cardiomediastinal silhouette is stable. No pneumothorax. Diffuse left infiltrate, similar to slightly improved in the interval. Patchy infiltrate in the right lung is stable. IMPRESSION: Minimal improvement of the diffuse left infiltrate. Mild patchy opacity in the right lung is stable. No other changes. Electronically Signed   By: Dorise Bullion III M.D   On: 02/16/2018 07:46   Dg Chest Port 1 View  Result Date: 02/14/2018 CLINICAL DATA:  Shortness of breath. EXAM: PORTABLE CHEST 1 VIEW COMPARISON:  Radiograph of February 12, 2018. FINDINGS: Stable cardiomediastinal silhouette. No pneumothorax is noted. Right lung is clear. Increased diffuse left lung opacity is noted concerning for worsening pneumonia or less likely  asymmetric edema. No significant pleural effusion is noted. Bony thorax is unremarkable. IMPRESSION: Increased diffuse left lung opacity is noted concerning for worsening pneumonia or less likely asymmetric edema. Electronically Signed   By: Marijo Conception, M.D.   On: 02/14/2018 08:52   Dg Chest Port 1 View  Result Date: 02/12/2018 CLINICAL DATA:  Increasing shortness of breath. Low oxygen saturation. EXAM: PORTABLE CHEST 1 VIEW COMPARISON:  02/12/2018 FINDINGS: Shallow inspiration with elevation of the right hemidiaphragm. Mild linear atelectasis in the lung bases. There is asymmetrical interstitial infiltration throughout the left lung. This could represent asymmetrical edema or interstitial pneumonia. Similar appearance to previous study. No pleural effusions. No pneumothorax. Heart size and pulmonary vascularity are normal. IMPRESSION: Asymmetric interstitial pattern throughout the left lung likely represents interstitial pneumonia or asymmetrical edema. No change. Electronically Signed   By: Lucienne Capers M.D.   On: 02/12/2018 22:14   Dg Chest Port 1 View  Result Date: 02/12/2018 CLINICAL DATA:  Weakness, shortness of Breath EXAM: PORTABLE CHEST 1 VIEW COMPARISON:  12/11/2017 FINDINGS: Diffuse interstitial prominence throughout the left lung. Right lung is clear. Heart is normal size. No effusions. No acute bony abnormality. IMPRESSION: Diffuse interstitial prominence throughout the left lung, likely pneumonia. Electronically Signed   By: Rolm Baptise M.D.   On: 02/12/2018 17:42   US Abdomen Limited Ruq  Result Date: 02/14/2018 CLINICAL DATA:  Abnormal LFTs EXAM: ULTRASOUND ABDOMEN LIMITED RIGHT UPPER QUADRANT COMPARISON:  None. FINDINGS: Gallbladder: Gallbladder is well distended. Mild gallbladder sludge is noted within. No gallstones are seen. No wall thickening is noted. Common bile duct: Diameter: 1.9 mm Liver: Mild increased echogenicity is identified consistent with fatty infiltration. No  focal mass is seen. Portal  vein is patent on color Doppler imaging with normal direction of blood flow towards the liver. IMPRESSION: Gallbladder sludge. Fatty liver. Electronically Signed   By: Inez Catalina M.D.   On: 02/14/2018 10:38     The results of significant diagnostics from this hospitalization (including imaging, microbiology, ancillary and laboratory) are listed below for reference.     Microbiology: Recent Results (from the past 240 hour(s))  Respiratory Panel by PCR     Status: None   Collection Time: 02/19/18  8:27 AM  Result Value Ref Range Status   Adenovirus NOT DETECTED NOT DETECTED Final   Coronavirus 229E NOT DETECTED NOT DETECTED Final   Coronavirus HKU1 NOT DETECTED NOT DETECTED Final   Coronavirus NL63 NOT DETECTED NOT DETECTED Final   Coronavirus OC43 NOT DETECTED NOT DETECTED Final   Metapneumovirus NOT DETECTED NOT DETECTED Final   Rhinovirus / Enterovirus NOT DETECTED NOT DETECTED Final   Influenza A NOT DETECTED NOT DETECTED Final   Influenza B NOT DETECTED NOT DETECTED Final   Parainfluenza Virus 1 NOT DETECTED NOT DETECTED Final   Parainfluenza Virus 2 NOT DETECTED NOT DETECTED Final   Parainfluenza Virus 3 NOT DETECTED NOT DETECTED Final   Parainfluenza Virus 4 NOT DETECTED NOT DETECTED Final   Respiratory Syncytial Virus NOT DETECTED NOT DETECTED Final   Bordetella pertussis NOT DETECTED NOT DETECTED Final   Chlamydophila pneumoniae NOT DETECTED NOT DETECTED Final   Mycoplasma pneumoniae NOT DETECTED NOT DETECTED Final     Labs: BNP (last 3 results) Recent Labs    02/12/18 2358  BNP 46.2   Basic Metabolic Panel: Recent Labs  Lab 02/19/18 0652 02/25/18 0550  NA 138  --   K 4.1  --   CL 99*  --   CO2 29  --   GLUCOSE 95  --   BUN 15  --   CREATININE 0.38* 0.38*  CALCIUM 9.1  --   MG 1.9  --   PHOS 3.2  --    Liver Function Tests: Recent Labs  Lab 02/19/18 0652  AST 26  ALT 47  ALKPHOS 79  BILITOT 0.4  PROT 6.5  ALBUMIN 2.3*    No results for input(s): LIPASE, AMYLASE in the last 168 hours. No results for input(s): AMMONIA in the last 168 hours. CBC: Recent Labs  Lab 02/19/18 0652 02/22/18 0511 02/25/18 0550  WBC 10.1 10.4 11.9*  NEUTROABS 8.9*  --   --   HGB 9.8* 9.8* 10.5*  HCT 29.5* 29.3* 31.6*  MCV 92.2 92.4 92.7  PLT 440* 500* 490*   Cardiac Enzymes: No results for input(s): CKTOTAL, CKMB, CKMBINDEX, TROPONINI in the last 168 hours. BNP: Invalid input(s): POCBNP CBG: Recent Labs  Lab 02/24/18 1132 02/24/18 1657 02/24/18 2121 02/25/18 0734 02/25/18 1133  GLUCAP 156* 174* 143* 113* 145*   D-Dimer No results for input(s): DDIMER in the last 72 hours. Hgb A1c No results for input(s): HGBA1C in the last 72 hours. Lipid Profile No results for input(s): CHOL, HDL, LDLCALC, TRIG, CHOLHDL, LDLDIRECT in the last 72 hours. Thyroid function studies No results for input(s): TSH, T4TOTAL, T3FREE, THYROIDAB in the last 72 hours.  Invalid input(s): FREET3 Anemia work up No results for input(s): VITAMINB12, FOLATE, FERRITIN, TIBC, IRON, RETICCTPCT in the last 72 hours. Urinalysis    Component Value Date/Time   COLORURINE AMBER (A) 02/12/2018 2042   APPEARANCEUR CLOUDY (A) 02/12/2018 2042   LABSPEC 1.028 02/12/2018 2042   PHURINE 5.0 02/12/2018 2042   GLUCOSEU NEGATIVE  02/12/2018 2042   HGBUR NEGATIVE 02/12/2018 2042   BILIRUBINUR NEGATIVE 02/12/2018 2042   KETONESUR 5 (A) 02/12/2018 2042   PROTEINUR 100 (A) 02/12/2018 2042   NITRITE NEGATIVE 02/12/2018 2042   LEUKOCYTESUR TRACE (A) 02/12/2018 2042   Sepsis Labs Invalid input(s): PROCALCITONIN,  WBC,  LACTICIDVEN Microbiology Recent Results (from the past 240 hour(s))  Respiratory Panel by PCR     Status: None   Collection Time: 02/19/18  8:27 AM  Result Value Ref Range Status   Adenovirus NOT DETECTED NOT DETECTED Final   Coronavirus 229E NOT DETECTED NOT DETECTED Final   Coronavirus HKU1 NOT DETECTED NOT DETECTED Final    Coronavirus NL63 NOT DETECTED NOT DETECTED Final   Coronavirus OC43 NOT DETECTED NOT DETECTED Final   Metapneumovirus NOT DETECTED NOT DETECTED Final   Rhinovirus / Enterovirus NOT DETECTED NOT DETECTED Final   Influenza A NOT DETECTED NOT DETECTED Final   Influenza B NOT DETECTED NOT DETECTED Final   Parainfluenza Virus 1 NOT DETECTED NOT DETECTED Final   Parainfluenza Virus 2 NOT DETECTED NOT DETECTED Final   Parainfluenza Virus 3 NOT DETECTED NOT DETECTED Final   Parainfluenza Virus 4 NOT DETECTED NOT DETECTED Final   Respiratory Syncytial Virus NOT DETECTED NOT DETECTED Final   Bordetella pertussis NOT DETECTED NOT DETECTED Final   Chlamydophila pneumoniae NOT DETECTED NOT DETECTED Final   Mycoplasma pneumoniae NOT DETECTED NOT DETECTED Final     Time coordinating discharge in minutes: 65  SIGNED:   Debbe Odea, MD  [facility] 02/25/2018, 2:01 PM Pager   If 7PM-7AM, please contact night-coverage [email or web address] Password [email or web address]

## 2018-02-25 NOTE — Progress Notes (Signed)
SATURATION QUALIFICATIONS: (This note is used to comply with regulatory documentation for home oxygen)  Patient Saturations on Room Air at Rest = 84%  Patient Saturations on Room Air while Ambulating = 80%  Patient Saturations on 4 Liters of oxygen while Ambulating = 98%  Please briefly explain why patient needs home oxygen:  Patient saturations dropped while at rest on room air as well as ambulating. Once placed on oxygen patient saturations were appropriate.

## 2018-02-25 NOTE — Care Management Important Message (Signed)
Important Message  Patient Details  Name: Janasha Barkalow MRN: [id number] Date of Birth: 1951/01/18   Medicare Important Message Given:  Yes    Kerin Salen 02/25/2018, 12:58 [place] Message  Patient Details  Name: Stacy Sailer MRN: [id number] Date of Birth: October 04, 1951   Medicare Important Message Given:  Yes    Kerin Salen 02/25/2018, 12:58 PM

## 2018-02-25 NOTE — Progress Notes (Signed)
Noted per Dr. Rowe Pavy note-[facility] to follow @ d/c. Palliative Care services through [facility] will follow @ home.TC [facility]-spoke to [place] for referral.They will contact HCPOA sister HRCBUL [phone].

## 2018-02-25 NOTE — Progress Notes (Signed)
Nutrition Follow-up  DOCUMENTATION CODES:   Non-severe (moderate) malnutrition in context of chronic illness  INTERVENTION:  - Continue Ensure Enlive but will decrease to once/day. - Continue to encourage PO intakes.  - Will order for new weight today.   NUTRITION DIAGNOSIS:   Moderate Malnutrition related to chronic illness, catabolic illness, cancer and cancer related treatments as evidenced by percent weight loss, moderate muscle depletion. -ongoing   GOAL:   Patient will meet greater than or equal to 90% of their needs -beginning to meet.   MONITOR:   PO intake, Supplement acceptance, Weight trends, Labs, Skin  ASSESSMENT:   67 y.o. 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  Patient has been having symptoms of upper respiratory infection over the last 4 days and states her son also had similar infection.  Patient had gone to her oncologist 2 days ago and was placed on antibiotics.  Today patient sister came to pick her up for appointment when patient was found to be weak and confused and was having chills.  Patient was brought to the ER.  Per review, pt consumed 100% of breakfast and 75% of lunch and dinner (1674 kcal, 55 grams of protein) on 5/5, 75% of breakfast and 100% of dinner (788 kcal, 29 grams of protein) yesterday, and 50% of breakfast (300 kcal, 10 grams of protein) today. She has been consuming Ensure Enlive (ordered BID) about 50% of the time it is provided. Intakes are much improved from when this RD last saw pt on 5/2. Zoloft started on 5/3.  No new weight since 4/26. Discharge summary in chart about 20 minutes ago.    Medications reviewed; sliding scale Novolog, daily multivitamin with minerals, 50 mg Zoloft/day,. Labs reviewed; CBGs: 113 and 145 mg/dL this AM, creatinine: 0.38 mg/dL.      Diet Order:   Diet Order           Diet Heart Room service appropriate? Yes; Fluid consistency: Thin  Diet effective now          EDUCATION NEEDS:   No education needs have been identified at this time  Skin:  Skin Assessment: Skin Integrity Issues: Skin Integrity Issues:: Stage II Stage II: R buttocks  Last BM:  5/3  Height:   Ht Readings from Last 1 Encounters:  02/12/18 5\' 3"  (1.6 m)    Weight:   Wt Readings from Last 1 Encounters:  02/14/18 126 lb 5.2 oz (57.3 kg)    Ideal Body Weight:  52.27 kg  BMI:  Body mass index is 22.38 kg/m.  Estimated Nutritional Needs:   Kcal:  1950-2120 (35-38 kcal/kg)  Protein:  85-95 grams (1.5-1.7 grams/kg)  Fluid:  >/= 2 L/day      Jarome Matin, MS, RD, LDN, [facility] Inpatient Clinical Dietitian Pager # [phone] After hours/weekend pager # [phone]

## 2018-02-26 ENCOUNTER — Inpatient Hospital Stay: Payer: Medicare HMO

## 2018-02-26 ENCOUNTER — Encounter (HOSPITAL_COMMUNITY): Payer: Self-pay | Admitting: Family Medicine

## 2018-02-26 ENCOUNTER — Emergency Department (HOSPITAL_COMMUNITY): Payer: Medicare HMO

## 2018-02-26 ENCOUNTER — Inpatient Hospital Stay (HOSPITAL_COMMUNITY)
Admission: EM | Admit: 2018-02-26 | Discharge: 2018-02-28 | DRG: 640 | Disposition: A | Discharge status: Home Health | Payer: Medicare HMO | Attending: Internal Medicine | Admitting: Internal Medicine

## 2018-02-26 DIAGNOSIS — R638 Other symptoms and signs concerning food and fluid intake: Secondary | ICD-10-CM

## 2018-02-26 DIAGNOSIS — Z79899 Other long term (current) drug therapy: Secondary | ICD-10-CM

## 2018-02-26 DIAGNOSIS — Z800 Family history of malignant neoplasm of digestive organs: Secondary | ICD-10-CM

## 2018-02-26 DIAGNOSIS — E860 Dehydration: Secondary | ICD-10-CM

## 2018-02-26 DIAGNOSIS — E871 Hypo-osmolality and hyponatremia: Secondary | ICD-10-CM

## 2018-02-26 DIAGNOSIS — C7901 Secondary malignant neoplasm of right kidney and renal pelvis: Secondary | ICD-10-CM | POA: Diagnosis present

## 2018-02-26 DIAGNOSIS — C7900 Secondary malignant neoplasm of unspecified kidney and renal pelvis: Secondary | ICD-10-CM | POA: Diagnosis present

## 2018-02-26 DIAGNOSIS — Z8249 Family history of ischemic heart disease and other diseases of the circulatory system: Secondary | ICD-10-CM

## 2018-02-26 DIAGNOSIS — J449 Chronic obstructive pulmonary disease, unspecified: Secondary | ICD-10-CM | POA: Diagnosis present

## 2018-02-26 DIAGNOSIS — C3411 Malignant neoplasm of upper lobe, right bronchus or lung: Secondary | ICD-10-CM | POA: Diagnosis present

## 2018-02-26 DIAGNOSIS — R5383 Other fatigue: Secondary | ICD-10-CM

## 2018-02-26 DIAGNOSIS — Z801 Family history of malignant neoplasm of trachea, bronchus and lung: Secondary | ICD-10-CM

## 2018-02-26 DIAGNOSIS — I959 Hypotension, unspecified: Secondary | ICD-10-CM

## 2018-02-26 DIAGNOSIS — E861 Hypovolemia: Principal | ICD-10-CM | POA: Diagnosis present

## 2018-02-26 DIAGNOSIS — Z8701 Personal history of pneumonia (recurrent): Secondary | ICD-10-CM

## 2018-02-26 DIAGNOSIS — C3491 Malignant neoplasm of unspecified part of right bronchus or lung: Secondary | ICD-10-CM | POA: Diagnosis present

## 2018-02-26 DIAGNOSIS — Z803 Family history of malignant neoplasm of breast: Secondary | ICD-10-CM

## 2018-02-26 DIAGNOSIS — I10 Essential (primary) hypertension: Secondary | ICD-10-CM | POA: Diagnosis present

## 2018-02-26 DIAGNOSIS — C7971 Secondary malignant neoplasm of right adrenal gland: Secondary | ICD-10-CM | POA: Diagnosis present

## 2018-02-26 DIAGNOSIS — I82492 Acute embolism and thrombosis of other specified deep vein of left lower extremity: Secondary | ICD-10-CM

## 2018-02-26 DIAGNOSIS — R627 Adult failure to thrive: Secondary | ICD-10-CM | POA: Diagnosis present

## 2018-02-26 DIAGNOSIS — I82402 Acute embolism and thrombosis of unspecified deep veins of left lower extremity: Secondary | ICD-10-CM | POA: Diagnosis present

## 2018-02-26 DIAGNOSIS — E46 Unspecified protein-calorie malnutrition: Secondary | ICD-10-CM | POA: Diagnosis present

## 2018-02-26 DIAGNOSIS — E43 Unspecified severe protein-calorie malnutrition: Secondary | ICD-10-CM | POA: Diagnosis present

## 2018-02-26 DIAGNOSIS — C7931 Secondary malignant neoplasm of brain: Secondary | ICD-10-CM | POA: Diagnosis present

## 2018-02-26 DIAGNOSIS — F329 Major depressive disorder, single episode, unspecified: Secondary | ICD-10-CM | POA: Diagnosis present

## 2018-02-26 DIAGNOSIS — C7989 Secondary malignant neoplasm of other specified sites: Secondary | ICD-10-CM | POA: Diagnosis present

## 2018-02-26 DIAGNOSIS — Z87891 Personal history of nicotine dependence: Secondary | ICD-10-CM

## 2018-02-26 DIAGNOSIS — I82409 Acute embolism and thrombosis of unspecified deep veins of unspecified lower extremity: Secondary | ICD-10-CM | POA: Diagnosis present

## 2018-02-26 LAB — COMPREHENSIVE METABOLIC PANEL
ALT: 63 U/L — AB (ref 14–54)
AST: 24 U/L (ref 15–41)
Albumin: 3 g/dL — ABNORMAL LOW (ref 3.5–5.0)
Alkaline Phosphatase: 68 U/L (ref 38–126)
Anion gap: 11 (ref 5–15)
BUN: 18 mg/dL (ref 6–20)
CHLORIDE: 94 mmol/L — AB (ref 101–111)
CO2: 28 mmol/L (ref 22–32)
CREATININE: 0.36 mg/dL — AB (ref 0.44–1.00)
Calcium: 9.3 mg/dL (ref 8.9–10.3)
GFR calc Af Amer: >60 mL/min (ref >60)
Glucose, Bld: 110 mg/dL — ABNORMAL HIGH (ref 65–99)
Potassium: 4.1 mmol/L (ref 3.5–5.1)
SODIUM: 133 mmol/L — AB (ref 135–145)
Total Bilirubin: 0.5 mg/dL (ref 0.3–1.2)
Total Protein: 6.6 g/dL (ref 6.5–8.1)

## 2018-02-26 LAB — CREATININE, SERUM
CREATININE: 0.44 mg/dL (ref 0.44–1.00)
GFR calc Af Amer: >60 mL/min (ref >60)

## 2018-02-26 LAB — GLUCOSE, CAPILLARY
GLUCOSE-CAPILLARY: 103 mg/dL — AB (ref 65–99)
Glucose-Capillary: 206 mg/dL — ABNORMAL HIGH (ref 65–99)

## 2018-02-26 LAB — I-STAT CG4 LACTIC ACID, ED: Lactic Acid, Venous: 1.41 mmol/L (ref 0.5–1.9)

## 2018-02-26 MED ORDER — ENOXAPARIN SODIUM 60 MG/0.6ML ~~LOC~~ SOLN: 60.0000 mg | Freq: Two times a day (BID) | SUBCUTANEOUS | 0 refills | Status: DC

## 2018-02-26 MED ORDER — ARFORMOTEROL TARTRATE 15 MCG/2ML IN NEBU: 15.0000 ug | INHALATION_SOLUTION | Freq: Two times a day (BID) | RESPIRATORY_TRACT | 0 refills | Status: DC

## 2018-02-26 MED ORDER — ENSURE ENLIVE PO LIQD: 237.0000 mL | Freq: Two times a day (BID) | ORAL | 12 refills | Status: DC

## 2018-02-26 MED ORDER — SODIUM CHLORIDE 0.9 % IV BOLUS
500.0000 mL | Freq: Once | INTRAVENOUS | Status: AC
  Administered 2018-02-26: 500 mL via INTRAVENOUS

## 2018-02-26 MED ORDER — ADULT MULTIVITAMIN W/MINERALS CH: 1.0000 | ORAL_TABLET | Freq: Every day | ORAL | 0 refills | Status: DC

## 2018-02-26 MED ORDER — SALINE SPRAY 0.65 % NA SOLN: 1.0000 | NASAL | 0 refills | Status: DC | PRN

## 2018-02-26 MED ORDER — GUAIFENESIN ER 600 MG PO TB12: 1200.0000 mg | ORAL_TABLET | Freq: Two times a day (BID) | ORAL | 0 refills | Status: DC

## 2018-02-26 NOTE — Care Management Note (Signed)
Case Management Note  Patient Details  Name: Beth Alexander MRN: [id number] Date of Birth: Dec 11, 1950  Subjective/Objective: Per attending-d/c home w/HHC-[facility] rep Santiago Glad aware of d/c, & home 02,home rw order. No further CM needs.                   Action/Plan:d/c home w/HHC/dme   Expected Discharge Date:  02/25/18               Expected Discharge Plan:  [place]  In-House Referral:     Discharge planning Services  CM Consult  Post Acute Care Choice:    Choice offered to:  Sibling  DME Arranged:  [place] rolling, Oxygen DME Agency:  [place] Arranged:  RN, PT, OT, Nurse's Aide, Social Work [employer] Agency:  [place]  Status of Service:  Completed, signed off  If discussed at [employer], dates discussed:    Additional Comments:  [name], RN 02/26/2018, 11:29 AM

## 2018-02-26 NOTE — Progress Notes (Signed)
Patient was assisted up to [facility] and then to bedside chair, was steady and able to pull herself up, turn, and stand when needed with limited 1 person assist

## 2018-02-26 NOTE — ED Provider Notes (Signed)
[facility] DEPT Provider Note   CSN: [id number] Arrival date & time: 02/26/18  1907     History   Chief Complaint Chief Complaint  Patient presents with  . Failure To Thrive    HPI Beth Alexander is a 67 y.o. female.  The history is provided by the patient, a relative and medical records.  Illness  This is a new problem. The current episode started 12 to 24 hours ago. The problem occurs constantly. The problem has not changed since onset.Pertinent negatives include no chest pain, no abdominal pain, no headaches and no shortness of breath. Nothing aggravates the symptoms. Nothing relieves the symptoms. She has tried nothing for the symptoms. The treatment provided no relief.    Past Medical History:  Diagnosis Date  . Brain metastasis ([place])   . Hypertension   . lung ca dx'd 11/2017   xrt comp; chemo ongoing  . 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 Tobacco use     Patient Active Problem List   Diagnosis Date Noted  . Acute DVT (deep venous thrombosis) ([place]) 02/25/2018  . Palliative care by specialist   . Malnutrition of moderate degree 02/20/2018  . Acute respiratory failure with hypoxia ([place]) 02/19/2018  . Pressure injury of skin 02/16/2018  . Sepsis ([place]) 02/12/2018  . Pneumonia of left lower lobe due to infectious organism ([place]) 02/12/2018  . Protein calorie malnutrition ([place]) 01/29/2018  . Candidiasis 01/29/2018  . Brain metastasis ([place]) 01/20/2018  . Malignant neoplasm of bronchus of right upper lobe ([place]) 01/20/2018  . Encounter for antineoplastic chemotherapy 01/15/2018  . Encounter for antineoplastic immunotherapy 01/15/2018  . Goals of care, counseling/discussion 12/31/2017  . Squamous cell carcinoma of right lung ([place]) 12/18/2017  . Lung mass 12/10/2017  . Pleural effusion on right 12/10/2017  . Hypokalemia 12/10/2017  . Hypertension   . Tobacco use   . 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 BREAST LUMPECTOMY WITH RADIOACTIVE SEED LOCALIZATION Left 11/08/2016   Procedure: LEFT BREAST LUMPECTOMY WITH RADIOACTIVE SEED LOCALIZATION;  Surgeon: Donnie Mesa, MD;  Location: [facility];  Service: General;  Laterality: Left;  Marland Kitchen VIDEO BRONCHOSCOPY WITH ENDOBRONCHIAL ULTRASOUND N/A 12/11/2017   Procedure: VIDEO BRONCHOSCOPY WITH ENDOBRONCHIAL ULTRASOUND;  Surgeon: Marshell Garfinkel, MD;  Location: [facility];  Service: Pulmonary;  Laterality: N/A;     OB History    Gravida      Para      Term      Preterm      AB      Living  2     SAB      TAB      Ectopic      Multiple      Live Births               Home Medications    Prior to Admission medications   Medication Sig Start Date End Date Taking? Authorizing Provider  acetaminophen (TYLENOL) 325 MG tablet Take 2 tablets (650 mg total) by mouth every 6 (six) hours as needed for mild pain (or Fever >/= 101). 02/25/18  Yes Debbe Odea, MD  acetaminophen (TYLENOL) 500 MG tablet Take 500 mg by mouth every 6 (six) hours as needed for mild pain or headache.   Yes [provider]  arformoterol (BROVANA) 15 MCG/2ML NEBU Take 2 mLs (15 mcg total) by nebulization 2 (two) times daily. 02/26/18  Yes Sheikh,  Omair Latif, DO  benzonatate (TESSALON) 100 MG capsule Take 1 capsule (100 mg total) by mouth 3 (three) times daily as needed for cough. 02/25/18  Yes Debbe Odea, MD  dexamethasone (DECADRON) 4 MG tablet Take 1 tablet (4 mg total) by mouth 2 (two) times daily with a meal. 01/17/18  Yes Kyung Rudd, MD  enoxaparin (LOVENOX) 60 MG/0.6ML injection Inject 0.6 mLs (60 mg total) into the skin every 12 (twelve) hours. 02/26/18  Yes Sheikh, [place], DO  feeding supplement, ENSURE ENLIVE, (ENSURE ENLIVE) LIQD Take 237 mLs by mouth 2 (two) times daily between meals. 02/26/18  Yes Sheikh, Omair Latif, DO  guaiFENesin (MUCINEX) 600 MG 12 hr tablet Take 2 tablets (1,200 mg total) by mouth 2  (two) times daily. 02/26/18  Yes Sheikh, Omair Latif, DO  Multiple Vitamin (MULTIVITAMIN WITH MINERALS) TABS tablet Take 1 tablet by mouth daily. 02/26/18  Yes Sheikh, Omair Latif, DO  sertraline (ZOLOFT) 50 MG tablet Take 1/2 tab daily for 1 week and than full tab daily 01/30/18  Yes Arfeen, Arlyce Harman, MD  sodium chloride (OCEAN) 0.65 % SOLN nasal spray Place 1 spray into both nostrils as needed for congestion. 02/26/18  Yes Kerney Elbe, DO    Family History Family History  Problem Relation Age of Onset  . Breast cancer Mother   . Heart disease Father   . Lung cancer Brother   . Colon cancer Brother     Social History Social History   Tobacco Use  . Smoking status: Former Smoker    Packs/day: 0.50    Years: 15.00    Pack years: 7.50    Last attempt to quit: 12/10/2017    Years since quitting: 0.2  . Smokeless tobacco: Never Used  . Tobacco comment: quit smoking 12/10/17--12/18/17  Substance Use Topics  . Alcohol use: No  . Drug use: No     Allergies   Patient has no known allergies.   Review of Systems Review of Systems  Constitutional: Positive for appetite change (decreased) and fatigue. Negative for chills, diaphoresis and fever.  HENT: Negative for congestion.   Respiratory: Positive for cough. Negative for chest tightness, shortness of breath and wheezing.   Cardiovascular: Negative for chest pain, palpitations and leg swelling.  Gastrointestinal: Negative for abdominal pain, constipation, diarrhea, nausea and rectal pain.  Genitourinary: Negative for dysuria, flank pain and frequency.  Musculoskeletal: Negative for back pain, neck pain and neck stiffness.  Skin: Negative for rash and wound.  Neurological: Positive for light-headedness. Negative for weakness, numbness and headaches.  Psychiatric/Behavioral: Negative for agitation and confusion.  All other systems reviewed and are negative.    Physical Exam Updated Vital Signs BP 112/83   Pulse (!) 102   Temp  98.2 F (36.8 C) (Oral)   Resp 17   Ht 5\' 3"  (1.6 m)   Wt 57.3 kg (126 lb 5 oz)   SpO2 98%   BMI 22.38 kg/m   Physical Exam  Constitutional: She appears well-developed and well-nourished. No distress.  HENT:  Head: Normocephalic and atraumatic.  Mouth/Throat: Oropharynx is clear and moist. No oropharyngeal exudate.  Eyes: Pupils are equal, round, and reactive to light. Conjunctivae and EOM are normal.  Neck: Normal range of motion. Neck supple.  Cardiovascular: Normal rate and regular rhythm.  No murmur heard. Pulmonary/Chest: Effort normal. No respiratory distress. She has no wheezes. She has rhonchi (mild). She has no rales. She exhibits no tenderness.  Abdominal: Soft. She exhibits no distension. There  is no tenderness.  Musculoskeletal: She exhibits no edema or tenderness.  Lymphadenopathy:    She has no cervical adenopathy.  Neurological: She is alert. No sensory deficit. She exhibits normal muscle tone.  Skin: Skin is warm and dry. Capillary refill takes less than 2 seconds. She is not diaphoretic.  Psychiatric: She has a normal mood and affect.  Nursing note and vitals reviewed.    ED Treatments / Results  Labs (all labs ordered are listed, but only abnormal results are displayed) Labs Reviewed  CBC WITH DIFFERENTIAL/PLATELET - Abnormal; Notable for the following components:      Result Value   WBC 12.7 (*)    RBC 3.50 (*)    Hemoglobin 10.8 (*)    HCT 31.8 (*)    RDW 16.0 (*)    Platelets 457 (*)    Neutro Abs 11.1 (*)    All other components within normal limits  COMPREHENSIVE METABOLIC PANEL - Abnormal; Notable for the following components:   Sodium 133 (*)    Chloride 94 (*)    Glucose, Bld 110 (*)    Creatinine, Ser 0.36 (*)    Albumin 3.0 (*)    ALT 63 (*)    All other components within normal limits  URINALYSIS, ROUTINE W REFLEX MICROSCOPIC - Abnormal; Notable for the following components:   APPearance HAZY (*)    Glucose, UA 150 (*)    Ketones, ur  5 (*)    All other components within normal limits  URINE CULTURE  CULTURE, BLOOD (ROUTINE X 2)  CULTURE, BLOOD (ROUTINE X 2)  PROCALCITONIN  I-STAT CG4 LACTIC ACID, ED  I-STAT CG4 LACTIC ACID, ED    EKG None  Radiology Dg Chest 2 View  Result Date: 02/26/2018 CLINICAL DATA:  Sepsis, pneumonia, fatigue. EXAM: CHEST - 2 VIEW COMPARISON:  02/19/2018. FINDINGS: Stable exam, with BILATERAL pulmonary opacities, and RIGHT hilar mass. RIGHT hemidiaphragm elevation. No significant effusion or pneumothorax. Unchanged cardiac silhouette. No acute osseous findings. IMPRESSION: Stable chest. Electronically Signed   By: Staci Righter M.D.   On: 02/26/2018 23:08    Procedures Procedures (including critical care time)  Medications Ordered in ED Medications  sodium chloride 0.9 % bolus 500 mL (0 mLs Intravenous Stopped 02/26/18 2300)     Initial Impression / Assessment and Plan / ED Course  I have reviewed the triage vital signs and the nursing notes.  Pertinent labs & imaging results that were available during my care of the patient were reviewed by me and considered in my medical decision making (see chart for details).     Beth Alexander is a 67 y.o. female with a past medical history significant for stage IV lung cancer, brain metastasis, kidney metastasis, hypertension, recent DVT, and recent admission for pneumonia who was discharged today who presents with worsening fatigue, decreased oral intake, and dry cough.  Patient reports that she was discharged from the hospital today and due to her insurance not covering rehab, she was discharged home.  She says that after getting home she was unable to ambulate at all.  She says that she lives with family and they were unable to help her.  They say that when she tried to transfer from a bathroom to a chair she was unable to do so.  Patient has had increased fatigue and has not had any to eat or drink today.  She denies nausea vomiting, or abdominal pain  but does report some constipation.  She denies diarrhea.  She  denies any urinary symptoms at this time and denies any chest pain shortness of breath or palpitations.  She does report a mild dry cough that has improved since her discharge from pneumonia.  On arrival, patient was hypotensive and tachycardic.  Initial blood pressure was in the 80s.  Patient denies significant fevers today.    On exam, patient is very fatigued.  Patient's blood pressure is around 458 systolic on my evaluation.  Heart rate is around 100.  Patient's lungs had faint coarseness but no significant wheezing.  Abdomen was nontender.  Chest was nontender.  Patient had no significant edema in the legs seen.  As patient was discharged today I am concerned about patient's ability to take care of herself at home.  Family reports that they were unable to help her.  Given the patient's fatigue, continued cough, and decreased oral intake in the setting of tachycardia and low blood pressure, patient will work-up to look for recurrent occult infection or dehydration.  Patient will be given fluids during initial work-up.  Anticipate reassessment after work-up.  11:41 PM Patient's laboratory testing shows rising leukocytosis.  Similar anemia prior.  Given the patient's documented hypotension, earlier tachycardia, and worsening leukocytosis in the setting of discharge earlier today, I am concerned that the patient needs to be observed for continued infection.  Chest x-ray was negative, do not feel she has pneumonia at this time.  Still awaiting urinalysis.  Regardless of urinalysis findings, patient is unable to ambulate at home and chart review shows an addendum of the discharge summary saying that she was denied authorization for skilled nursing facility which is initially recommended.  It appears they were continuing to have a skilled nursing facility search however she was able to stand today prompting her to be safe for discharge home.  As  she says she is unable to ambulate with assist and has not had any home assistance yet, do not feel patient is safe for discharge home.   Hospitalist team will be called for admission for further watching for hypotension, maintaining hydration with decreased oral intake, and for continued PT/OT evaluation.   Final Clinical Impressions(s) / ED Diagnoses   Final diagnoses:  Dehydration  Hypotension, unspecified hypotension type  Other fatigue  Decreased oral intake    ED Discharge Orders    None     Clinical Impression: 1. Dehydration   2. Hypotension, unspecified hypotension type   3. Other fatigue   4. Decreased oral intake     Disposition: Admit  This note was prepared with assistance of Dragon voice recognition software. Occasional wrong-word or sound-a-like substitutions may have occurred due to the inherent limitations of voice recognition software.     Amaryah Mallen, Gwenyth Allegra, MD 02/27/18 [phone]

## 2018-02-26 NOTE — ED Notes (Signed)
Bed: KZ60 Expected date:  Expected time:  Means of arrival:  Comments: EMS/cancer FTT

## 2018-02-26 NOTE — ED Notes (Signed)
Patient transported to X-ray 

## 2018-02-26 NOTE — Progress Notes (Signed)
Patient given discharge, follow up, and medication instructions, verbalized understanding, IV removed, personal belongings with patient, family to transport home

## 2018-02-26 NOTE — ED Triage Notes (Signed)
Patient is from home and transported via [facility]. Patient has stage IV lung cancer and was discharged yesterday from [facility] for sepsis. When patient was discharged, she was denied nursing home placement due to insurance. Palliative Care has been consulted but has not seen patient. Family (sister-POA) is requesting patient come back to the hospital since they are not able to care for her at home. Per EMS, patient has no complaints.

## 2018-02-27 ENCOUNTER — Other Ambulatory Visit: Payer: Self-pay

## 2018-02-27 ENCOUNTER — Telehealth: Payer: Self-pay | Admitting: *Deleted

## 2018-02-27 ENCOUNTER — Telehealth: Payer: Self-pay | Admitting: Medical Oncology

## 2018-02-27 DIAGNOSIS — C7901 Secondary malignant neoplasm of right kidney and renal pelvis: Secondary | ICD-10-CM | POA: Diagnosis present

## 2018-02-27 DIAGNOSIS — I82492 Acute embolism and thrombosis of other specified deep vein of left lower extremity: Secondary | ICD-10-CM | POA: Diagnosis not present

## 2018-02-27 DIAGNOSIS — J449 Chronic obstructive pulmonary disease, unspecified: Secondary | ICD-10-CM | POA: Diagnosis present

## 2018-02-27 DIAGNOSIS — F329 Major depressive disorder, single episode, unspecified: Secondary | ICD-10-CM | POA: Diagnosis present

## 2018-02-27 DIAGNOSIS — C7900 Secondary malignant neoplasm of unspecified kidney and renal pelvis: Secondary | ICD-10-CM | POA: Diagnosis present

## 2018-02-27 DIAGNOSIS — E43 Unspecified severe protein-calorie malnutrition: Secondary | ICD-10-CM | POA: Diagnosis present

## 2018-02-27 DIAGNOSIS — E860 Dehydration: Secondary | ICD-10-CM | POA: Diagnosis present

## 2018-02-27 DIAGNOSIS — C7931 Secondary malignant neoplasm of brain: Secondary | ICD-10-CM | POA: Diagnosis present

## 2018-02-27 DIAGNOSIS — C7989 Secondary malignant neoplasm of other specified sites: Secondary | ICD-10-CM | POA: Diagnosis present

## 2018-02-27 DIAGNOSIS — E46 Unspecified protein-calorie malnutrition: Secondary | ICD-10-CM

## 2018-02-27 DIAGNOSIS — C3411 Malignant neoplasm of upper lobe, right bronchus or lung: Secondary | ICD-10-CM | POA: Diagnosis not present

## 2018-02-27 DIAGNOSIS — Z87891 Personal history of nicotine dependence: Secondary | ICD-10-CM | POA: Diagnosis not present

## 2018-02-27 DIAGNOSIS — Z803 Family history of malignant neoplasm of breast: Secondary | ICD-10-CM | POA: Diagnosis not present

## 2018-02-27 DIAGNOSIS — Z801 Family history of malignant neoplasm of trachea, bronchus and lung: Secondary | ICD-10-CM | POA: Diagnosis not present

## 2018-02-27 DIAGNOSIS — I959 Hypotension, unspecified: Secondary | ICD-10-CM

## 2018-02-27 DIAGNOSIS — Z800 Family history of malignant neoplasm of digestive organs: Secondary | ICD-10-CM | POA: Diagnosis not present

## 2018-02-27 DIAGNOSIS — I82402 Acute embolism and thrombosis of unspecified deep veins of left lower extremity: Secondary | ICD-10-CM | POA: Diagnosis present

## 2018-02-27 DIAGNOSIS — E871 Hypo-osmolality and hyponatremia: Secondary | ICD-10-CM | POA: Diagnosis present

## 2018-02-27 DIAGNOSIS — C3491 Malignant neoplasm of unspecified part of right bronchus or lung: Secondary | ICD-10-CM | POA: Diagnosis present

## 2018-02-27 DIAGNOSIS — Z8249 Family history of ischemic heart disease and other diseases of the circulatory system: Secondary | ICD-10-CM | POA: Diagnosis not present

## 2018-02-27 DIAGNOSIS — Z8701 Personal history of pneumonia (recurrent): Secondary | ICD-10-CM | POA: Diagnosis not present

## 2018-02-27 DIAGNOSIS — R638 Other symptoms and signs concerning food and fluid intake: Secondary | ICD-10-CM

## 2018-02-27 DIAGNOSIS — I10 Essential (primary) hypertension: Secondary | ICD-10-CM | POA: Diagnosis present

## 2018-02-27 DIAGNOSIS — Z79899 Other long term (current) drug therapy: Secondary | ICD-10-CM | POA: Diagnosis not present

## 2018-02-27 DIAGNOSIS — R627 Adult failure to thrive: Secondary | ICD-10-CM | POA: Diagnosis present

## 2018-02-27 DIAGNOSIS — C7971 Secondary malignant neoplasm of right adrenal gland: Secondary | ICD-10-CM | POA: Diagnosis present

## 2018-02-27 DIAGNOSIS — E861 Hypovolemia: Secondary | ICD-10-CM | POA: Diagnosis present

## 2018-02-27 LAB — CBC WITH DIFFERENTIAL/PLATELET
BASOS PCT: 0 %
Basophils Absolute: 0 10*3/uL (ref 0.0–0.1)
EOS ABS: 0 10*3/uL (ref 0.0–0.7)
Eosinophils Relative: 0 %
HCT: 31.8 % — ABNORMAL LOW (ref 36.0–46.0)
Hemoglobin: 10.8 g/dL — ABNORMAL LOW (ref 12.0–15.0)
LYMPHS PCT: 6 %
Lymphs Abs: 0.8 10*3/uL (ref 0.7–4.0)
MCH: 30.9 pg (ref 26.0–34.0)
MCHC: 34 g/dL (ref 30.0–36.0)
MCV: 90.9 fL (ref 78.0–100.0)
MONOS PCT: 6 %
Monocytes Absolute: 0.8 10*3/uL (ref 0.1–1.0)
Neutro Abs: 11.1 10*3/uL — ABNORMAL HIGH (ref 1.7–7.7)
Neutrophils Relative %: 88 %
PLATELETS: 457 10*3/uL — AB (ref 150–400)
RBC: 3.5 MIL/uL — ABNORMAL LOW (ref 3.87–5.11)
RDW: 16 % — ABNORMAL HIGH (ref 11.5–15.5)
WBC: 12.7 10*3/uL — ABNORMAL HIGH (ref 4.0–10.5)

## 2018-02-27 LAB — URINALYSIS, ROUTINE W REFLEX MICROSCOPIC
Bilirubin Urine: NEGATIVE
GLUCOSE, UA: 150 mg/dL — AB
HGB URINE DIPSTICK: NEGATIVE
Ketones, ur: 5 mg/dL — AB
Leukocytes, UA: NEGATIVE
Nitrite: NEGATIVE
Protein, ur: NEGATIVE mg/dL
SPECIFIC GRAVITY, URINE: 1.025 (ref 1.005–1.030)
pH: 5 (ref 5.0–8.0)

## 2018-02-27 LAB — BASIC METABOLIC PANEL WITH GFR
CO2: 27 mmol/L (ref 22–32)
Calcium: 8.8 mg/dL — ABNORMAL LOW (ref 8.9–10.3)
Creatinine, Ser: 0.32 mg/dL — ABNORMAL LOW (ref 0.44–1.00)
GFR calc Af Amer: >60 mL/min (ref >60)
GFR calc non Af Amer: >60 mL/min (ref >60)
Sodium: 135 mmol/L (ref 135–145)

## 2018-02-27 LAB — BASIC METABOLIC PANEL
Anion gap: 10 (ref 5–15)
BUN: 16 mg/dL (ref 6–20)
Chloride: 98 mmol/L — ABNORMAL LOW (ref 101–111)
Glucose, Bld: 80 mg/dL (ref 65–99)
Potassium: 3.6 mmol/L (ref 3.5–5.1)

## 2018-02-27 LAB — PROCALCITONIN: Procalcitonin: <0.1 ng/mL

## 2018-02-27 MED ORDER — ADULT MULTIVITAMIN W/MINERALS CH
1.0000 | ORAL_TABLET | Freq: Every day | ORAL | Status: DC
  Administered 2018-02-27 – 2018-02-28 (×2): 1 via ORAL
  Filled 2018-02-27 (×2): qty 1

## 2018-02-27 MED ORDER — ACETAMINOPHEN 500 MG PO TABS: 500.0000 mg | ORAL_TABLET | Freq: Four times a day (QID) | ORAL | Status: DC | PRN

## 2018-02-27 MED ORDER — SERTRALINE HCL 50 MG PO TABS
50.0000 mg | ORAL_TABLET | Freq: Every day | ORAL | Status: DC
  Administered 2018-02-27 – 2018-02-28 (×2): 50 mg via ORAL
  Filled 2018-02-27 (×2): qty 1

## 2018-02-27 MED ORDER — ONDANSETRON HCL 4 MG/2ML IJ SOLN: 4.0000 mg | Freq: Four times a day (QID) | INTRAMUSCULAR | Status: DC | PRN

## 2018-02-27 MED ORDER — TRAMADOL HCL 50 MG PO TABS
50.0000 mg | ORAL_TABLET | Freq: Four times a day (QID) | ORAL | Status: DC | PRN
  Administered 2018-02-27: 50 mg via ORAL
  Filled 2018-02-27: qty 1

## 2018-02-27 MED ORDER — SALINE SPRAY 0.65 % NA SOLN
1.0000 | NASAL | Status: DC | PRN
  Filled 2018-02-27: qty 44

## 2018-02-27 MED ORDER — ENSURE ENLIVE PO LIQD
237.0000 mL | Freq: Two times a day (BID) | ORAL | Status: DC
  Administered 2018-02-28: 237 mL via ORAL

## 2018-02-27 MED ORDER — ONDANSETRON HCL 4 MG PO TABS: 4.0000 mg | ORAL_TABLET | Freq: Four times a day (QID) | ORAL | Status: DC | PRN

## 2018-02-27 MED ORDER — ENOXAPARIN SODIUM 60 MG/0.6ML ~~LOC~~ SOLN
60.0000 mg | Freq: Two times a day (BID) | SUBCUTANEOUS | Status: DC
Start: 2018-02-27 — End: 2018-02-28
  Administered 2018-02-27 – 2018-02-28 (×3): 60 mg via SUBCUTANEOUS
  Filled 2018-02-27 (×4): qty 0.6

## 2018-02-27 MED ORDER — SENNA 8.6 MG PO TABS
1.0000 | ORAL_TABLET | Freq: Two times a day (BID) | ORAL | Status: DC
  Administered 2018-02-27 – 2018-02-28 (×3): 8.6 mg via ORAL
  Filled 2018-02-27 (×3): qty 1

## 2018-02-27 MED ORDER — BENZONATATE 100 MG PO CAPS: 100.0000 mg | ORAL_CAPSULE | Freq: Three times a day (TID) | ORAL | Status: DC | PRN

## 2018-02-27 MED ORDER — MAGNESIUM CITRATE PO SOLN: 1.0000 | Freq: Once | ORAL | Status: DC | PRN

## 2018-02-27 MED ORDER — ORAL CARE MOUTH RINSE
15.0000 mL | Freq: Two times a day (BID) | OROMUCOSAL | Status: DC
  Administered 2018-02-27 – 2018-02-28 (×2): 15 mL via OROMUCOSAL

## 2018-02-27 MED ORDER — DOCUSATE SODIUM 100 MG PO CAPS
100.0000 mg | ORAL_CAPSULE | Freq: Two times a day (BID) | ORAL | Status: DC
  Administered 2018-02-27 – 2018-02-28 (×3): 100 mg via ORAL
  Filled 2018-02-27 (×3): qty 1

## 2018-02-27 MED ORDER — ARFORMOTEROL TARTRATE 15 MCG/2ML IN NEBU
15.0000 ug | INHALATION_SOLUTION | Freq: Two times a day (BID) | RESPIRATORY_TRACT | Status: DC
  Administered 2018-02-27 – 2018-02-28 (×2): 15 ug via RESPIRATORY_TRACT
  Filled 2018-02-27 (×4): qty 2

## 2018-02-27 MED ORDER — SODIUM CHLORIDE 0.9 % IV SOLN
INTRAVENOUS | Status: DC
  Administered 2018-02-27 – 2018-02-28 (×4): via INTRAVENOUS

## 2018-02-27 MED ORDER — GUAIFENESIN ER 600 MG PO TB12
1200.0000 mg | ORAL_TABLET | Freq: Two times a day (BID) | ORAL | Status: DC
  Administered 2018-02-27 – 2018-02-28 (×3): 1200 mg via ORAL
  Filled 2018-02-27 (×3): qty 2

## 2018-02-27 MED ORDER — ARFORMOTEROL TARTRATE 15 MCG/2ML IN NEBU
15.0000 ug | INHALATION_SOLUTION | Freq: Two times a day (BID) | RESPIRATORY_TRACT | Status: DC
  Filled 2018-02-27: qty 2

## 2018-02-27 NOTE — Telephone Encounter (Signed)
Insurance will not pay for SNF, pt will be going home with family and home health resources. Does Dr Julien Nordmann know pts prognosis/lifespan?

## 2018-02-27 NOTE — ED Notes (Signed)
ED TO INPATIENT HANDOFF REPORT  Name/Age/Gender Beth Alexander 67 y.o. female  Code Status Code Status History    Date Active Date Inactive Code Status Order ID Comments User Context   02/20/2018 1522 02/26/2018 1755 DNR [id number]  Debbe Odea, MD Inpatient   02/12/2018 2041 02/20/2018 1522 Full Code [id number]  Rise Patience, MD ED   12/10/2017 0254 12/12/2017 1702 Full Code [id number]  Ivor Costa, MD ED    Questions for Most Recent Historical Code Status (Order [id number])    Question Answer Comment   In the event of cardiac or respiratory ARREST Do not call a "code blue"    In the event of cardiac or respiratory ARREST Do not perform Intubation, CPR, defibrillation or ACLS    In the event of cardiac or respiratory ARREST Use medication by any route, position, wound care, and other measures to relive pain and suffering. May use oxygen, suction and manual treatment of airway obstruction as needed for comfort.       Home/SNF/Other Home  Chief Complaint Weakness  Level of Care/Admitting Diagnosis ED Disposition    ED Disposition Condition [facility] Area: [facility] [[id number]]  Level of Care: Med-Surg [16]  Diagnosis: Hypotension [492010]  Admitting Physician: Cristy Folks [[id number]]  Attending Physician: Cristy Folks [[id number]]  Estimated length of stay: past midnight tomorrow  Certification:: I certify this patient will need inpatient services for at least 2 midnights  PT Class (Do Not Modify): Inpatient [101]  PT Acc Code (Do Not Modify): Private [1]       Medical History Past Medical History:  Diagnosis Date  . Brain metastasis ([place])   . Hypertension   . lung ca dx'd 11/2017   xrt comp; chemo ongoing  . 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 Tobacco use     Allergies No Known Allergies  IV Location/Drains/Wounds Patient Lines/Drains/Airways Status   Active Line/Drains/Airways    Name:   Placement date:   Placement time:   Site:    Days:   Peripheral IV 02/26/18 Right Forearm   02/26/18    2233    Forearm   1   External Urinary Catheter   02/14/18    2100    -   13   Pressure Injury 02/13/18 Stage II -  Partial thickness loss of dermis presenting as a shallow open ulcer with a red, pink wound bed without slough. several small circular areas of pressure injury over buttocks    02/13/18    0800     14          Labs/Imaging Results for orders placed or performed during the hospital encounter of 02/26/18 (from the past 48 hour(s))  CBC with Differential     Status: Abnormal   Collection Time: 02/26/18 10:09 PM  Result Value Ref Range   WBC 12.7 (H) 4.0 - 10.5 K/uL   RBC 3.50 (L) 3.87 - 5.11 MIL/uL   Hemoglobin 10.8 (L) 12.0 - 15.0 g/dL   HCT 31.8 (L) 36.0 - 46.0 %   MCV 90.9 78.0 - 100.0 fL   MCH 30.9 26.0 - 34.0 pg   MCHC 34.0 30.0 - 36.0 g/dL   RDW 16.0 (H) 11.5 - 15.5 %   Platelets 457 (H) 150 - 400 K/uL   Neutrophils Relative % 88 %   Lymphocytes Relative 6 %   Monocytes Relative 6 %   Eosinophils Relative 0 %   Basophils Relative 0 %  Neutro Abs 11.1 (H) 1.7 - 7.7 K/uL   Lymphs Abs 0.8 0.7 - 4.0 K/uL   Monocytes Absolute 0.8 0.1 - 1.0 K/uL   Eosinophils Absolute 0.0 0.0 - 0.7 K/uL   Basophils Absolute 0.0 0.0 - 0.1 K/uL   RBC Morphology POLYCHROMASIA PRESENT     Comment: RARE NRBCs   WBC Morphology MILD LEFT SHIFT (1-5% METAS, OCC MYELO, OCC BANDS)     Comment: Performed at [facility], [place] [address] [place], [place] [address]  Comprehensive metabolic panel     Status: Abnormal   Collection Time: 02/26/18 10:09 PM  Result Value Ref Range   Sodium 133 (L) 135 - 145 mmol/L   Potassium 4.1 3.5 - 5.1 mmol/L   Chloride 94 (L) 101 - 111 mmol/L   CO2 28 22 - 32 mmol/L   Glucose, Bld 110 (H) 65 - 99 mg/dL   BUN 18 6 - 20 mg/dL   Creatinine, Ser 0.36 (L) 0.44 - 1.00 mg/dL   Calcium 9.3 8.9 - 10.3 mg/dL   Total Protein 6.6 6.5 - 8.1 g/dL   Albumin 3.0 (L) 3.5 - 5.0 g/dL   AST 24  15 - 41 U/L   ALT 63 (H) 14 - 54 U/L   Alkaline Phosphatase 68 38 - 126 U/L   Total Bilirubin 0.5 0.3 - 1.2 mg/dL   GFR calc non Af Amer >60 >60 mL/min   GFR calc Af Amer >60 >60 mL/min    Comment: (NOTE) The eGFR has been calculated using the CKD EPI equation. This calculation has not been validated in all clinical situations. eGFR's persistently <60 mL/min signify possible Chronic Kidney Disease.    Anion gap 11 5 - 15    Comment: Performed at [facility], [place] [address], [place], [place] [address]  I-Stat CG4 Lactic Acid, ED     Status: None   Collection Time: 02/26/18 10:38 PM  Result Value Ref Range   Lactic Acid, Venous 1.41 0.5 - 1.9 mmol/L  Urinalysis, Routine w reflex microscopic     Status: Abnormal   Collection Time: 02/27/18 12:19 AM  Result Value Ref Range   Color, Urine YELLOW YELLOW   APPearance HAZY (A) CLEAR   Specific Gravity, Urine 1.025 1.005 - 1.030   pH 5.0 5.0 - 8.0   Glucose, UA 150 (A) NEGATIVE mg/dL   Hgb urine dipstick NEGATIVE NEGATIVE   Bilirubin Urine NEGATIVE NEGATIVE   Ketones, ur 5 (A) NEGATIVE mg/dL   Protein, ur NEGATIVE NEGATIVE mg/dL   Nitrite NEGATIVE NEGATIVE   Leukocytes, UA NEGATIVE NEGATIVE    Comment: Performed at [facility] [address] [place], [place] [address]  Procalcitonin - Baseline     Status: None   Collection Time: 02/27/18  1:15 AM  Result Value Ref Range   Procalcitonin <0.10 ng/mL    Comment:        Interpretation: PCT (Procalcitonin) <= 0.5 ng/mL: Systemic infection (sepsis) is not likely. Local bacterial infection is possible. (NOTE)       Sepsis PCT Algorithm           Lower Respiratory Tract                                      Infection PCT Algorithm    ----------------------------     ----------------------------         PCT <  0.25 ng/mL                PCT < 0.10 ng/mL         Strongly encourage             Strongly discourage   discontinuation of antibiotics     initiation of antibiotics    ----------------------------     -----------------------------       PCT 0.25 - 0.50 ng/mL            PCT 0.10 - 0.25 ng/mL               OR       >80% decrease in PCT            Discourage initiation of                                            antibiotics      Encourage discontinuation           of antibiotics    ----------------------------     -----------------------------         PCT >= 0.50 ng/mL              PCT 0.26 - 0.50 ng/mL               AND        <80% decrease in PCT             Encourage initiation of                                             antibiotics       Encourage continuation           of antibiotics    ----------------------------     -----------------------------        PCT >= 0.50 ng/mL                  PCT > 0.50 ng/mL               AND         increase in PCT                  Strongly encourage                                      initiation of antibiotics    Strongly encourage escalation           of antibiotics                                     -----------------------------                                           PCT <= 0.25 ng/mL  OR                                        > 80% decrease in PCT                                     Discontinue / Do not initiate                                             antibiotics Performed at [facility] [address], [place], [place] [address]    Dg Chest 2 View  Result Date: 02/26/2018 CLINICAL DATA:  Sepsis, pneumonia, fatigue. EXAM: CHEST - 2 VIEW COMPARISON:  02/19/2018. FINDINGS: Stable exam, with BILATERAL pulmonary opacities, and RIGHT hilar mass. RIGHT hemidiaphragm elevation. No significant effusion or pneumothorax. Unchanged cardiac silhouette. No acute osseous findings. IMPRESSION: Stable chest. Electronically Signed   By: Staci Righter M.D.   On: 02/26/2018 23:08    Pending Labs Unresulted Labs  (From admission, onward)   Start     Ordered   02/28/18 0800  Cortisol  Once,   R     02/27/18 0024   02/28/18 0500  Procalcitonin  Daily,   R     02/27/18 0020   02/26/18 2151  Urine culture  STAT,   STAT     02/26/18 2151   02/26/18 2151  Blood culture (routine x 2)  BLOOD CULTURE X 2,   STAT     02/26/18 2151   Signed and Held  CBC  Tomorrow morning,   R     Signed and Held   Signed and Held  Basic metabolic panel  Tomorrow morning,   R     Signed and Held      Vitals/Pain Today's Vitals   02/27/18 0000 02/27/18 0002 02/27/18 0030 02/27/18 0233  BP: 139/87 139/87 131/90 119/78  Pulse: 93 93 96 90  Resp: 16 16 19 12   Temp:      TempSrc:      SpO2: 99% 98% 99% 98%  Weight:      Height:      PainSc:        Isolation Precautions No active isolations  Medications Medications  sodium chloride 0.9 % bolus 500 mL (0 mLs Intravenous Stopped 02/26/18 2300)    Mobility non-ambulatory

## 2018-02-27 NOTE — H&P (Signed)
History and Physical    Beth Alexander XTK:[id number] DOB: [birth year] DOA: 02/26/2018  PCP: [name], MD   Patient coming from: Home    Chief Complaint: Weakness, hypotension  HPI: Beth Alexander is a 67 y.o. 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  She apparently was treated broadly with vancomycin and cefepime and treated for 7 days.  She was also tapered off her Decadron during this time and it was felt that this should be sufficient to treat pneumonitis secondary to her radiation therapy regimen.  She was also diagnosed with acute left leg DVT.  On review of the chart patient had a waxing and waning course with periods of requiring significant amounts of assistance and pending placement in a skilled nursing facility.  Unfortunately insurance company did not approve for this she was sent home with home health.  Since then patient has been unable to ambulate or to get out of bed.  She reports significant amounts of fatigue and weakness.  EMS was called and patient was noted to be hypotensive in the 80s.  She denies any chest pain, abdominal pain, nausea, vomiting, diarrhea, syncope/presyncope, fevers, cough, shortness of breath.  Of note she was discharged home on 4 L nasal cannula.  ED Course: In the ED patient was noted to have hypotension with 99M over 42A systolic, and tachycardia.  CBC showed a white count 12.7, mild hyponatremia and anemia.  Patient was admitted for hypotension, tachycardia and significant fatigue and weakness.  Review of Systems: As per HPI otherwise 10 point review of systems negative.    Past Medical History:  Diagnosis Date  .  Brain metastasis ([place])   . Hypertension   . lung ca dx'd 11/2017   xrt comp; chemo ongoing  . 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 Tobacco use     Past Surgical History:  Procedure Laterality Date  . BREAST LUMPECTOMY WITH RADIOACTIVE SEED LOCALIZATION Left 11/08/2016   Procedure: LEFT BREAST LUMPECTOMY WITH RADIOACTIVE SEED LOCALIZATION;  Surgeon: Donnie Mesa, MD;  Location: [facility];  Service: General;  Laterality: Left;  Marland Kitchen VIDEO BRONCHOSCOPY WITH ENDOBRONCHIAL ULTRASOUND N/A 12/11/2017   Procedure: VIDEO BRONCHOSCOPY WITH ENDOBRONCHIAL ULTRASOUND;  Surgeon: Marshell Garfinkel, MD;  Location: [facility];  Service: Pulmonary;  Laterality: N/A;     reports that she quit smoking about 2 months ago. She has a 7.50 pack-year smoking history. She has never used smokeless tobacco. She reports that she does not drink alcohol or use drugs.  No Known Allergies  Family History  Problem Relation Age of Onset  . Breast cancer Mother   . Heart disease Father   . Lung cancer Brother   . Colon cancer Brother    Unacceptable: Noncontributory, unremarkable, or negative. Acceptable: Family history reviewed and not pertinent (If you reviewed it)  Prior to Admission medications   Medication Sig Start Date End Date Taking? Authorizing Provider  acetaminophen (TYLENOL) 325 MG tablet Take 2 tablets (650 mg total) by mouth every 6 (six) hours as needed for mild pain (or Fever >/= 101). 02/25/18  Yes Debbe Odea, MD  acetaminophen (TYLENOL) 500 MG tablet Take 500 mg by mouth every 6 (six) hours as needed for mild  pain or headache.   Yes [provider]  arformoterol (BROVANA) 15 MCG/2ML NEBU Take 2 mLs (15 mcg total) by nebulization 2 (two) times daily. 02/26/18  Yes Sheikh, Omair Latif, DO  benzonatate (TESSALON) 100 MG capsule Take 1 capsule (100 mg total) by mouth 3 (three) times daily as needed for cough. 02/25/18  Yes Debbe Odea, MD  dexamethasone (DECADRON) 4 MG tablet Take 1 tablet (4 mg  total) by mouth 2 (two) times daily with a meal. 01/17/18  Yes Kyung Rudd, MD  enoxaparin (LOVENOX) 60 MG/0.6ML injection Inject 0.6 mLs (60 mg total) into the skin every 12 (twelve) hours. 02/26/18  Yes Sheikh, [place], DO  feeding supplement, ENSURE ENLIVE, (ENSURE ENLIVE) LIQD Take 237 mLs by mouth 2 (two) times daily between meals. 02/26/18  Yes Sheikh, Omair Latif, DO  guaiFENesin (MUCINEX) 600 MG 12 hr tablet Take 2 tablets (1,200 mg total) by mouth 2 (two) times daily. 02/26/18  Yes Sheikh, Omair Latif, DO  Multiple Vitamin (MULTIVITAMIN WITH MINERALS) TABS tablet Take 1 tablet by mouth daily. 02/26/18  Yes Sheikh, Omair Latif, DO  sertraline (ZOLOFT) 50 MG tablet Take 1/2 tab daily for 1 week and than full tab daily 01/30/18  Yes Arfeen, Arlyce Harman, MD  sodium chloride (OCEAN) 0.65 % SOLN nasal spray Place 1 spray into both nostrils as needed for congestion. 02/26/18  Yes Raiford Noble [place], [place]    Physical Exam: Vitals:   02/26/18 1941 02/26/18 2000 02/26/18 2221 02/27/18 0002  BP: (!) 87/64 112/83 129/90 139/87  Pulse: (!) 104 (!) 102 98 93  Resp: 17  20 16   Temp: 98.2 F (36.8 C)     TempSrc: Oral     SpO2: 99% 98% 97% 98%  Weight:      Height:        Constitutional: NAD, calm, comfortable Vitals:   02/26/18 1941 02/26/18 2000 02/26/18 2221 02/27/18 0002  BP: (!) 87/64 112/83 129/90 139/87  Pulse: (!) 104 (!) 102 98 93  Resp: 17  20 16   Temp: 98.2 F (36.8 C)     TempSrc: Oral     SpO2: 99% 98% 97% 98%  Weight:      Height:       Eyes: Anicteric sclera ENMT: Dry mucous membranes Neck: normal, supple Respiratory: Mildly increased work of breathing, scattered rhonchi worse on the left, diminished lung sounds on right and at bases Cardiovascular: Tachycardic, regular rhythm, no murmurs s.  Abdomen: no tenderness, no masses palpated. No hepatosplenomegaly. Bowel sounds positive.  Musculoskeletal: Trace lower extremity edema Skin: no rashes visible skin Neurologic: CN 2-12  grossly intact.  Moving all extremities Psychiatric: Normal judgment and insight. Alert and oriented x 3.  Depressed mood.     Labs on Admission: I have personally reviewed following labs and imaging studies  CBC: Recent Labs  Lab 02/22/18 0511 02/25/18 0550 02/26/18 2209  WBC 10.4 11.9* 12.7*  NEUTROABS  --   --  PENDING  HGB 9.8* 10.5* 10.8*  HCT 29.3* 31.6* 31.8*  MCV 92.4 92.7 90.9  PLT 500* 490* 322*   Basic Metabolic Panel: Recent Labs  Lab 02/25/18 0550 02/26/18 0512 02/26/18 2209  NA  --   --  133*  K  --   --  4.1  CL  --   --  94*  CO2  --   --  28  GLUCOSE  --   --  110*  BUN  --   --  18  CREATININE 0.38* 0.44 0.36*  CALCIUM  --   --  9.3   GFR: Estimated Creatinine Clearance: 58 mL/min (A) (by C-G formula based on SCr of 0.36 mg/dL (L)). Liver Function Tests: Recent Labs  Lab 02/26/18 2209  AST 24  ALT 63*  ALKPHOS 68  BILITOT 0.5  PROT 6.6  ALBUMIN 3.0*   No results for input(s): LIPASE, AMYLASE in the last 168 hours. No results for input(s): AMMONIA in the last 168 hours. Coagulation Profile: No results for input(s): INR, PROTIME in the last 168 hours. Cardiac Enzymes: No results for input(s): CKTOTAL, CKMB, CKMBINDEX, TROPONINI in the last 168 hours. BNP (last 3 results) No results for input(s): PROBNP in the last 8760 hours. HbA1C: No results for input(s): HGBA1C in the last 72 hours. CBG: Recent Labs  Lab 02/25/18 1133 02/25/18 1639 02/25/18 2123 02/26/18 0753 02/26/18 1152  GLUCAP 145* 151* 149* 103* 206*   Lipid Profile: No results for input(s): CHOL, HDL, LDLCALC, TRIG, CHOLHDL, LDLDIRECT in the last 72 hours. Thyroid Function Tests: No results for input(s): TSH, T4TOTAL, FREET4, T3FREE, THYROIDAB in the last 72 hours. Anemia Panel: No results for input(s): VITAMINB12, FOLATE, FERRITIN, TIBC, IRON, RETICCTPCT in the last 72 hours. Urine analysis:    Component Value Date/Time   COLORURINE AMBER (A) 02/12/2018 2042    APPEARANCEUR CLOUDY (A) 02/12/2018 2042   LABSPEC 1.028 02/12/2018 2042   PHURINE 5.0 02/12/2018 2042   GLUCOSEU NEGATIVE 02/12/2018 2042   HGBUR NEGATIVE 02/12/2018 2042   BILIRUBINUR NEGATIVE 02/12/2018 2042   KETONESUR 5 (A) 02/12/2018 2042   PROTEINUR 100 (A) 02/12/2018 2042   NITRITE NEGATIVE 02/12/2018 2042   LEUKOCYTESUR TRACE (A) 02/12/2018 2042    Radiological Exams on Admission: Dg Chest 2 View  Result Date: 02/26/2018 CLINICAL DATA:  Sepsis, pneumonia, fatigue. EXAM: CHEST - 2 VIEW COMPARISON:  02/19/2018. FINDINGS: Stable exam, with BILATERAL pulmonary opacities, and RIGHT hilar mass. RIGHT hemidiaphragm elevation. No significant effusion or pneumothorax. Unchanged cardiac silhouette. No acute osseous findings. IMPRESSION: Stable chest. Electronically Signed   By: Staci Righter M.D.   On: 02/26/2018 23:08    EKG: Independently reviewed.  Pending  Assessment/Plan Principal Problem:   Hypotension Active Problems:   Squamous cell carcinoma of right lung ([place])   #) Hypotension/tachycardia: Unclear if this is related to simple dehydration.  While certainly could also be a sign that she has an underlying infection that was either inadequately treated or possibly a new hospital-acquired infection either UTI or possible C. difficile she has no signs or symptoms of this.  Last possibility could be that she has adrenal suppression from completing her dexamethasone taper.  At this time she is responded well to a slight fluid challenge. - Gentle IV fluids - 8 AM cortisol ordered -Low threshold to start stress dose steroids  #) Hospital-acquired pneumonia: This appeared to resolve with a prior admission.  She does not have any infectious signs or symptoms and her chest x-ray is relatively unchanged from prior. - Repeat procalcitonin ordered  #) Left lower extremity DVT: Exline-continue enoxaparin 1 week per cake twice daily  #) COPD: Currently on 4 L nasal cannula however suspect  this will likely be able to be weaned. -Continue LABA -Continue PRN bronchodilators -Continue mucolytic and antitussive  #) Stage IV widely metastatic squamous cell carcinoma of the lung: Patient has received radiation treatment, weekly pembrolizumab infusions.  #) Depression: -Continue sertraline 50 mg daily -Patient has outpatient behavioral follow-up  #) Hypertension: Resolved, patient was  taken off her antihypertensives proximally 1 month ago.  Fluids: IV fluids x2 days Electrolytes: Monitor and supplement Nutrition: Regular diet  Prophylaxis: Treatment dose enoxaparin  Disposition: Pending PT evaluation, treatment of hypotension  DO NOT RESUSCITATE  Cristy Folks MD [facility]   If 7PM-7AM, please contact night-coverage [email or web address] Password [facility]  02/27/2018, 12:39 AM

## 2018-02-27 NOTE — Progress Notes (Signed)
CSW consulted for assistance with disposition- brief hx pt was discharged home from [facility] yesterday 02/26/18 - recommendation per therapy was for SNF however pt's [employer] Medicare denied SNF admission. Met with pt today- participated in therapy and again recommends pt's need is SNF level. Explained as insurance coverage for SNF denied, SNF placement would be out of pocket. Pt explains she cannot afford this and has applied for Medicaid as she is anticipating needing custodial SNF placement rather than short term rehab SNF admission (insurance denial aligns with this). Discussed what pt may need to be as successful as possible at home while planning next steps in coming weeks- pt reports she feels she needs someone with her to assist her with bathing and toileting. States she cannot afford private caregivers and lives with a brother who is unable to provide much assistance. Pt reports her sister who lives nearby is assisting with her care and requests CSW calls sister for planning as well. Sister discussed pt's home situation at length with CSW- was focused on feeling that insurance should cover pt's SNF stay no matter the goal of care- CSW worked with her to understand that insurance coverage for SNF is based on judgment that short term therapy will significantly improve pt's mobility which was not the decision of the insurance request made. Sister reports she is assisting pt with Medicaid application and hoping they will be able to secure payor source for pt moving into a facility. Sister reports she will be assisting pt as much as possible at home however maintains she feels "the hospital should be keeping her till something works out."  Pt reports she would like to engage with home health therapy at DC- was arranged at DC yesterday.  Will follow to assist as needed- plan: pt to return home with home health services at DC and will work toward securing payor source for long term care facility  care.  Complete assessment done 02/22/18 prior to previous DC included below for history.  Sharren Bridge, MSW, LCSW Clinical Social Work 02/27/2018 [phone]     Clinical Social Work Assessment  Patient Details  Name: Beth Alexander MRN: [id number] Date of Birth: 1951/05/29  Date of referral:  02/23/18               Reason for consult:  Discharge Planning, Facility Placement                    Permission sought to share information with:  Facility Sport and exercise psychologist, Family Supports Permission granted to share information::  Yes, Verbal Permission Granted             Name::     Landscape architect::  SNFs             Relationship::  sister             Contact Information:  [phone]  Housing/Transportation Living arrangements for the past 2 months:  Single Family Home Source of Information:  Patient Patient Interpreter Needed:  None Criminal Activity/Legal Involvement Pertinent to Current Situation/Hospitalization:  No - Comment as needed Significant Relationships:  Siblings Lives with:  Siblings Do you feel safe going back to the place where you live?  Yes Need for family participation in patient care:  Yes (Comment)  Care giving concerns: Patient from home with brother. PT recommending SNF.    Social Worker assessment / plan: CSW received call from MD that  patient now agreeable to SNF - previously refusing. CSW met with patient at bedside. Patient oriented, though with flat affect and slow responses. CSW discussed discharge planning. Patient agreeable to SNF. Patient indicated her POA is her sister, [name]. CSW left voicemail message for [facility], awaiting call back. CSW explained process of insurance authorization needed for SNF. CSW sent out initial referrals. CSW will follow up to provide bed offers. Patient will require [employer] authorization before admitting to facility.  Employment status:  Retired Astronomer) PT  Recommendations:  [place] / Referral to community resources:  [place]  Patient/Family's Response to care: Not discussed.  Patient/Family's Understanding of and Emotional Response to Diagnosis, Current Treatment, and Prognosis: Patient now agreeable to SNF, though did not express emotional response to treatment or discharge plan.  Emotional Assessment Appearance:  Appears stated age Attitude/Demeanor/Rapport:  Other(flat, slow to respond) Affect (typically observed):  Flat Orientation:  Oriented to Self, Oriented to Place, Oriented to  Time, Oriented to Situation Alcohol / Substance use:  Not Applicable Psych involvement (Current and /or in the community):  No (Comment)  Discharge Needs  Concerns to be addressed:  Care Coordination, Discharge Planning Concerns Readmission within the last 30 days:  No Current discharge risk:  Physical Impairment Barriers to Discharge:  Continued Medical Work up, [place], LCSW 02/23/2018, 10:33 AM

## 2018-02-27 NOTE — Evaluation (Signed)
Physical Therapy Evaluation Patient Details Name: Beth Alexander MRN: [id number] DOB: July 25, 1951 Today's Date: 02/27/2018   History of Present Illness  67 y.o. 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  Pt readmitted on 02/26/18 for Hypotension/tachycardia  Clinical Impression  Pt admitted with above diagnosis. Pt currently with functional limitations due to the deficits listed below (see PT Problem List). Pt will benefit from skilled PT to increase their independence and safety with mobility to allow discharge to the venue listed below.  Pt assisted OOB and able to ambulate only 5 feet to recliner.  Pt requiring min assist for mobility at this time.  Pt reports fatigue and generalized weakness as well as bil knee pain limiting her mobility.  Recommend SNF upon d/c.     Follow Up Recommendations SNF    Equipment Recommendations  Rolling walker with 5" wheels    Recommendations for Other Services       Precautions / Restrictions Precautions Precautions: Fall Precaution Comments: currently on 3L O2 [place], recently discharged home on 4L      Mobility  Bed Mobility Overal bed mobility: Needs Assistance Bed Mobility: Supine to Sit     Supine to sit: Min assist     General bed mobility comments: assist for trunk upright, slow mobility with increased effort  Transfers Overall transfer level: Needs assistance Equipment used: Rolling walker (2 wheeled) Transfers: Sit to/from Stand Sit to Stand: Min assist         General transfer comment: assist to rise and steady as well as control descent  Ambulation/Gait Ambulation/Gait assistance: Min  assist Ambulation Distance (Feet): 5 Feet Assistive device: Rolling walker (2 wheeled) Gait Pattern/deviations: Step-through pattern;Decreased stride length     General Gait Details: min assist for steadying, pt encouraged to ambulate very short distance to recliner, after seated rest break, pt reports too fatigued and knees painful so declined farther distance, SpO2 90% on 3L O2 [place]   Stairs            Wheelchair Mobility    Modified Rankin (Stroke Patients Only)       Balance Overall balance assessment: Needs assistance Sitting-balance support: No upper extremity supported;Feet supported Sitting balance-Leahy Scale: Fair     Standing balance support: Bilateral upper extremity supported Standing balance-Leahy Scale: Poor Standing balance comment: requires UE support and external assist                             Pertinent Vitals/Pain Pain Assessment: Faces Faces Pain Scale: Hurts little more Pain Location: bil knees Pain Descriptors / Indicators: Sore;Other (Comment)(pt rubbing) Pain Intervention(s): Limited activity within patient's tolerance;Repositioned;Monitored during session(pt reports premedication)    Home Living Family/patient expects to be discharged to:: Private residence Living Arrangements: Other relatives(sister) Available Help at Discharge: Family Type of Home: House Home Access: Stairs to enter Entrance Stairs-Rails: Right Entrance Stairs-Number of Steps: 3 Home Layout: One level Home Equipment: None Additional Comments: above from previous admission, pt with minimal verbalizations    Prior Function Level of Independence: Independent         Comments: independent prior to recent admission     Hand Dominance  Extremity/Trunk Assessment   Upper Extremity Assessment Upper Extremity Assessment: Generalized weakness    Lower Extremity Assessment Lower Extremity Assessment: Generalized weakness(reports knee pain,  rubs knees with activity)    Cervical / Trunk Assessment Cervical / Trunk Assessment: Kyphotic  Communication   Communication: No difficulties  Cognition Arousal/Alertness: Awake/alert Behavior During Therapy: Flat affect Overall Cognitive Status: Difficult to assess                                 General Comments: pt with minimal verbalizations, appears appropriate for answers she did provide; no family present      General Comments      Exercises     Assessment/Plan    PT Assessment Patient needs continued PT services  PT Problem List Decreased strength;Decreased balance;Decreased mobility;Decreased activity tolerance;Decreased knowledge of use of DME;Cardiopulmonary status limiting activity       PT Treatment Interventions DME instruction;Gait training;Functional mobility training;Therapeutic activities;Balance training;Patient/family education;Therapeutic exercise    PT Goals (Current goals can be found in the Care Plan section)  Acute Rehab PT Goals PT Goal Formulation: With patient Time For Goal Achievement: 03/13/18 Potential to Achieve Goals: Good    Frequency Min 3X/week   Barriers to discharge        Co-evaluation               AM-PAC PT "6 Clicks" Daily Activity  Outcome Measure Difficulty turning over in bed (including adjusting bedclothes, sheets and blankets)?: A Lot Difficulty moving from lying on back to sitting on the side of the bed? : A Lot Difficulty sitting down on and standing up from a chair with arms (e.g., wheelchair, bedside commode, etc,.)?: A Lot Help needed moving to and from a bed to chair (including a wheelchair)?: A Lot Help needed walking in hospital room?: A Lot Help needed climbing 3-5 steps with a railing? : Total 6 Click Score: 11    End of Session Equipment Utilized During Treatment: Gait belt;Oxygen Activity Tolerance: Patient limited by fatigue Patient left: in chair;with chair alarm set;with call  bell/phone within reach   PT Visit Diagnosis: Muscle weakness (generalized) (M62.81);Difficulty in walking, not elsewhere classified (R26.2)    Time: 1109-1130 PT Time Calculation (min) (ACUTE ONLY): 21 min   Charges:   PT Evaluation $PT Eval Moderate Complexity: 1 Mod     PT G Codes:        Carmelia Bake, PT, DPT 02/27/2018 Pager: [phone]  York Ram E 02/27/2018, 12:49 PM

## 2018-02-27 NOTE — Telephone Encounter (Signed)
"  [name], sister and POA calling to speak with Dr. Julien Nordmann or his assistant.  I'm sure they know Telicia is in the hospital.  I have questions, need to speak with one of them."  Declined Triage offer to convey questions to a provider for return call stating "No I need to speak with them."  Call transferred after notification of helpful data needed in event voicemail received.

## 2018-02-27 NOTE — ED Notes (Signed)
Admitting provider at bedside.

## 2018-02-27 NOTE — Progress Notes (Signed)
Initial Nutrition Assessment  INTERVENTION:   Continue Ensure Enlive po BID, each supplement provides 350 kcal and 20 grams of protein  NUTRITION DIAGNOSIS:   Increased nutrient needs related to cancer and cancer related treatments as evidenced by estimated needs.  GOAL:   Patient will meet greater than or equal to 90% of their needs  MONITOR:   PO intake, Supplement acceptance, Weight trends, Labs, I & O's, Skin  REASON FOR ASSESSMENT:   Malnutrition Screening Tool    ASSESSMENT:   67 y.o. 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 Last seen by nutrition team on 5/7. Pt has been eating 50-100% of meals. Ate 50% of breakfast this morning. Ensure supplements have been ordered (which is the intervention that was started during previous admission).   Per chart review, pt has lost 10 lb since 3/1 (7% wt loss x 2 months, significant for time frame).   Labs reviewed. Medications: MVI daily, Senokot tablet BID  NUTRITION - FOCUSED PHYSICAL EXAM:    Most Recent Value  Orbital Region  No depletion  Upper Arm Region  No depletion  Thoracic and Lumbar Region  Unable to assess  Buccal Region  No depletion  Temple Region  No depletion  Clavicle Bone Region  No depletion  Clavicle and Acromion Bone Region  No depletion  Scapular Bone Region  Unable to assess  Dorsal Hand  No depletion  Patellar Region  Mild depletion  Anterior Thigh Region  Moderate depletion  Posterior Calf Region  Moderate depletion  Edema (RD Assessment)  Severe      Diet Order:   Diet Order           Diet regular Room service appropriate? Yes; Fluid consistency: Thin  Diet effective now          EDUCATION NEEDS:   No education needs have been identified at this time  Skin:  Skin Integrity Issues:: Stage II Stage II:  sacrum  Last BM:  5/8  Height:   Ht Readings from Last 1 Encounters:  02/26/18 5\' 3"  (1.6 m)    Weight:   Wt Readings from Last 1 Encounters:  02/26/18 126 lb 5 oz (57.3 kg)    Ideal Body Weight:  52.27 kg  BMI:  Body mass index is 22.38 kg/m.  Estimated Nutritional Needs:   Kcal:  1900-2100  Protein:  85-95g  Fluid:  2L/day  Clayton Bibles, MS, RD, LDN [place] [facility] Pager: [phone] After Hours Pager: [phone]

## 2018-02-27 NOTE — Progress Notes (Signed)
[facility] PROGRESS NOTE    Progress Note  Beth Alexander  YWV:[id number] DOB: 1951-01-17 DOA: 02/26/2018 PCP: [name], MD     Brief Narrative:   Beth Alexander is an 67 y.o. 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 Unlikely to be sepsis, lactic acidosis normal, procalcitonin was low there is no source of infection. Morning cortisol is pending. Continue to hold antibiotics his blood pressure has responded to IV fluids he has remained afebrile with no leukocytosis. We will get physical therapy evaluation will probably need to go home with home health physical therapy and social worker.  Hypovolemic hyponatremia: Start on IV fluids, recheck basic metabolic panel in the morning. Blood pressure is improving slowly, tachycardia has resolved.  COPD: Continue oxygen at 4 L, continue inhalers.  Essential hypertension: Continue to hold antihypertensive medication.  Stage IV widely metastatic squamous cell carcinoma of right lung [facility]) Patient immunotherapy. We will have to discuss goals of care.  Depression: Continue sertraline. Brain metastasis ([place])  Protein calorie malnutrition ([place]) Ensure 3 times daily.  Acute DVT (deep venous thrombosis) (HCC) Continue Lovenox    DVT prophylaxis: lovexno Family Communication:none Disposition Plan/Barrier  to D/C: home in am Code Status:     Code Status Orders  (From admission, onward)        Start     Ordered   02/27/18 0342  Do not attempt resuscitation (DNR)  Continuous    Question Answer Comment  In the event of cardiac or respiratory ARREST Do not call a "code blue"   In the event of cardiac or respiratory ARREST Do not perform Intubation, CPR, defibrillation or ACLS   In the event of cardiac or respiratory ARREST Use medication by any route, position, wound care, and other measures to relive pain and suffering. May use oxygen, suction and manual treatment of airway obstruction as needed for comfort.      02/27/18 0341    Code Status History    Date Active Date Inactive Code Status Order ID Comments User Context   02/20/2018 1522 02/26/2018 1755 DNR [id number]  Debbe Odea, MD Inpatient   02/12/2018 2041 02/20/2018 1522 Full Code [id number]  Rise Patience, MD ED   12/10/2017 0254 12/12/2017 1702 Full Code [id number]  Ivor Costa, MD ED    Advance Directive Documentation     Most Recent Value  Type of Advance Directive  Healthcare Power of [place], Living will  Pre-existing out of facility DNR order (yellow form or pink MOST form)  -  "MOST" Form in Place?  -        IV Access:    Peripheral IV   Procedures and diagnostic studies:   Dg Chest 2 View  Result Date: 02/26/2018 CLINICAL DATA:  Sepsis, pneumonia, fatigue. EXAM: CHEST - 2 VIEW COMPARISON:  02/19/2018. FINDINGS: Stable exam, with BILATERAL pulmonary opacities, and RIGHT hilar mass. RIGHT hemidiaphragm elevation. No significant effusion or pneumothorax.  Unchanged cardiac silhouette. No acute osseous findings. IMPRESSION: Stable chest. Electronically Signed   By: Staci Righter M.D.   On: 02/26/2018 23:08     Medical Consultants:    None.  Anti-Infectives:   None  Subjective:    Beth Alexander she relates she feels less fatigue and with more strength.  She would like to work with physical  therapy.  Objective:    Vitals:   02/27/18 0030 02/27/18 0233 02/27/18 0341 02/27/18 0512  BP: 131/90 119/78 118/87 (!) 139/91  Pulse: 96 90 97 94  Resp: 19 12 14 14   Temp:   97.8 F (36.6 C) (!) 97.5 F (36.4 C)  TempSrc:   Oral Oral  SpO2: 99% 98% 98% 100%  Weight:      Height:        Intake/Output Summary (Last 24 hours) at 02/27/2018 0832 Last data filed at 02/27/2018 0442 Gross per 24 hour  Intake 81.67 ml  Output 300 ml  Net -218.33 ml   Filed Weights   02/26/18 1928  Weight: 57.3 kg (126 lb 5 oz)    Exam: General exam: In no acute distress. Respiratory system: Good air movement and clear to auscultation. Cardiovascular system: S1 & S2 heard, RRR.  Gastrointestinal system: Abdomen is nondistended, soft and nontender.  Central nervous system: Alert and oriented. No focal neurological deficits. Extremities: No pedal edema. Skin: No rashes, lesions or ulcers Psychiatry: Judgement and insight appear normal. Mood & affect appropriate.    Data Reviewed:    Labs: Basic Metabolic Panel: Recent Labs  Lab 02/25/18 0550 02/26/18 0512 02/26/18 2209  NA  --   --  133*  K  --   --  4.1  CL  --   --  94*  CO2  --   --  28  GLUCOSE  --   --  110*  BUN  --   --  18  CREATININE 0.38* 0.44 0.36*  CALCIUM  --   --  9.3   GFR Estimated Creatinine Clearance: 58 mL/min (A) (by C-G formula based on SCr of 0.36 mg/dL (L)). Liver Function Tests: Recent Labs  Lab 02/26/18 2209  AST 24  ALT 63*  ALKPHOS 68  BILITOT 0.5  PROT 6.6  ALBUMIN 3.0*   No results for input(s): LIPASE, AMYLASE in the last 168 hours. No results for input(s): AMMONIA in the last 168 hours. Coagulation profile No results for input(s): INR, PROTIME in the last 168 hours.  CBC: Recent Labs  Lab 02/22/18 0511 02/25/18 0550 02/26/18 2209  WBC 10.4 11.9* 12.7*  NEUTROABS  --   --  11.1*  HGB 9.8* 10.5* 10.8*  HCT 29.3* 31.6* 31.8*  MCV 92.4 92.7 90.9  PLT 500* 490* 457*   Cardiac  Enzymes: No results for input(s): CKTOTAL, CKMB, CKMBINDEX, TROPONINI in the last 168 hours. BNP (last 3 results) No results for input(s): PROBNP in the last 8760 hours. CBG: Recent Labs  Lab 02/25/18 1133 02/25/18 1639 02/25/18 2123 02/26/18 0753 02/26/18 1152  GLUCAP 145* 151* 149* 103* 206*   D-Dimer: No results for input(s): DDIMER in the last 72 hours. Hgb A1c: No results for input(s): HGBA1C in the last 72 hours. Lipid Profile: No results for input(s): CHOL, HDL, LDLCALC, TRIG, CHOLHDL, LDLDIRECT in the last 72 hours. Thyroid function studies: No results for input(s): TSH, T4TOTAL, T3FREE, THYROIDAB in the last 72 hours.  Invalid input(s): FREET3 Anemia work up: No results for input(s): VITAMINB12, FOLATE, FERRITIN, TIBC, IRON, RETICCTPCT  in the last 72 hours. Sepsis Labs: Recent Labs  Lab 02/21/18 0536 02/22/18 0511 02/25/18 0550 02/26/18 2209 02/26/18 2238 02/27/18 0115  PROCALCITON <0.10  --   --   --   --  <0.10  WBC  --  10.4 11.9* 12.7*  --   --   LATICACIDVEN  --   --   --   --  1.41  --    Microbiology Recent Results (from the past 240 hour(s))  Respiratory Panel by PCR     Status: None   Collection Time: 02/19/18  8:27 AM  Result Value Ref Range Status   Adenovirus NOT DETECTED NOT DETECTED Final   Coronavirus 229E NOT DETECTED NOT DETECTED Final   Coronavirus HKU1 NOT DETECTED NOT DETECTED Final   Coronavirus NL63 NOT DETECTED NOT DETECTED Final   Coronavirus OC43 NOT DETECTED NOT DETECTED Final   Metapneumovirus NOT DETECTED NOT DETECTED Final   Rhinovirus / Enterovirus NOT DETECTED NOT DETECTED Final   Influenza A NOT DETECTED NOT DETECTED Final   Influenza B NOT DETECTED NOT DETECTED Final   Parainfluenza Virus 1 NOT DETECTED NOT DETECTED Final   Parainfluenza Virus 2 NOT DETECTED NOT DETECTED Final   Parainfluenza Virus 3 NOT DETECTED NOT DETECTED Final   Parainfluenza Virus 4 NOT DETECTED NOT DETECTED Final   Respiratory Syncytial Virus  NOT DETECTED NOT DETECTED Final   Bordetella pertussis NOT DETECTED NOT DETECTED Final   Chlamydophila pneumoniae NOT DETECTED NOT DETECTED Final   Mycoplasma pneumoniae NOT DETECTED NOT DETECTED Final     Medications:   . arformoterol  15 mcg Nebulization BID  . docusate sodium  100 mg Oral BID  . enoxaparin  60 mg Subcutaneous Q12H  . feeding supplement (ENSURE ENLIVE)  237 mL Oral BID BM  . guaiFENesin  1,200 mg Oral BID  . mouth rinse  15 mL Mouth Rinse BID  . multivitamin with minerals  1 tablet Oral Daily  . senna  1 tablet Oral BID  . sertraline  50 mg Oral Daily   Continuous Infusions: . sodium chloride 100 mL/hr at 02/27/18 0353    LOS: 0 days   Charlynne Cousins  [facility] Pager [phone]  *Please refer to [email or web address], password [email or web address] to get updated schedule on who will round on this patient, as hospitalists switch teams weekly. If 7PM-7AM, please contact night-coverage at [email or web address], password [email or web address] for any overnight needs.  02/27/2018, 8:32 AM

## 2018-02-28 DIAGNOSIS — C3411 Malignant neoplasm of upper lobe, right bronchus or lung: Secondary | ICD-10-CM

## 2018-02-28 DIAGNOSIS — C7931 Secondary malignant neoplasm of brain: Secondary | ICD-10-CM

## 2018-02-28 LAB — CBC
HCT: 27.3 % — ABNORMAL LOW (ref 36.0–46.0)
Hemoglobin: 9.1 g/dL — ABNORMAL LOW (ref 12.0–15.0)
MCH: 31.2 pg (ref 26.0–34.0)
MCHC: 33.3 g/dL (ref 30.0–36.0)
MCV: 93.5 fL (ref 78.0–100.0)
Platelets: 332 10*3/uL (ref 150–400)
RBC: 2.92 MIL/uL — ABNORMAL LOW (ref 3.87–5.11)
RDW: 16.6 % — ABNORMAL HIGH (ref 11.5–15.5)
WBC: 8.3 10*3/uL (ref 4.0–10.5)

## 2018-02-28 LAB — BASIC METABOLIC PANEL
Anion gap: 10 (ref 5–15)
BUN: 11 mg/dL (ref 6–20)
CO2: 25 mmol/L (ref 22–32)
Calcium: 8.2 mg/dL — ABNORMAL LOW (ref 8.9–10.3)
Chloride: 100 mmol/L — ABNORMAL LOW (ref 101–111)
Glucose, Bld: 78 mg/dL (ref 65–99)
Potassium: 3.3 mmol/L — ABNORMAL LOW (ref 3.5–5.1)
SODIUM: 135 mmol/L (ref 135–145)

## 2018-02-28 LAB — URINE CULTURE: CULTURE: NO GROWTH

## 2018-02-28 NOTE — Discharge Summary (Signed)
Physician Discharge Summary  Beth Alexander WNI:[id number] DOB: [birth year] DOA: 02/26/2018  PCP: [name], MD  Admit date: 02/26/2018 Discharge date: 02/28/2018  Admitted From: home Disposition:  Home  Recommendations for Outpatient Follow-up:  1. Follow up with oncology in 1 week. 2. Healthcare power of attorney: Nani Skillern [phone] 6. Hospice and palliative care to follow-up as an outpatient.  Home Health:Yes Equipment/Devices:none  Discharge Condition:stable CODE STATUS:DNR Diet recommendation: Heart Healthy   Brief/Interim Summary: 67 y.o. 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, COPD DVT on Lovenox who was recently admitted and discharged on 02/26/2018 for pneumonia, due to insurance difficulties she was sent home with home health she relates that she has been feeling fatigue, she called EMS because her blood pressure was in the 80s she denies any abdominal pain nausea vomiting diarrhea syncope fever cough or shortness of breath ED she was found to be hypotensive in the 80s, with mild leukocytosis of 12 mild hyponatremia    Discharge Diagnoses:  Principal Problem:   Hypotension Active Problems:   Essential hypertension   Squamous cell carcinoma of right lung (HCC)   Brain metastasis (HCC)   Malignant neoplasm of bronchus of right upper lobe (HCC)   Protein calorie malnutrition ([place])   Acute DVT (deep venous thrombosis) ([place])   Hyponatremia  Hypotension/tachycardia: Likely due to hypovolemia due to decreased oral intake. Blood pressure improved significantly with IV fluids tachycardia resolved but is unlikely to be sepsis lactic acid was normal procalcitonin was low. Physical therapy evaluated the patient the recommended skilled nursing facility, but her insurance refused covering her skilled nursing facility stay.  Hypovolemic hyponatremia: Resolved with IV fluid hydration.  COPD: Stable continue 4 L of  home oxygen.  Essential hypertension: We have DC'd all of her  antihypertensive medication.  Stage IV widely metastatic squamous cell right carcinoma: Continue immunotherapy as an outpatient hospice and palliative care to meet with family as an outpatient.  Major depression: Stable no changes were made.  Severe protein caloric malnutrition:  Acute DVT: In the setting of malignancy continue Lovenox no changes made to her medication.   Discharge Instructions  Discharge Instructions    Diet - low sodium heart healthy   Complete by:  As directed    Increase activity slowly   Complete by:  As directed      Allergies as of 02/28/2018   No Known Allergies     Medication List    TAKE these medications   acetaminophen 500 MG tablet Commonly known as:  TYLENOL Take 500 mg by mouth every 6 (six) hours as needed for mild pain or headache.   acetaminophen 325 MG tablet Commonly known as:  TYLENOL Take 2 tablets (650 mg total) by mouth every 6 (six) hours as needed for mild pain (or Fever >/= 101).   arformoterol 15 MCG/2ML Nebu Commonly known as:  BROVANA Take 2 mLs (15 mcg total) by nebulization 2 (two) times daily.   benzonatate 100 MG capsule Commonly known as:  TESSALON Take 1 capsule (100 mg total) by mouth 3 (three) times daily as needed for cough.   dexamethasone 4 MG tablet Commonly known as:  DECADRON Take 1 tablet (4 mg total) by mouth 2 (two) times daily with a meal.   enoxaparin 60 MG/0.6ML injection Commonly known as:  LOVENOX Inject 0.6 mLs (60 mg total) into the skin every 12 (twelve) hours.   feeding supplement (ENSURE ENLIVE) Liqd Take 237  mLs by mouth 2 (two) times daily between meals.   guaiFENesin 600 MG 12 hr tablet Commonly known as:  MUCINEX Take 2 tablets (1,200 mg total) by mouth 2 (two) times daily.   multivitamin with minerals Tabs tablet Take 1 tablet by mouth daily.   sertraline 50 MG tablet Commonly known as:  ZOLOFT Take 1/2 tab  daily for 1 week and than full tab daily   sodium chloride 0.65 % Soln nasal spray Commonly known as:  OCEAN Place 1 spray into both nostrils as needed for congestion.       No Known Allergies  Consultations:  None   Procedures/Studies: Dg Chest 2 View  Result Date: 02/26/2018 CLINICAL DATA:  Sepsis, pneumonia, fatigue. EXAM: CHEST - 2 VIEW COMPARISON:  02/19/2018. FINDINGS: Stable exam, with BILATERAL pulmonary opacities, and RIGHT hilar mass. RIGHT hemidiaphragm elevation. No significant effusion or pneumothorax. Unchanged cardiac silhouette. No acute osseous findings. IMPRESSION: Stable chest. Electronically Signed   By: Staci Righter M.D.   On: 02/26/2018 23:08   Ct Head Wo Contrast  Result Date: 02/12/2018 CLINICAL DATA:  Altered level of consciousness, history of right thalamus brain metastasis, status post radiation EXAM: CT HEAD WITHOUT CONTRAST TECHNIQUE: Contiguous axial images were obtained from the base of the skull through the vertex without intravenous contrast. COMPARISON:  01/10/2018 FINDINGS: Brain: Stable 2 cm right thalamus hypodense lesion correlating with the known metastasis status post radiation. No acute intracranial hemorrhage, new infarction, midline shift, herniation, hydrocephalus, extra-axial fluid collection. No focal mass effect or edema. Cisterns are patent. No cerebellar abnormality. Vascular: No hyperdense vessel or unexpected calcification. Skull: Normal. Negative for fracture or focal lesion. Sinuses/Orbits: No acute finding. Other: None. IMPRESSION: Stable 2 cm hypodense right thalamus lesion. No acute intracranial abnormality by noncontrast CT. Electronically Signed   By: Jerilynn Mages.  Shick M.D.   On: 02/12/2018 21:14   Ct Angio Chest Pe W Or Wo Contrast  Result Date: 02/14/2018 CLINICAL DATA:  Shortness of breath.  Metastatic lung cancer. EXAM: CT ANGIOGRAPHY CHEST WITH CONTRAST TECHNIQUE: Multidetector CT imaging of the chest was performed using the standard  protocol during bolus administration of intravenous contrast. Multiplanar CT image reconstructions and MIPs were obtained to evaluate the vascular anatomy. 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 No discrete pulmonary emboli. Aortic atherosclerosis. Mediastinum/Nodes: Right hilar mass extending into the mediastinum is significantly smaller than on the prior study. Right upper lobe bronchus is now patent and the right upper lobe has reinflated. Lungs/Pleura: Extensive infiltrate throughout the left lung, most extensive in left upper lobe. Extensive emphysematous changes in the left lung. Pleural based soft tissue mass posteriorly at the right lung base best seen on image 60 of series 4 consistent with metastatic disease. This is slightly smaller than on the prior study. Upper Abdomen: Metastatic lesion in the upper pole of the right kidney. Nodularity of the right adrenal gland probably represents metastatic disease. Musculoskeletal: No chest wall abnormality. No acute or significant osseous findings. Review of the MIP images confirms the above findings. IMPRESSION: 1. No acute pulmonary emboli. 2. 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 3. Decreased size of right hilar mass with new patency of the right upper lobe bronchus with reinflation of the right upper lobe. 4. Extensive infiltrates  throughout the left lung, most prominent in the right upper lobe. 5. Aortic Atherosclerosis (ICD10-I70.0) and Emphysema (ICD10-J43.9). 6. Metastatic disease to the right kidney, right adrenal glands, and to the posterior right chest wall at the right lung base. Electronically Signed   By: Lorriane Shire M.D.   On: 02/14/2018 13:21   Dg Chest  Port 1 View  Result Date: 02/19/2018 CLINICAL DATA:  Shortness of breath. History of lung malignancy, sepsis, former smoker. EXAM: PORTABLE CHEST 1 VIEW COMPARISON:  Chest x-ray of February 18, 2018 FINDINGS: The right hemidiaphragm is higher than the left and the right lung is hypoinflated. There is persistent right hilar density consistent with a known mass. The interstitial markings of the right lung are coarse. On the left diffusely increased lung markings are present. There is no confluent infiltrate. There is no pleural effusion. The heart and pulmonary vascularity are normal. The mediastinum is normal in width. IMPRESSION: Slight increase conspicuity of interstitial markings in the right lung accentuated by elevation of the hemidiaphragm. Persistent diffusely increased lung markings on the left. Overall there has not been dramatic interval change. Electronically Signed   By: David  Martinique M.D.   On: 02/19/2018 09:50   Dg Chest Port 1 View  Result Date: 02/18/2018 CLINICAL DATA:  67 year old female with a history of shortness of breath EXAM: PORTABLE CHEST 1 VIEW COMPARISON:  02/17/2018, 02/16/2018, CT 02/14/2018, CT 12/10/2017 FINDINGS: Cardiomediastinal silhouette unchanged in size and contour. Opacity in the right hilar region, similar to the comparison. Similar appearance of reticulonodular opacity of the left lung. No large pleural effusion. Low lung volumes. No displaced fracture. Right posterior chest wall mass with destructive bony changes is better visualized on prior chest CT. IMPRESSION: Similar appearance of the chest x-ray, with reticulonodular opacity throughout the left lung, similar to prior. Differential includes edema, and/or infection, or potentially post treatment changes. Opacity in the right hilar region corresponds to mass on prior CT images. Electronically Signed   By: Corrie Mckusick D.O.   On: 02/18/2018 09:44   Dg Chest Port 1 View  Result Date: 02/17/2018 CLINICAL DATA:   Shortness of breath. EXAM: PORTABLE CHEST 1 VIEW COMPARISON:  Radiograph of February 16, 2018. FINDINGS: The heart size and mediastinal contours are within normal limits. Atherosclerosis of thoracic aorta is noted. No pneumothorax or pleural effusion is noted. Stable mild diffuse interstitial density seen in left lung consistent with inflammation or asymmetric edema. Stable minimal right basilar opacity is noted. The visualized skeletal structures are unremarkable. IMPRESSION: Stable mild diffuse interstitial left lung opacity is noted consistent with inflammation or asymmetric edema. Stable minimal right basilar opacity. Aortic Atherosclerosis (ICD10-I70.0). Electronically Signed   By: Marijo Conception, M.D.   On: 02/17/2018 07:21   Dg Chest Port 1 View  Result Date: 02/16/2018 CLINICAL DATA:  Lung cancer.  Brain metastases. EXAM: PORTABLE CHEST 1 VIEW COMPARISON:  February 14, 2018 FINDINGS: The cardiomediastinal silhouette is stable. No pneumothorax. Diffuse left infiltrate, similar to slightly improved in the interval. Patchy infiltrate in the right lung is stable. IMPRESSION: Minimal improvement of the diffuse left infiltrate. Mild patchy opacity in the right lung is stable. No other changes. Electronically Signed   By: Dorise Bullion III M.D   On: 02/16/2018 07:46   Dg Chest Port 1 View  Result Date: 02/14/2018 CLINICAL DATA:  Shortness of breath. EXAM: PORTABLE CHEST 1 VIEW COMPARISON:  Radiograph of February 12, 2018. FINDINGS: Stable cardiomediastinal silhouette. No pneumothorax is noted. Right lung is  clear. Increased diffuse left lung opacity is noted concerning for worsening pneumonia or less likely asymmetric edema. No significant pleural effusion is noted. Bony thorax is unremarkable. IMPRESSION: Increased diffuse left lung opacity is noted concerning for worsening pneumonia or less likely asymmetric edema. Electronically Signed   By: Marijo Conception, M.D.   On: 02/14/2018 08:52   Dg Chest Port 1  View  Result Date: 02/12/2018 CLINICAL DATA:  Increasing shortness of breath. Low oxygen saturation. EXAM: PORTABLE CHEST 1 VIEW COMPARISON:  02/12/2018 FINDINGS: Shallow inspiration with elevation of the right hemidiaphragm. Mild linear atelectasis in the lung bases. There is asymmetrical interstitial infiltration throughout the left lung. This could represent asymmetrical edema or interstitial pneumonia. Similar appearance to previous study. No pleural effusions. No pneumothorax. Heart size and pulmonary vascularity are normal. IMPRESSION: Asymmetric interstitial pattern throughout the left lung likely represents interstitial pneumonia or asymmetrical edema. No change. Electronically Signed   By: Lucienne Capers M.D.   On: 02/12/2018 22:14   Dg Chest Port 1 View  Result Date: 02/12/2018 CLINICAL DATA:  Weakness, shortness of Breath EXAM: PORTABLE CHEST 1 VIEW COMPARISON:  12/11/2017 FINDINGS: Diffuse interstitial prominence throughout the left lung. Right lung is clear. Heart is normal size. No effusions. No acute bony abnormality. IMPRESSION: Diffuse interstitial prominence throughout the left lung, likely pneumonia. Electronically Signed   By: Rolm Baptise M.D.   On: 02/12/2018 17:42   US Abdomen Limited Ruq  Result Date: 02/14/2018 CLINICAL DATA:  Abnormal LFTs EXAM: ULTRASOUND ABDOMEN LIMITED RIGHT UPPER QUADRANT COMPARISON:  None. FINDINGS: Gallbladder: Gallbladder is well distended. Mild gallbladder sludge is noted within. No gallstones are seen. No wall thickening is noted. Common bile duct: Diameter: 1.9 mm Liver: Mild increased echogenicity is identified consistent with fatty infiltration. No focal mass is seen. Portal vein is patent on color Doppler imaging with normal direction of blood flow towards the liver. IMPRESSION: Gallbladder sludge. Fatty liver. Electronically Signed   By: Inez Catalina M.D.   On: 02/14/2018 10:38      Subjective: No complains.  Discharge Exam: Vitals:    02/28/18 0533 02/28/18 0745  BP: (!) 83/64   Pulse: (!) 103   Resp: 15   Temp: 97.9 F (36.6 C)   SpO2: 95% 95%   Vitals:   02/27/18 2112 02/27/18 2127 02/28/18 0533 02/28/18 0745  BP:  90/60 (!) 83/64   Pulse:  96 (!) 103   Resp:  14 15   Temp:  (!) 97.4 F (36.3 C) 97.9 F (36.6 C)   TempSrc:  Oral Oral   SpO2: 99% 98% 95% 95%  Weight:      Height:        General: Pt is alert, awake, not in acute distress Cardiovascular: RRR, S1/S2 +, no rubs, no gallops Respiratory: CTA bilaterally, no wheezing, no rhonchi Abdominal: Soft, NT, ND, bowel sounds + Extremities: no edema, no cyanosis    The results of significant diagnostics from this hospitalization (including imaging, microbiology, ancillary and laboratory) are listed below for reference.     Microbiology: Recent Results (from the past 240 hour(s))  Respiratory Panel by PCR     Status: None   Collection Time: 02/19/18  8:27 AM  Result Value Ref Range Status   Adenovirus NOT DETECTED NOT DETECTED Final   Coronavirus 229E NOT DETECTED NOT DETECTED Final   Coronavirus HKU1 NOT DETECTED NOT DETECTED Final   Coronavirus NL63 NOT DETECTED NOT DETECTED Final   Coronavirus OC43 NOT DETECTED NOT DETECTED  Final   Metapneumovirus NOT DETECTED NOT DETECTED Final   Rhinovirus / Enterovirus NOT DETECTED NOT DETECTED Final   Influenza A NOT DETECTED NOT DETECTED Final   Influenza B NOT DETECTED NOT DETECTED Final   Parainfluenza Virus 1 NOT DETECTED NOT DETECTED Final   Parainfluenza Virus 2 NOT DETECTED NOT DETECTED Final   Parainfluenza Virus 3 NOT DETECTED NOT DETECTED Final   Parainfluenza Virus 4 NOT DETECTED NOT DETECTED Final   Respiratory Syncytial Virus NOT DETECTED NOT DETECTED Final   Bordetella pertussis NOT DETECTED NOT DETECTED Final   Chlamydophila pneumoniae NOT DETECTED NOT DETECTED Final   Mycoplasma pneumoniae NOT DETECTED NOT DETECTED Final  Blood culture (routine x 2)     Status: None (Preliminary result)    Collection Time: 02/26/18 10:09 PM  Result Value Ref Range Status   Specimen Description   Final    BLOOD BLOOD LEFT FOREARM Performed at [facility] [address] [place], [place] [address]    Special Requests   Final    BOTTLES DRAWN AEROBIC AND ANAEROBIC Blood Culture adequate volume Performed at [facility] [address], [place], [place] [address]    Culture   Final    NO GROWTH < 24 HOURS Performed at [facility] [address] [place], [place] [address]    Report Status PENDING  Incomplete  Blood culture (routine x 2)     Status: None (Preliminary result)   Collection Time: 02/26/18 10:30 PM  Result Value Ref Range Status   Specimen Description   Final    BLOOD BLOOD RIGHT FOREARM Performed at [facility] [address], [place], [place] [address]    Special Requests   Final    BOTTLES DRAWN AEROBIC AND ANAEROBIC Blood Culture results may not be optimal due to an excessive volume of blood received in culture bottles Performed at [facility] [address] [place], [place] [address]    Culture   Final    NO GROWTH < 24 HOURS Performed at [facility] [address] [place], [place] [address]    Report Status PENDING  Incomplete  Urine culture     Status: None   Collection Time: 02/27/18 12:19 AM  Result Value Ref Range Status   Specimen Description   Final    URINE, CATHETERIZED Performed at [facility] [address] [place], [place] [address]    Special Requests   Final    NONE Performed at [facility], [place] [address], [place],  [address]    Culture   Final    NO GROWTH Performed at [facility], [place] [address], [place],  [address]    Report Status 02/28/2018 FINAL  Final     Labs: BNP (last 3 results) Recent Labs    02/12/18 2358  BNP 85.0   Basic Metabolic Panel: Recent Labs  Lab  02/25/18 0550 02/26/18 0512 02/26/18 2209 02/27/18 0801 02/28/18 0357  NA  --   --  133* 135 135  K  --   --  4.1 3.6 3.3*  CL  --   --  94* 98* 100*  CO2  --   --  28 27 25   GLUCOSE  --   --  110* 80 78  BUN  --   --  18 16 11   CREATININE 0.38* 0.44 0.36* 0.32* <0.30*  CALCIUM  --   --  9.3 8.8* 8.2*   Liver Function Tests: Recent Labs  Lab 02/26/18 2209  AST 24  ALT 63*  ALKPHOS 68  BILITOT 0.5  PROT 6.6  ALBUMIN 3.0*   No results for input(s): LIPASE, AMYLASE in the last 168 hours. No results for input(s): AMMONIA in the last 168 hours. CBC: Recent Labs  Lab 02/22/18 0511 02/25/18 0550 02/26/18 2209 02/28/18 0639  WBC 10.4 11.9* 12.7* 8.3  NEUTROABS  --   --  11.1*  --   HGB 9.8* 10.5* 10.8* 9.1*  HCT 29.3* 31.6* 31.8* 27.3*  MCV 92.4 92.7 90.9 93.5  PLT 500* 490* 457* 332   Cardiac Enzymes: No results for input(s): CKTOTAL, CKMB, CKMBINDEX, TROPONINI in the last 168 hours. BNP: Invalid input(s): POCBNP CBG: Recent Labs  Lab 02/25/18 1133 02/25/18 1639 02/25/18 2123 02/26/18 0753 02/26/18 1152  GLUCAP 145* 151* 149* 103* 206*   D-Dimer No results for input(s): DDIMER in the last 72 hours. Hgb A1c No results for input(s): HGBA1C in the last 72 hours. Lipid Profile No results for input(s): CHOL, HDL, LDLCALC, TRIG, CHOLHDL, LDLDIRECT in the last 72 hours. Thyroid function studies No results for input(s): TSH, T4TOTAL, T3FREE, THYROIDAB in the last 72 hours.  Invalid input(s): FREET3 Anemia work up No results for input(s): VITAMINB12, FOLATE, FERRITIN, TIBC, IRON, RETICCTPCT in the last 72 hours. Urinalysis    Component Value Date/Time   COLORURINE YELLOW 02/27/2018 0019   APPEARANCEUR HAZY (A) 02/27/2018 0019   LABSPEC 1.025 02/27/2018 0019   PHURINE 5.0 02/27/2018 0019   GLUCOSEU 150 (A) 02/27/2018 0019   HGBUR NEGATIVE 02/27/2018 0019   BILIRUBINUR NEGATIVE 02/27/2018 0019   KETONESUR 5 (A) 02/27/2018 0019   PROTEINUR NEGATIVE  02/27/2018 0019   NITRITE NEGATIVE 02/27/2018 0019   LEUKOCYTESUR NEGATIVE 02/27/2018 0019   Sepsis Labs Invalid input(s): PROCALCITONIN,  WBC,  LACTICIDVEN Microbiology Recent Results (from the past 240 hour(s))  Respiratory Panel by PCR     Status: None   Collection Time: 02/19/18  8:27 AM  Result Value Ref Range Status   Adenovirus NOT DETECTED NOT DETECTED Final   Coronavirus 229E NOT DETECTED NOT DETECTED Final   Coronavirus HKU1 NOT DETECTED NOT DETECTED Final   Coronavirus NL63 NOT DETECTED NOT DETECTED Final   Coronavirus OC43 NOT DETECTED NOT DETECTED Final   Metapneumovirus NOT DETECTED NOT DETECTED Final   Rhinovirus / Enterovirus NOT DETECTED NOT DETECTED Final   Influenza A NOT DETECTED NOT DETECTED Final   Influenza B NOT DETECTED NOT DETECTED Final   Parainfluenza Virus 1 NOT DETECTED NOT DETECTED Final   Parainfluenza Virus 2 NOT DETECTED NOT DETECTED Final   Parainfluenza Virus 3 NOT DETECTED NOT DETECTED Final   Parainfluenza Virus 4 NOT DETECTED NOT DETECTED Final   Respiratory Syncytial Virus NOT DETECTED NOT DETECTED Final   Bordetella pertussis NOT DETECTED NOT DETECTED Final   Chlamydophila pneumoniae NOT DETECTED NOT DETECTED Final   Mycoplasma pneumoniae NOT DETECTED NOT DETECTED Final  Blood culture (routine x 2)     Status: None (Preliminary result)   Collection Time: 02/26/18 10:09 PM  Result Value Ref Range Status   Specimen Description   Final    BLOOD BLOOD LEFT FOREARM Performed at [facility], [place] [address] [place], [place] [address]    Special Requests   Final    BOTTLES DRAWN AEROBIC AND ANAEROBIC Blood Culture adequate volume Performed at [facility] [address] [place], [place] [address]  Culture   Final    NO GROWTH < 24 HOURS Performed at [facility], [place] [address], [place], [place] [address]    Report Status PENDING  Incomplete  Blood culture (routine x 2)     Status: None  (Preliminary result)   Collection Time: 02/26/18 10:30 PM  Result Value Ref Range Status   Specimen Description   Final    BLOOD BLOOD RIGHT FOREARM Performed at [facility] [address] [place], [place] [address]    Special Requests   Final    BOTTLES DRAWN AEROBIC AND ANAEROBIC Blood Culture results may not be optimal due to an excessive volume of blood received in culture bottles Performed at [facility] [address], [place], [place] [address]    Culture   Final    NO GROWTH < 24 HOURS Performed at [facility] [address], [place], [place] [address]    Report Status PENDING  Incomplete  Urine culture     Status: None   Collection Time: 02/27/18 12:19 AM  Result Value Ref Range Status   Specimen Description   Final    URINE, CATHETERIZED Performed at [facility] [address], [place], [place] [address]    Special Requests   Final    NONE Performed at [facility], [place] [address] [place], [place] [address]    Culture   Final    NO GROWTH Performed at [facility], [place] [address], [place], [place] [address]    Report Status 02/28/2018 FINAL  Final     Time coordinating discharge: Over 33 minutes  SIGNED:   Charlynne Cousins, MD  [facility] 02/28/2018, 9:45 AM Pager   If 7PM-7AM, please contact night-coverage [email or web address] Password [email or web address]

## 2018-02-28 NOTE — Care Management Note (Signed)
Case Management Note  Patient Details  Name: Beth Alexander MRN: [id number] Date of Birth: 22-Aug-1951  Pt was active with [facility], [facility] and [facility] prior to admission. Pt also was receiving home O2. Pt to dc back home with same services. MD orders received and Medical Necessity form filled out for [facility] to be called by nursing staff.  Lynnell Catalan, RN 02/28/2018, 9:42 AM  [phone]

## 2018-03-01 LAB — CORTISOL: Cortisol, Plasma: 15.3 ug/dL

## 2018-03-03 LAB — CULTURE, BLOOD (ROUTINE X 2)
CULTURE: NO GROWTH
Culture: NO GROWTH
SPECIAL REQUESTS: ADEQUATE

## 2018-03-03 NOTE — Telephone Encounter (Signed)
Prognosis without treatment is less than 6 months. Yes I will sign the home health care order if needed.

## 2018-03-04 ENCOUNTER — Encounter: Payer: Self-pay | Admitting: Radiation Oncology

## 2018-03-04 ENCOUNTER — Ambulatory Visit
Admission: RE | Admit: 2018-03-04 | Discharge: 2018-03-04 | Disposition: A | Discharge status: Home / Self Care | Payer: Medicare HMO | Source: Ambulatory Visit | Attending: Radiation Oncology | Admitting: Radiation Oncology

## 2018-03-04 ENCOUNTER — Telehealth: Payer: Self-pay | Admitting: Medical Oncology

## 2018-03-04 ENCOUNTER — Other Ambulatory Visit: Payer: Self-pay

## 2018-03-04 ENCOUNTER — Inpatient Hospital Stay: Payer: Medicare HMO

## 2018-03-04 DIAGNOSIS — Z9981 Dependence on supplemental oxygen: Secondary | ICD-10-CM | POA: Diagnosis not present

## 2018-03-04 DIAGNOSIS — K760 Fatty (change of) liver, not elsewhere classified: Secondary | ICD-10-CM | POA: Diagnosis not present

## 2018-03-04 DIAGNOSIS — C3491 Malignant neoplasm of unspecified part of right bronchus or lung: Secondary | ICD-10-CM

## 2018-03-04 DIAGNOSIS — J449 Chronic obstructive pulmonary disease, unspecified: Secondary | ICD-10-CM | POA: Diagnosis not present

## 2018-03-04 DIAGNOSIS — J9819 Other pulmonary collapse: Secondary | ICD-10-CM | POA: Diagnosis not present

## 2018-03-04 DIAGNOSIS — C3401 Malignant neoplasm of right main bronchus: Secondary | ICD-10-CM | POA: Diagnosis not present

## 2018-03-04 DIAGNOSIS — C771 Secondary and unspecified malignant neoplasm of intrathoracic lymph nodes: Secondary | ICD-10-CM | POA: Insufficient documentation

## 2018-03-04 DIAGNOSIS — M6281 Muscle weakness (generalized): Secondary | ICD-10-CM | POA: Diagnosis not present

## 2018-03-04 DIAGNOSIS — Z5112 Encounter for antineoplastic immunotherapy: Secondary | ICD-10-CM | POA: Diagnosis not present

## 2018-03-04 DIAGNOSIS — K828 Other specified diseases of gallbladder: Secondary | ICD-10-CM | POA: Insufficient documentation

## 2018-03-04 DIAGNOSIS — I82409 Acute embolism and thrombosis of unspecified deep veins of unspecified lower extremity: Secondary | ICD-10-CM | POA: Diagnosis not present

## 2018-03-04 DIAGNOSIS — Z923 Personal history of irradiation: Secondary | ICD-10-CM | POA: Diagnosis not present

## 2018-03-04 DIAGNOSIS — R42 Dizziness and giddiness: Secondary | ICD-10-CM

## 2018-03-04 DIAGNOSIS — R634 Abnormal weight loss: Secondary | ICD-10-CM | POA: Diagnosis not present

## 2018-03-04 DIAGNOSIS — E860 Dehydration: Secondary | ICD-10-CM | POA: Diagnosis not present

## 2018-03-04 DIAGNOSIS — C7901 Secondary malignant neoplasm of right kidney and renal pelvis: Secondary | ICD-10-CM | POA: Insufficient documentation

## 2018-03-04 DIAGNOSIS — C7931 Secondary malignant neoplasm of brain: Secondary | ICD-10-CM | POA: Insufficient documentation

## 2018-03-04 DIAGNOSIS — I700 Atherosclerosis of aorta: Secondary | ICD-10-CM | POA: Diagnosis not present

## 2018-03-04 DIAGNOSIS — Z8701 Personal history of pneumonia (recurrent): Secondary | ICD-10-CM | POA: Diagnosis not present

## 2018-03-04 DIAGNOSIS — Z5111 Encounter for antineoplastic chemotherapy: Secondary | ICD-10-CM | POA: Diagnosis present

## 2018-03-04 DIAGNOSIS — R630 Anorexia: Secondary | ICD-10-CM | POA: Diagnosis not present

## 2018-03-04 DIAGNOSIS — C3411 Malignant neoplasm of upper lobe, right bronchus or lung: Secondary | ICD-10-CM | POA: Diagnosis present

## 2018-03-04 DIAGNOSIS — Z79899 Other long term (current) drug therapy: Secondary | ICD-10-CM | POA: Diagnosis not present

## 2018-03-04 DIAGNOSIS — R5383 Other fatigue: Secondary | ICD-10-CM | POA: Diagnosis not present

## 2018-03-04 DIAGNOSIS — Z7901 Long term (current) use of anticoagulants: Secondary | ICD-10-CM | POA: Diagnosis not present

## 2018-03-04 DIAGNOSIS — R000 Tachycardia, unspecified: Secondary | ICD-10-CM | POA: Diagnosis not present

## 2018-03-04 DIAGNOSIS — Z87891 Personal history of nicotine dependence: Secondary | ICD-10-CM | POA: Insufficient documentation

## 2018-03-04 DIAGNOSIS — E46 Unspecified protein-calorie malnutrition: Secondary | ICD-10-CM | POA: Diagnosis not present

## 2018-03-04 DIAGNOSIS — C7900 Secondary malignant neoplasm of unspecified kidney and renal pelvis: Secondary | ICD-10-CM | POA: Diagnosis not present

## 2018-03-04 LAB — TSH: TSH: 1.387 u[IU]/mL (ref 0.308–3.960)

## 2018-03-04 LAB — CBC WITH DIFFERENTIAL (CANCER CENTER ONLY)
BASOS ABS: 0 10*3/uL (ref 0.0–0.1)
BASOS PCT: 0 %
Eosinophils Absolute: 0.1 10*3/uL (ref 0.0–0.5)
Eosinophils Relative: 1 %
HEMATOCRIT: 26.9 % — AB (ref 34.8–46.6)
HEMOGLOBIN: 9.3 g/dL — AB (ref 11.6–15.9)
Lymphocytes Relative: 6 %
Lymphs Abs: 0.5 10*3/uL — ABNORMAL LOW (ref 0.9–3.3)
MCH: 32.1 pg (ref 25.1–34.0)
MCHC: 34.4 g/dL (ref 31.5–36.0)
MCV: 93.3 fL (ref 79.5–101.0)
MONO ABS: 0.6 10*3/uL (ref 0.1–0.9)
Monocytes Relative: 7 %
NEUTROS ABS: 7 10*3/uL — AB (ref 1.5–6.5)
NEUTROS PCT: 86 %
Platelet Count: 295 10*3/uL (ref 145–400)
RBC: 2.88 MIL/uL — AB (ref 3.70–5.45)
RDW: 17.5 % — AB (ref 11.2–14.5)
WBC Count: 8.1 10*3/uL (ref 3.9–10.3)

## 2018-03-04 LAB — CMP (CANCER CENTER ONLY)
ALK PHOS: 60 U/L (ref 40–150)
ALT: 42 U/L (ref 0–55)
ANION GAP: 9 (ref 3–11)
AST: 20 U/L (ref 5–34)
Albumin: 2.8 g/dL — ABNORMAL LOW (ref 3.5–5.0)
BILIRUBIN TOTAL: 0.2 mg/dL (ref 0.2–1.2)
BUN: 14 mg/dL (ref 7–26)
CALCIUM: 9.9 mg/dL (ref 8.4–10.4)
CO2: 30 mmol/L — ABNORMAL HIGH (ref 22–29)
Chloride: 102 mmol/L (ref 98–109)
Creatinine: 0.52 mg/dL — ABNORMAL LOW (ref 0.60–1.10)
GFR, Estimated: >60 mL/min (ref >60)
Glucose, Bld: 112 mg/dL (ref 70–140)
Potassium: 3.6 mmol/L (ref 3.5–5.1)
SODIUM: 141 mmol/L (ref 136–145)
TOTAL PROTEIN: 6.6 g/dL (ref 6.4–8.3)

## 2018-03-04 MED ORDER — DEXAMETHASONE 4 MG PO TABS: 4.0000 mg | ORAL_TABLET | Freq: Two times a day (BID) | ORAL | 0 refills | Status: DC

## 2018-03-04 NOTE — Telephone Encounter (Signed)
[place] notified.

## 2018-03-04 NOTE — Telephone Encounter (Signed)
I called [name] and left message . She can pick up elevated toilet seat at [facility] store or it can be shipped. I gave her the number . I instructed her to use an empty milk jug for needles.

## 2018-03-04 NOTE — Telephone Encounter (Signed)
-----   Message from Edwinna Areola sent at 03/04/2018  2:51 PM EDT ----- Regarding: RE: DME I don't know anything about [place] providing a sharps container. Does she want a Bedside commode that fits over the toilet or an elevated toilet seat? The elevated toilet seats are about $40 at our retail stores but the bedside commode can either be shipped to her or family can pick up at the store if they need it sooner. ----- Message ----- From: Ardeen Garland, RN Sent: 03/04/2018   1:36 PM To: Edwinna Areola Subject: DME                                            Needs sharpie container and over the toilet seat. Order placed in Epic and it printed.

## 2018-03-04 NOTE — Progress Notes (Signed)
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  12/29/17-01/06/18:  30 Gy in 10 fractions to the right upper lobe  Narrative:  The patient returns today for routine follow-up. During treatment she did very well with radiotherapy and did not have significant desquamation. Following treatment she was diagnosed with pneumonia and was sent home but was found to be hypotensive and with mild leukocytosis about a week and a half ago. She has only completed 1 course of chemotherapy on 01/29/18. She was discharged home with home health care but there were notes indicated possible hospice enrollment. The patient reports this is not her intention, and she wants to proceed with systemic therapy resuming on 03/12/18. Of note she has remained on Dexamethasone 4 mg BID since her treatment but ran out of her medications two days ago.              On review of systems, the patient states she is doing okay. She is using home O2 continuously at 2 liters. She denies any chest pain, shortness of breath, fevers, or chills. No other complaints are noted. She does note tiredness but is not having any headaches, visual or auditory changes. No other complaints are noted.  ALLERGIES:  has No Known Allergies.  Meds: Current Outpatient Medications  Medication Sig Dispense Refill  . acetaminophen (TYLENOL) 325 MG tablet Take 2 tablets (650 mg total) by mouth every 6 (six) hours as needed for mild pain (or Fever >/= 101).    Marland Kitchen arformoterol  (BROVANA) 15 MCG/2ML NEBU Take 2 mLs (15 mcg total) by nebulization 2 (two) times daily. 120 mL 0  . benzonatate (TESSALON) 100 MG capsule Take 1 capsule (100 mg total) by mouth 3 (three) times daily as needed for cough. 21 capsule 0  . dexamethasone (DECADRON) 4 MG tablet Take 1 tablet (4 mg total) by mouth 2 (two) times daily with a meal. 60 tablet 0  . enoxaparin (LOVENOX) 60 MG/0.6ML injection Inject 0.6 mLs (60 mg total) into the skin every 12 (twelve) hours. 60 Syringe 0  . feeding supplement, ENSURE ENLIVE, (ENSURE ENLIVE) LIQD Take 237 mLs by mouth 2 (two) times daily between meals. 237 mL 12  . guaiFENesin (MUCINEX) 600 MG 12 hr tablet Take 2 tablets (1,200 mg total) by mouth 2 (two) times daily. 40 tablet 0  . Multiple Vitamin (MULTIVITAMIN WITH MINERALS) TABS tablet Take 1 tablet by mouth daily. 30 tablet 0  . sertraline (ZOLOFT) 50 MG tablet Take 1/2 tab daily for 1 week and than full tab daily 30 tablet 0  . sodium chloride (OCEAN) 0.65 % SOLN nasal spray Place 1 spray into both nostrils as needed for congestion. 1 Bottle 0   No current facility-administered medications for this encounter.     Physical Findings:  weight is 116 lb 3.2 oz (52.7 kg). Her oral temperature is 98 F (36.7 C). Her blood pressure is 112/81 and her pulse is 110 (abnormal). Her respiration is 24 (abnormal) and oxygen  saturation is 100%.  Pain Assessment Pain Score: 0-No pain/10 In general this is a chronically ill appearing African American female in no acute distress. She's alert and oriented x4 and appropriate throughout the examination. Cardiopulmonary assessment is negative for acute distress and she exhibits normal effort, chest is clear to auscultation bilaterally. She has tachycardic rate but normal rhythm. She does have moon facies at this time.  Lab Findings: Lab Results  Component Value Date   WBC 8.3 02/28/2018   HGB 9.1 (L) 02/28/2018   HCT 27.3 (L) 02/28/2018   MCV 93.5 02/28/2018   PLT 332  02/28/2018     Radiographic Findings: Dg Chest 2 View  Result Date: 02/26/2018 CLINICAL DATA:  Sepsis, pneumonia, fatigue. EXAM: CHEST - 2 VIEW COMPARISON:  02/19/2018. FINDINGS: Stable exam, with BILATERAL pulmonary opacities, and RIGHT hilar mass. RIGHT hemidiaphragm elevation. No significant effusion or pneumothorax. Unchanged cardiac silhouette. No acute osseous findings. IMPRESSION: Stable chest. Electronically Signed   By: Staci Righter M.D.   On: 02/26/2018 23:08   Ct Head Wo Contrast  Result Date: 02/12/2018 CLINICAL DATA:  Altered level of consciousness, history of right thalamus brain metastasis, status post radiation EXAM: CT HEAD WITHOUT CONTRAST TECHNIQUE: Contiguous axial images were obtained from the base of the skull through the vertex without intravenous contrast. COMPARISON:  01/10/2018 FINDINGS: Brain: Stable 2 cm right thalamus hypodense lesion correlating with the known metastasis status post radiation. No acute intracranial hemorrhage, new infarction, midline shift, herniation, hydrocephalus, extra-axial fluid collection. No focal mass effect or edema. Cisterns are patent. No cerebellar abnormality. Vascular: No hyperdense vessel or unexpected calcification. Skull: Normal. Negative for fracture or focal lesion. Sinuses/Orbits: No acute finding. Other: None. IMPRESSION: Stable 2 cm hypodense right thalamus lesion. No acute intracranial abnormality by noncontrast CT. Electronically Signed   By: Jerilynn Mages.  Shick M.D.   On: 02/12/2018 21:14   Ct Angio Chest Pe W Or Wo Contrast  Result Date: 02/14/2018 CLINICAL DATA:  Shortness of breath.  Metastatic lung cancer. EXAM: CT ANGIOGRAPHY CHEST WITH CONTRAST TECHNIQUE: Multidetector CT imaging of the chest was performed using the standard protocol during bolus administration of intravenous contrast. Multiplanar CT image reconstructions and MIPs were obtained to evaluate the vascular anatomy. 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 No discrete pulmonary emboli. Aortic atherosclerosis. Mediastinum/Nodes: Right hilar mass extending into the mediastinum is significantly smaller than on the prior study. Right upper lobe bronchus is now patent and the right upper lobe has reinflated. Lungs/Pleura: Extensive infiltrate throughout the left lung, most extensive in left upper lobe. Extensive emphysematous changes in the left lung. Pleural based soft tissue mass posteriorly at the right lung base best seen on image 60 of series 4 consistent with metastatic disease. This is slightly smaller than on the prior study. Upper Abdomen: Metastatic lesion in the upper pole of the right kidney. Nodularity of the right adrenal gland probably represents metastatic disease. Musculoskeletal: No chest wall abnormality. No acute or significant osseous findings. Review of the MIP images confirms the above findings. IMPRESSION: 1. No acute pulmonary emboli. 2. 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 3. Decreased size of right hilar mass with new patency of the right upper lobe bronchus with reinflation of the right upper lobe. 4. Extensive infiltrates  throughout the left lung, most prominent in the right upper lobe. 5. Aortic Atherosclerosis (ICD10-I70.0) and Emphysema (ICD10-J43.9). 6. Metastatic disease to the right kidney, right adrenal glands, and to the posterior right chest wall at the right lung base. Electronically Signed   By: Lorriane Shire M.D.   On: 02/14/2018 13:21   Dg Chest Port 1 View  Result Date: 02/19/2018 CLINICAL DATA:  Shortness of breath. History of lung malignancy, sepsis, former smoker. EXAM: PORTABLE CHEST 1 VIEW COMPARISON:  Chest x-ray of February 18, 2018 FINDINGS: The  right hemidiaphragm is higher than the left and the right lung is hypoinflated. There is persistent right hilar density consistent with a known mass. The interstitial markings of the right lung are coarse. On the left diffusely increased lung markings are present. There is no confluent infiltrate. There is no pleural effusion. The heart and pulmonary vascularity are normal. The mediastinum is normal in width. IMPRESSION: Slight increase conspicuity of interstitial markings in the right lung accentuated by elevation of the hemidiaphragm. Persistent diffusely increased lung markings on the left. Overall there has not been dramatic interval change. Electronically Signed   By: David  Martinique M.D.   On: 02/19/2018 09:50   Dg Chest Port 1 View  Result Date: 02/18/2018 CLINICAL DATA:  67 year old female with a history of shortness of breath EXAM: PORTABLE CHEST 1 VIEW COMPARISON:  02/17/2018, 02/16/2018, CT 02/14/2018, CT 12/10/2017 FINDINGS: Cardiomediastinal silhouette unchanged in size and contour. Opacity in the right hilar region, similar to the comparison. Similar appearance of reticulonodular opacity of the left lung. No large pleural effusion. Low lung volumes. No displaced fracture. Right posterior chest wall mass with destructive bony changes is better visualized on prior chest CT. IMPRESSION: Similar appearance of the chest x-ray, with reticulonodular opacity throughout the left lung, similar to prior. Differential includes edema, and/or infection, or potentially post treatment changes. Opacity in the right hilar region corresponds to mass on prior CT images. Electronically Signed   By: Corrie Mckusick D.O.   On: 02/18/2018 09:44   Dg Chest Port 1 View  Result Date: 02/17/2018 CLINICAL DATA:  Shortness of breath. EXAM: PORTABLE CHEST 1 VIEW COMPARISON:  Radiograph of February 16, 2018. FINDINGS: The heart size and mediastinal contours are within normal limits. Atherosclerosis of thoracic aorta is noted. No  pneumothorax or pleural effusion is noted. Stable mild diffuse interstitial density seen in left lung consistent with inflammation or asymmetric edema. Stable minimal right basilar opacity is noted. The visualized skeletal structures are unremarkable. IMPRESSION: Stable mild diffuse interstitial left lung opacity is noted consistent with inflammation or asymmetric edema. Stable minimal right basilar opacity. Aortic Atherosclerosis (ICD10-I70.0). Electronically Signed   By: Marijo Conception, M.D.   On: 02/17/2018 07:21   Dg Chest Port 1 View  Result Date: 02/16/2018 CLINICAL DATA:  Lung cancer.  Brain metastases. EXAM: PORTABLE CHEST 1 VIEW COMPARISON:  February 14, 2018 FINDINGS: The cardiomediastinal silhouette is stable. No pneumothorax. Diffuse left infiltrate, similar to slightly improved in the interval. Patchy infiltrate in the right lung is stable. IMPRESSION: Minimal improvement of the diffuse left infiltrate. Mild patchy opacity in the right lung is stable. No other changes. Electronically Signed   By: Dorise Bullion III M.D   On: 02/16/2018 07:46   Dg Chest Port 1 View  Result Date: 02/14/2018 CLINICAL DATA:  Shortness of breath. EXAM: PORTABLE CHEST 1 VIEW COMPARISON:  Radiograph of February 12, 2018. FINDINGS: Stable cardiomediastinal silhouette. No pneumothorax is noted. Right lung is  clear. Increased diffuse left lung opacity is noted concerning for worsening pneumonia or less likely asymmetric edema. No significant pleural effusion is noted. Bony thorax is unremarkable. IMPRESSION: Increased diffuse left lung opacity is noted concerning for worsening pneumonia or less likely asymmetric edema. Electronically Signed   By: Marijo Conception, M.D.   On: 02/14/2018 08:52   Dg Chest Port 1 View  Result Date: 02/12/2018 CLINICAL DATA:  Increasing shortness of breath. Low oxygen saturation. EXAM: PORTABLE CHEST 1 VIEW COMPARISON:  02/12/2018 FINDINGS: Shallow inspiration with elevation of the right  hemidiaphragm. Mild linear atelectasis in the lung bases. There is asymmetrical interstitial infiltration throughout the left lung. This could represent asymmetrical edema or interstitial pneumonia. Similar appearance to previous study. No pleural effusions. No pneumothorax. Heart size and pulmonary vascularity are normal. IMPRESSION: Asymmetric interstitial pattern throughout the left lung likely represents interstitial pneumonia or asymmetrical edema. No change. Electronically Signed   By: Lucienne Capers M.D.   On: 02/12/2018 22:14   Dg Chest Port 1 View  Result Date: 02/12/2018 CLINICAL DATA:  Weakness, shortness of Breath EXAM: PORTABLE CHEST 1 VIEW COMPARISON:  12/11/2017 FINDINGS: Diffuse interstitial prominence throughout the left lung. Right lung is clear. Heart is normal size. No effusions. No acute bony abnormality. IMPRESSION: Diffuse interstitial prominence throughout the left lung, likely pneumonia. Electronically Signed   By: Rolm Baptise M.D.   On: 02/12/2018 17:42   US Abdomen Limited Ruq  Result Date: 02/14/2018 CLINICAL DATA:  Abnormal LFTs EXAM: ULTRASOUND ABDOMEN LIMITED RIGHT UPPER QUADRANT COMPARISON:  None. FINDINGS: Gallbladder: Gallbladder is well distended. Mild gallbladder sludge is noted within. No gallstones are seen. No wall thickening is noted. Common bile duct: Diameter: 1.9 mm Liver: Mild increased echogenicity is identified consistent with fatty infiltration. No focal mass is seen. Portal vein is patent on color Doppler imaging with normal direction of blood flow towards the liver. IMPRESSION: Gallbladder sludge. Fatty liver. Electronically Signed   By: Inez Catalina M.D.   On: 02/14/2018 10:38    Impression/Plan: 1.  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 The patient has recovered from the effects of radiotherapy and is in the process of getting restarted with  chemotherapy. She will return for repeat MRI brain in 1 month, and see [place] back after her next 3 months later. I will call her with the first MRI results after conference discussion. She will keep me informed of her status. I started dexamethasone 4 mg 1/2 tab po BID for the first taper instruction. I'm not sure why she's been taking this continuously at 4mg  BID, but we did discuss long term issues with dexamethasone and prophylaxis rationale. A new rx was sent to her pharmacy.      Carola Rhine, PAC

## 2018-03-04 NOTE — Telephone Encounter (Signed)
requests sharpie container and high rise toilet seat, order sent .

## 2018-03-05 ENCOUNTER — Other Ambulatory Visit: Payer: Self-pay | Admitting: Medical Oncology

## 2018-03-05 ENCOUNTER — Ambulatory Visit: Payer: Medicare HMO

## 2018-03-05 ENCOUNTER — Telehealth: Payer: Self-pay | Admitting: *Deleted

## 2018-03-05 ENCOUNTER — Inpatient Hospital Stay: Payer: Medicare HMO

## 2018-03-05 DIAGNOSIS — C3491 Malignant neoplasm of unspecified part of right bronchus or lung: Secondary | ICD-10-CM

## 2018-03-05 NOTE — Telephone Encounter (Signed)
Received call from [facility] PT asking for verbal order for [facility] PT for strength, balance, & ambulation for 1x/wk x 2, 2x/wk x 1, then 1x/wk x 1.  Call back # is [phone].  Message routed to Dr Mohamed/Pod RN

## 2018-03-05 NOTE — Telephone Encounter (Signed)
Called to PT .

## 2018-03-05 NOTE — Telephone Encounter (Signed)
Ok with me 

## 2018-03-06 ENCOUNTER — Telehealth: Payer: Self-pay | Admitting: *Deleted

## 2018-03-06 ENCOUNTER — Other Ambulatory Visit: Payer: Self-pay | Admitting: *Deleted

## 2018-03-06 NOTE — Telephone Encounter (Signed)
Rash on her buttocks. Itching red, raised. No incontinence, discharged Friday. Possible yeast type rash, or heat rash. Pt had this rash in hospital and nobody did anything. Home health is supposed to come out but not sure when.they are coming back. Called [facility]ath [phone] pts RN Theodosia Quay not available, transferred to [facility].(pt care manager) will contact [facility] RN and send a nurse out to visit with pt to check status.  Pt was scheduled to have a visit 5/17 however they are short staffed and rescheduled it for Saturday visit. Discussed the importance of pt's [facility] visit, PCM advised she will call Lauren, RN about going to to see pt tomorrow and evaluate rash. Spoke with [name] ( sister) discussed above information, instructed [name] to give pt Benadryl for Rash, try hydrocortisone to affected area and sleep without undergarments if she can to allow air to circulate and decrease constriction to area. No further concerns.

## 2018-03-12 ENCOUNTER — Inpatient Hospital Stay: Payer: Medicare HMO | Admitting: Internal Medicine

## 2018-03-12 ENCOUNTER — Telehealth: Payer: Self-pay

## 2018-03-12 ENCOUNTER — Inpatient Hospital Stay: Payer: Medicare HMO

## 2018-03-12 ENCOUNTER — Encounter: Payer: Self-pay | Admitting: Internal Medicine

## 2018-03-12 VITALS — BP 94/75 | HR 135 | Temp 98.1°F | Resp 16 | Ht 63.0 in | Wt 115.7 lb

## 2018-03-12 DIAGNOSIS — M6281 Muscle weakness (generalized): Secondary | ICD-10-CM | POA: Diagnosis not present

## 2018-03-12 DIAGNOSIS — I82492 Acute embolism and thrombosis of other specified deep vein of left lower extremity: Secondary | ICD-10-CM

## 2018-03-12 DIAGNOSIS — Z923 Personal history of irradiation: Secondary | ICD-10-CM | POA: Diagnosis not present

## 2018-03-12 DIAGNOSIS — C3401 Malignant neoplasm of right main bronchus: Secondary | ICD-10-CM

## 2018-03-12 DIAGNOSIS — R000 Tachycardia, unspecified: Secondary | ICD-10-CM | POA: Diagnosis not present

## 2018-03-12 DIAGNOSIS — C3491 Malignant neoplasm of unspecified part of right bronchus or lung: Secondary | ICD-10-CM

## 2018-03-12 DIAGNOSIS — I82409 Acute embolism and thrombosis of unspecified deep veins of unspecified lower extremity: Secondary | ICD-10-CM

## 2018-03-12 DIAGNOSIS — E46 Unspecified protein-calorie malnutrition: Secondary | ICD-10-CM | POA: Diagnosis not present

## 2018-03-12 DIAGNOSIS — R5383 Other fatigue: Secondary | ICD-10-CM

## 2018-03-12 DIAGNOSIS — E860 Dehydration: Secondary | ICD-10-CM

## 2018-03-12 DIAGNOSIS — R630 Anorexia: Secondary | ICD-10-CM

## 2018-03-12 DIAGNOSIS — J449 Chronic obstructive pulmonary disease, unspecified: Secondary | ICD-10-CM

## 2018-03-12 DIAGNOSIS — C7900 Secondary malignant neoplasm of unspecified kidney and renal pelvis: Secondary | ICD-10-CM

## 2018-03-12 DIAGNOSIS — E440 Moderate protein-calorie malnutrition: Secondary | ICD-10-CM

## 2018-03-12 DIAGNOSIS — Z79899 Other long term (current) drug therapy: Secondary | ICD-10-CM

## 2018-03-12 DIAGNOSIS — J9819 Other pulmonary collapse: Secondary | ICD-10-CM

## 2018-03-12 DIAGNOSIS — I959 Hypotension, unspecified: Secondary | ICD-10-CM

## 2018-03-12 DIAGNOSIS — C3411 Malignant neoplasm of upper lobe, right bronchus or lung: Secondary | ICD-10-CM

## 2018-03-12 DIAGNOSIS — Z8701 Personal history of pneumonia (recurrent): Secondary | ICD-10-CM

## 2018-03-12 DIAGNOSIS — Z5112 Encounter for antineoplastic immunotherapy: Secondary | ICD-10-CM | POA: Diagnosis not present

## 2018-03-12 DIAGNOSIS — R634 Abnormal weight loss: Secondary | ICD-10-CM

## 2018-03-12 DIAGNOSIS — C7931 Secondary malignant neoplasm of brain: Secondary | ICD-10-CM | POA: Diagnosis not present

## 2018-03-12 DIAGNOSIS — Z7901 Long term (current) use of anticoagulants: Secondary | ICD-10-CM

## 2018-03-12 LAB — CMP (CANCER CENTER ONLY)
ALBUMIN: 2.9 g/dL — AB (ref 3.5–5.0)
ALK PHOS: 76 U/L (ref 40–150)
ALT: 31 U/L (ref 0–55)
AST: 19 U/L (ref 5–34)
Anion gap: 13 — ABNORMAL HIGH (ref 3–11)
BUN: 15 mg/dL (ref 7–26)
CO2: 26 mmol/L (ref 22–29)
CREATININE: 0.63 mg/dL (ref 0.60–1.10)
Calcium: 9.9 mg/dL (ref 8.4–10.4)
Chloride: 102 mmol/L (ref 98–109)
GFR, Est AFR Am: >60 mL/min (ref >60)
GFR, Estimated: >60 mL/min (ref >60)
Glucose, Bld: 153 mg/dL — ABNORMAL HIGH (ref 70–140)
POTASSIUM: 3.4 mmol/L — AB (ref 3.5–5.1)
SODIUM: 141 mmol/L (ref 136–145)
Total Bilirubin: 0.2 mg/dL (ref 0.2–1.2)
Total Protein: 7 g/dL (ref 6.4–8.3)

## 2018-03-12 LAB — CBC WITH DIFFERENTIAL (CANCER CENTER ONLY)
BASOS ABS: 0 10*3/uL (ref 0.0–0.1)
Basophils Relative: 0 %
EOS PCT: 3 %
Eosinophils Absolute: 0.3 10*3/uL (ref 0.0–0.5)
HEMATOCRIT: 29.4 % — AB (ref 34.8–46.6)
Hemoglobin: 9.6 g/dL — ABNORMAL LOW (ref 11.6–15.9)
LYMPHS ABS: 0.7 10*3/uL — AB (ref 0.9–3.3)
LYMPHS PCT: 8 %
MCH: 31.3 pg (ref 25.1–34.0)
MCHC: 32.7 g/dL (ref 31.5–36.0)
MCV: 95.8 fL (ref 79.5–101.0)
Monocytes Absolute: 0.4 10*3/uL (ref 0.1–0.9)
Monocytes Relative: 4 %
NEUTROS ABS: 8.3 10*3/uL — AB (ref 1.5–6.5)
NEUTROS PCT: 85 %
NRBC: 2 /100{WBCs} — AB
Platelet Count: 361 10*3/uL (ref 145–400)
RBC: 3.07 MIL/uL — AB (ref 3.70–5.45)
RDW: 18.2 % — ABNORMAL HIGH (ref 11.2–14.5)
WBC: 9.8 10*3/uL (ref 3.9–10.3)

## 2018-03-12 MED ORDER — FAMOTIDINE IN NACL 20-0.9 MG/50ML-% IV SOLN
INTRAVENOUS | Status: AC
  Filled 2018-03-12: qty 50

## 2018-03-12 MED ORDER — FAMOTIDINE IN NACL 20-0.9 MG/50ML-% IV SOLN
20.0000 mg | Freq: Once | INTRAVENOUS | Status: AC
  Administered 2018-03-12: 20 mg via INTRAVENOUS

## 2018-03-12 MED ORDER — SODIUM CHLORIDE 0.9 % IV SOLN
305.6000 mg | Freq: Once | INTRAVENOUS | Status: AC
  Administered 2018-03-12: 310 mg via INTRAVENOUS
  Filled 2018-03-12: qty 31

## 2018-03-12 MED ORDER — SODIUM CHLORIDE 0.9 % IV SOLN: 1000.0000 mL | Freq: Once | INTRAVENOUS | Status: DC

## 2018-03-12 MED ORDER — DIPHENHYDRAMINE HCL 50 MG/ML IJ SOLN
50.0000 mg | Freq: Once | INTRAMUSCULAR | Status: AC
  Administered 2018-03-12: 50 mg via INTRAVENOUS

## 2018-03-12 MED ORDER — SODIUM CHLORIDE 0.9 % IV SOLN
200.0000 mg | Freq: Once | INTRAVENOUS | Status: AC
  Administered 2018-03-12: 200 mg via INTRAVENOUS
  Filled 2018-03-12: qty 8

## 2018-03-12 MED ORDER — PALONOSETRON HCL INJECTION 0.25 MG/5ML
0.2500 mg | Freq: Once | INTRAVENOUS | Status: AC
  Administered 2018-03-12: 0.25 mg via INTRAVENOUS

## 2018-03-12 MED ORDER — PALONOSETRON HCL INJECTION 0.25 MG/5ML
INTRAVENOUS | Status: AC
  Filled 2018-03-12: qty 5

## 2018-03-12 MED ORDER — DIPHENHYDRAMINE HCL 50 MG/ML IJ SOLN
INTRAMUSCULAR | Status: AC
  Filled 2018-03-12: qty 1

## 2018-03-12 MED ORDER — SODIUM CHLORIDE 0.9 % IV SOLN
Freq: Once | INTRAVENOUS | Status: AC
  Administered 2018-03-12: 11:00:00 via INTRAVENOUS
  Filled 2018-03-12: qty 5

## 2018-03-12 MED ORDER — SODIUM CHLORIDE 0.9 % IV SOLN
Freq: Once | INTRAVENOUS | Status: AC
  Administered 2018-03-12: 11:00:00 via INTRAVENOUS

## 2018-03-12 MED ORDER — SODIUM CHLORIDE 0.9 % IV SOLN
150.0000 mg/m2 | Freq: Once | INTRAVENOUS | Status: AC
  Administered 2018-03-12: 240 mg via INTRAVENOUS
  Filled 2018-03-12: qty 40

## 2018-03-12 NOTE — Patient Instructions (Signed)
[facility] Discharge Instructions for Patients Receiving Chemotherapy  Today you received the following chemotherapy agents: Taxol, Keytruda and Carboplatin.  To help prevent nausea and vomiting after your treatment, we encourage you to take your nausea medication as prescribed.    If you develop nausea and vomiting that is not controlled by your nausea medication, call the clinic.   BELOW ARE SYMPTOMS THAT SHOULD BE REPORTED IMMEDIATELY:  *FEVER GREATER THAN 100.5 F  *CHILLS WITH OR WITHOUT FEVER  NAUSEA AND VOMITING THAT IS NOT CONTROLLED WITH YOUR NAUSEA MEDICATION  *UNUSUAL SHORTNESS OF BREATH  *UNUSUAL BRUISING OR BLEEDING  TENDERNESS IN MOUTH AND THROAT WITH OR WITHOUT PRESENCE OF ULCERS  *URINARY PROBLEMS  *BOWEL PROBLEMS  UNUSUAL RASH Items with * indicate a potential emergency and should be followed up as soon as possible.  Feel free to call the clinic should you have any questions or concerns. The clinic phone number is [phone].  Please show the [place] at check-in to the Emergency Department and triage nurse.

## 2018-03-12 NOTE — Telephone Encounter (Signed)
Printed avs and calender of upcoming appointment. Per 5/22 los

## 2018-03-12 NOTE — Progress Notes (Signed)
[facility] [phone]) [phone]   [phone]) [phone]  OFFICE PROGRESS NOTE  [name], MD [address] [place] [address]  DIAGNOSIS: Stage IVnon-small cell lung cancer, squamous cell carcinoma presented with large right hilar mass with encasement of the right pulmonary artery as well as superior vena cava and right upper lobe collapse in addition to precarinal lymphadenopathy diagnosed in February 2019.  PRIOR THERAPY: SRS to the brain lesion and palliative radiation to therightupper lobe of the lung lesion.The patient completed treatment on 01/17/2018.  CURRENT THERAPY: palliative systemic chemotherapy with carboplatin for an AUC of 5, paclitaxel 175 mg/m, and Keytruda 200 mg IV given every 3 weeks. First dose 01/29/2018.  Status post 1 cycle.  INTERVAL HISTORY: Beth Alexander 67 y.o. female returns to the clinic today for follow-up visit accompanied by her sister.  The patient is feeling fine today with no specific complaints except for fatigue.  She was recently admitted to [facility] after her first cycle of the chemotherapy with questionable pneumonia as well as deep venous thrombosis and COPD.  She was a started on treatment with Lovenox for her deep venous thrombosis.  The patient is recovering well and feeling better.  She still have poor p.o. intake.  She denied having any chest pain, cough or hemoptysis but has baseline shortness of breath.  She denied having any nausea, vomiting, diarrhea or constipation.  She is here today for reevaluation before starting cycle #2.  MEDICAL HISTORY: Past Medical History:  Diagnosis Date  . Brain metastasis ([place])   . Hypertension   . lung ca dx'd 11/2017   xrt comp; chemo ongoing  . 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 Tobacco use     ALLERGIES:  has No Known Allergies.  MEDICATIONS:  Current Outpatient Medications  Medication Sig Dispense Refill  . acetaminophen (TYLENOL) 325 MG tablet Take 2  tablets (650 mg total) by mouth every 6 (six) hours as needed for mild pain (or Fever >/= 101).    Marland Kitchen arformoterol (BROVANA) 15 MCG/2ML NEBU Take 2 mLs (15 mcg total) by nebulization 2 (two) times daily. 120 mL 0  . benzonatate (TESSALON) 100 MG capsule Take 1 capsule (100 mg total) by mouth 3 (three) times daily as needed for cough. 21 capsule 0  . dexamethasone (DECADRON) 4 MG tablet Take 1 tablet (4 mg total) by mouth 2 (two) times daily with a meal. 60 tablet 0  . enoxaparin (LOVENOX) 60 MG/0.6ML injection Inject 0.6 mLs (60 mg total) into the skin every 12 (twelve) hours. 60 Syringe 0  . feeding supplement, ENSURE ENLIVE, (ENSURE ENLIVE) LIQD Take 237 mLs by mouth 2 (two) times daily between meals. 237 mL 12  . guaiFENesin (MUCINEX) 600 MG 12 hr tablet Take 2 tablets (1,200 mg total) by mouth 2 (two) times daily. 40 tablet 0  . Multiple Vitamin (MULTIVITAMIN WITH MINERALS) TABS tablet Take 1 tablet by mouth daily. 30 tablet 0  . sertraline (ZOLOFT) 50 MG tablet Take 1/2 tab daily for 1 week and than full tab daily 30 tablet 0  . sodium chloride (OCEAN) 0.65 % SOLN nasal spray Place 1 spray into both nostrils as needed for congestion. 1 Bottle 0   No current facility-administered medications for this visit.     SURGICAL HISTORY:  Past Surgical History:  Procedure Laterality Date  . BREAST LUMPECTOMY WITH RADIOACTIVE SEED LOCALIZATION Left 11/08/2016   Procedure: LEFT BREAST LUMPECTOMY WITH RADIOACTIVE SEED LOCALIZATION;  Surgeon:  Donnie Mesa, MD;  Location: [facility];  Service: General;  Laterality: Left;  Marland Kitchen VIDEO BRONCHOSCOPY WITH ENDOBRONCHIAL ULTRASOUND N/A 12/11/2017   Procedure: VIDEO BRONCHOSCOPY WITH ENDOBRONCHIAL ULTRASOUND;  Surgeon: Marshell Garfinkel, MD;  Location: [facility];  Service: Pulmonary;  Laterality: N/A;    REVIEW OF SYSTEMS:  Constitutional: positive for anorexia, fatigue and weight loss Eyes: negative Ears, nose, mouth, throat, and face:  negative Respiratory: positive for dyspnea on exertion Cardiovascular: negative Gastrointestinal: negative Genitourinary:negative Integument/breast: negative Hematologic/lymphatic: negative Musculoskeletal:positive for muscle weakness Neurological: negative Behavioral/Psych: negative Endocrine: negative Allergic/Immunologic: negative   PHYSICAL EXAMINATION: General appearance: alert, cooperative, fatigued and no distress Head: Normocephalic, without obvious abnormality, atraumatic Neck: no adenopathy, no JVD, supple, symmetrical, trachea midline and thyroid not enlarged, symmetric, no tenderness/mass/nodules Lymph nodes: Cervical, supraclavicular, and axillary nodes normal. Resp: clear to auscultation bilaterally Back: symmetric, no curvature. ROM normal. No CVA tenderness. Cardio: regular rate and rhythm, S1, S2 normal, no murmur, click, rub or gallop GI: soft, non-tender; bowel sounds normal; no masses,  no organomegaly Extremities: extremities normal, atraumatic, no cyanosis or edema Neurologic: Alert and oriented X 3, normal strength and tone. Normal symmetric reflexes. Normal coordination and gait  ECOG PERFORMANCE STATUS: 1 - Symptomatic but completely ambulatory  Blood pressure 94/75, pulse (!) 135, temperature 98.1 F (36.7 C), temperature source Oral, resp. rate 16, height 5\' 3"  (1.6 m), weight 115 lb 11.2 oz (52.5 kg), SpO2 97 %.  LABORATORY DATA: Lab Results  Component Value Date   WBC 8.1 03/04/2018   HGB 9.3 (L) 03/04/2018   HCT 26.9 (L) 03/04/2018   MCV 93.3 03/04/2018   PLT 295 03/04/2018      Chemistry      Component Value Date/Time   NA 141 03/04/2018 1036   K 3.6 03/04/2018 1036   CL 102 03/04/2018 1036   CO2 30 (H) 03/04/2018 1036   BUN 14 03/04/2018 1036   CREATININE 0.52 (L) 03/04/2018 1036      Component Value Date/Time   CALCIUM 9.9 03/04/2018 1036   ALKPHOS 60 03/04/2018 1036   AST 20 03/04/2018 1036   ALT 42 03/04/2018 1036   BILITOT 0.2  03/04/2018 1036       RADIOGRAPHIC STUDIES: Dg Chest 2 View  Result Date: 02/26/2018 CLINICAL DATA:  Sepsis, pneumonia, fatigue. EXAM: CHEST - 2 VIEW COMPARISON:  02/19/2018. FINDINGS: Stable exam, with BILATERAL pulmonary opacities, and RIGHT hilar mass. RIGHT hemidiaphragm elevation. No significant effusion or pneumothorax. Unchanged cardiac silhouette. No acute osseous findings. IMPRESSION: Stable chest. Electronically Signed   By: Staci Righter M.D.   On: 02/26/2018 23:08   Ct Head Wo Contrast  Result Date: 02/12/2018 CLINICAL DATA:  Altered level of consciousness, history of right thalamus brain metastasis, status post radiation EXAM: CT HEAD WITHOUT CONTRAST TECHNIQUE: Contiguous axial images were obtained from the base of the skull through the vertex without intravenous contrast. COMPARISON:  01/10/2018 FINDINGS: Brain: Stable 2 cm right thalamus hypodense lesion correlating with the known metastasis status post radiation. No acute intracranial hemorrhage, new infarction, midline shift, herniation, hydrocephalus, extra-axial fluid collection. No focal mass effect or edema. Cisterns are patent. No cerebellar abnormality. Vascular: No hyperdense vessel or unexpected calcification. Skull: Normal. Negative for fracture or focal lesion. Sinuses/Orbits: No acute finding. Other: None. IMPRESSION: Stable 2 cm hypodense right thalamus lesion. No acute intracranial abnormality by noncontrast CT. Electronically Signed   By: Jerilynn Mages.  Shick M.D.   On: 02/12/2018 21:14   Ct Angio Chest Pe W Or  Wo Contrast  Result Date: 02/14/2018 CLINICAL DATA:  Shortness of breath.  Metastatic lung cancer. EXAM: CT ANGIOGRAPHY CHEST WITH CONTRAST TECHNIQUE: Multidetector CT imaging of the chest was performed using the standard protocol during bolus administration of intravenous contrast. Multiplanar CT image reconstructions and MIPs were obtained to evaluate the vascular anatomy. 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 No discrete pulmonary emboli. Aortic atherosclerosis. Mediastinum/Nodes: Right hilar mass extending into the mediastinum is significantly smaller than on the prior study. Right upper lobe bronchus is now patent and the right upper lobe has reinflated. Lungs/Pleura: Extensive infiltrate throughout the left lung, most extensive in left upper lobe. Extensive emphysematous changes in the left lung. Pleural based soft tissue mass posteriorly at the right lung base best seen on image 60 of series 4 consistent with metastatic disease. This is slightly smaller than on the prior study. Upper Abdomen: Metastatic lesion in the upper pole of the right kidney. Nodularity of the right adrenal gland probably represents metastatic disease. Musculoskeletal: No chest wall abnormality. No acute or significant osseous findings. Review of the MIP images confirms the above findings. IMPRESSION: 1. No acute pulmonary emboli. 2. 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 3. Decreased size of right hilar mass with new patency of the right upper lobe bronchus with reinflation of the right upper lobe. 4. Extensive infiltrates throughout the left lung, most prominent in the right upper lobe. 5. Aortic Atherosclerosis (ICD10-I70.0) and Emphysema (ICD10-J43.9). 6. Metastatic disease to the right kidney, right adrenal glands, and to the posterior right chest wall at the right lung base. Electronically Signed   By: Lorriane Shire M.D.   On: 02/14/2018 13:21   Dg Chest Port 1 View  Result Date: 02/19/2018 CLINICAL DATA:  Shortness of breath. History of lung malignancy, sepsis, former smoker. EXAM: PORTABLE CHEST 1 VIEW COMPARISON:  Chest x-ray of February 18, 2018  FINDINGS: The right hemidiaphragm is higher than the left and the right lung is hypoinflated. There is persistent right hilar density consistent with a known mass. The interstitial markings of the right lung are coarse. On the left diffusely increased lung markings are present. There is no confluent infiltrate. There is no pleural effusion. The heart and pulmonary vascularity are normal. The mediastinum is normal in width. IMPRESSION: Slight increase conspicuity of interstitial markings in the right lung accentuated by elevation of the hemidiaphragm. Persistent diffusely increased lung markings on the left. Overall there has not been dramatic interval change. Electronically Signed   By: David  Martinique M.D.   On: 02/19/2018 09:50   Dg Chest Port 1 View  Result Date: 02/18/2018 CLINICAL DATA:  67 year old female with a history of shortness of breath EXAM: PORTABLE CHEST 1 VIEW COMPARISON:  02/17/2018, 02/16/2018, CT 02/14/2018, CT 12/10/2017 FINDINGS: Cardiomediastinal silhouette unchanged in size and contour. Opacity in the right hilar region, similar to the comparison. Similar appearance of reticulonodular opacity of the left lung. No large pleural effusion. Low lung volumes. No displaced fracture. Right posterior chest wall mass with destructive bony changes is better visualized on prior chest CT. IMPRESSION: Similar appearance of the chest x-ray, with reticulonodular opacity throughout the left lung, similar to prior. Differential includes edema, and/or infection, or potentially post treatment changes. Opacity in the right hilar region  corresponds to mass on prior CT images. Electronically Signed   By: Corrie Mckusick D.O.   On: 02/18/2018 09:44   Dg Chest Port 1 View  Result Date: 02/17/2018 CLINICAL DATA:  Shortness of breath. EXAM: PORTABLE CHEST 1 VIEW COMPARISON:  Radiograph of February 16, 2018. FINDINGS: The heart size and mediastinal contours are within normal limits. Atherosclerosis of thoracic aorta is  noted. No pneumothorax or pleural effusion is noted. Stable mild diffuse interstitial density seen in left lung consistent with inflammation or asymmetric edema. Stable minimal right basilar opacity is noted. The visualized skeletal structures are unremarkable. IMPRESSION: Stable mild diffuse interstitial left lung opacity is noted consistent with inflammation or asymmetric edema. Stable minimal right basilar opacity. Aortic Atherosclerosis (ICD10-I70.0). Electronically Signed   By: Marijo Conception, M.D.   On: 02/17/2018 07:21   Dg Chest Port 1 View  Result Date: 02/16/2018 CLINICAL DATA:  Lung cancer.  Brain metastases. EXAM: PORTABLE CHEST 1 VIEW COMPARISON:  February 14, 2018 FINDINGS: The cardiomediastinal silhouette is stable. No pneumothorax. Diffuse left infiltrate, similar to slightly improved in the interval. Patchy infiltrate in the right lung is stable. IMPRESSION: Minimal improvement of the diffuse left infiltrate. Mild patchy opacity in the right lung is stable. No other changes. Electronically Signed   By: Dorise Bullion III M.D   On: 02/16/2018 07:46   Dg Chest Port 1 View  Result Date: 02/14/2018 CLINICAL DATA:  Shortness of breath. EXAM: PORTABLE CHEST 1 VIEW COMPARISON:  Radiograph of February 12, 2018. FINDINGS: Stable cardiomediastinal silhouette. No pneumothorax is noted. Right lung is clear. Increased diffuse left lung opacity is noted concerning for worsening pneumonia or less likely asymmetric edema. No significant pleural effusion is noted. Bony thorax is unremarkable. IMPRESSION: Increased diffuse left lung opacity is noted concerning for worsening pneumonia or less likely asymmetric edema. Electronically Signed   By: Marijo Conception, M.D.   On: 02/14/2018 08:52   Dg Chest Port 1 View  Result Date: 02/12/2018 CLINICAL DATA:  Increasing shortness of breath. Low oxygen saturation. EXAM: PORTABLE CHEST 1 VIEW COMPARISON:  02/12/2018 FINDINGS: Shallow inspiration with elevation of the  right hemidiaphragm. Mild linear atelectasis in the lung bases. There is asymmetrical interstitial infiltration throughout the left lung. This could represent asymmetrical edema or interstitial pneumonia. Similar appearance to previous study. No pleural effusions. No pneumothorax. Heart size and pulmonary vascularity are normal. IMPRESSION: Asymmetric interstitial pattern throughout the left lung likely represents interstitial pneumonia or asymmetrical edema. No change. Electronically Signed   By: Lucienne Capers M.D.   On: 02/12/2018 22:14   Dg Chest Port 1 View  Result Date: 02/12/2018 CLINICAL DATA:  Weakness, shortness of Breath EXAM: PORTABLE CHEST 1 VIEW COMPARISON:  12/11/2017 FINDINGS: Diffuse interstitial prominence throughout the left lung. Right lung is clear. Heart is normal size. No effusions. No acute bony abnormality. IMPRESSION: Diffuse interstitial prominence throughout the left lung, likely pneumonia. Electronically Signed   By: Rolm Baptise M.D.   On: 02/12/2018 17:42   US Abdomen Limited Ruq  Result Date: 02/14/2018 CLINICAL DATA:  Abnormal LFTs EXAM: ULTRASOUND ABDOMEN LIMITED RIGHT UPPER QUADRANT COMPARISON:  None. FINDINGS: Gallbladder: Gallbladder is well distended. Mild gallbladder sludge is noted within. No gallstones are seen. No wall thickening is noted. Common bile duct: Diameter: 1.9 mm Liver: Mild increased echogenicity is identified consistent with fatty infiltration. No focal mass is seen. Portal vein is patent on color Doppler imaging with normal direction of blood flow towards the liver. IMPRESSION: Gallbladder  sludge. Fatty liver. Electronically Signed   By: Inez Catalina M.D.   On: 02/14/2018 10:38    ASSESSMENT AND PLAN: This is a very pleasant 67 years old African-American female with metastatic non-small cell lung cancer, squamous cell carcinoma. She is currently undergoing systemic chemotherapy with carboplatin, paclitaxel and Keytruda status post 1 cycle.  She  has a rough time with the first cycle of her treatment and she was treated for pneumonia and deep venous thrombosis earlier this month. She is currently on Lovenox for the deep venous thrombosis. I had a lengthy discussion with the patient and her sister today about her condition and treatment options.  I discussed with the patient the option of palliative care versus continuation of her palliative systemic chemotherapy.  She is still interested in treatment and would like to proceed with cycle #2 today.  I will reduce the dose of carboplatin to AUC of 4 and paclitaxel to 150 MG/M2 starting from cycle #2. For the tachycardia and dehydration, I will arrange for the patient to receive 1 L of normal saline during her treatment today. She was also encouraged to increase her oral intake. For the weight loss and malnutrition, I will refer the patient to the dietitian at the cancer center for evaluation and management of her condition. The patient will come back for follow-up visit in 3 weeks for evaluation before the next cycle of her treatment. She was advised to call immediately if she has any concerning symptoms in the interval. The patient voices understanding of current disease status and treatment options and is in agreement with the current care plan.  All questions were answered. The patient knows to call the clinic with any problems, questions or concerns. We can certainly see the patient much sooner if necessary.  I spent 15 minutes counseling the patient face to face. The total time spent in the appointment was 25 minutes.  Disclaimer: This note was dictated with voice recognition software. Similar sounding words can inadvertently be transcribed and may not be corrected upon review.

## 2018-03-14 ENCOUNTER — Telehealth: Payer: Self-pay | Admitting: *Deleted

## 2018-03-14 NOTE — Telephone Encounter (Signed)
Pt sister called advised pt will be out of medication by Tuesday. Advised [name] this is a medication that will need to be filled by PCP or Pulmonary. Called pt's PCP s/w Vicente Males requested they reach out to [place] with instructions for the pt regarding a follow up and refills on medications or changes to medications she is currently taking. Returned call to [facility] advised PCP office will reach out to her. Murriel Hopper [facility] # if she needed a different PCP. As this is part of [facility] and a walk in clinic. [name] thanked me for the call. No further concerns.

## 2018-03-14 NOTE — Telephone Encounter (Signed)
Pt sister [name] called with request for Brovana refill. Pt will run out before Tuesday. Murriel Hopper Pt's PCP and Pulmonary Dr's information/phone#, instructed her to contact them for this medication refill. Will review with MD and call [name] with any additional information.

## 2018-03-19 ENCOUNTER — Inpatient Hospital Stay: Payer: Medicare HMO

## 2018-03-19 ENCOUNTER — Other Ambulatory Visit: Payer: Self-pay | Admitting: Medical Oncology

## 2018-03-19 ENCOUNTER — Telehealth: Payer: Self-pay | Admitting: Internal Medicine

## 2018-03-19 ENCOUNTER — Telehealth: Payer: Self-pay

## 2018-03-19 ENCOUNTER — Telehealth: Payer: Self-pay | Admitting: Medical Oncology

## 2018-03-19 DIAGNOSIS — D649 Anemia, unspecified: Secondary | ICD-10-CM

## 2018-03-19 DIAGNOSIS — C3491 Malignant neoplasm of unspecified part of right bronchus or lung: Secondary | ICD-10-CM

## 2018-03-19 DIAGNOSIS — Z5112 Encounter for antineoplastic immunotherapy: Secondary | ICD-10-CM | POA: Diagnosis not present

## 2018-03-19 LAB — CBC WITH DIFFERENTIAL (CANCER CENTER ONLY)
Basophils Absolute: 0 10*3/uL (ref 0.0–0.1)
Basophils Relative: 0 %
Eosinophils Absolute: 0.1 10*3/uL (ref 0.0–0.5)
Eosinophils Relative: 1 %
HEMATOCRIT: 25.4 % — AB (ref 34.8–46.6)
HEMOGLOBIN: 8.2 g/dL — AB (ref 11.6–15.9)
LYMPHS ABS: 0.4 10*3/uL — AB (ref 0.9–3.3)
LYMPHS PCT: 9 %
MCH: 30.7 pg (ref 25.1–34.0)
MCHC: 32.3 g/dL (ref 31.5–36.0)
MCV: 95.1 fL (ref 79.5–101.0)
MONO ABS: 0.4 10*3/uL (ref 0.1–0.9)
Monocytes Relative: 8 %
Neutro Abs: 3.7 10*3/uL (ref 1.5–6.5)
Neutrophils Relative %: 82 %
Platelet Count: 324 10*3/uL (ref 145–400)
RBC: 2.67 MIL/uL — ABNORMAL LOW (ref 3.70–5.45)
RDW: 17.9 % — ABNORMAL HIGH (ref 11.2–14.5)
WBC Count: 4.6 10*3/uL (ref 3.9–10.3)
nRBC: 1 /100 WBC — ABNORMAL HIGH

## 2018-03-19 LAB — CMP (CANCER CENTER ONLY)
ALBUMIN: 2.6 g/dL — AB (ref 3.5–5.0)
ALT: 26 U/L (ref 0–55)
AST: 19 U/L (ref 5–34)
Alkaline Phosphatase: 77 U/L (ref 40–150)
Anion gap: 11 (ref 3–11)
BUN: 13 mg/dL (ref 7–26)
CHLORIDE: 100 mmol/L (ref 98–109)
CO2: 28 mmol/L (ref 22–29)
Calcium: 10.3 mg/dL (ref 8.4–10.4)
Creatinine: 0.51 mg/dL — ABNORMAL LOW (ref 0.60–1.10)
GFR, Est AFR Am: >60 mL/min (ref >60)
GFR, Estimated: >60 mL/min (ref >60)
GLUCOSE: 97 mg/dL (ref 70–140)
POTASSIUM: 4 mmol/L (ref 3.5–5.1)
SODIUM: 139 mmol/L (ref 136–145)
Total Bilirubin: 0.3 mg/dL (ref 0.2–1.2)
Total Protein: 7.1 g/dL (ref 6.4–8.3)

## 2018-03-19 LAB — SAMPLE TO BLOOD BANK

## 2018-03-19 NOTE — Telephone Encounter (Signed)
Spoke with pt's sister regarding the pt's Hb, pt's sister states that they are working with Shauna Hugh, RN on setting up a transfusion appointment.

## 2018-03-19 NOTE — Telephone Encounter (Addendum)
Scheduled appt per 5/29 sch message - per Rn melanie okay to add - per Rn diane okay to split two units in to two days., gave patient calender.   I instructed relative to take pt to ED if symptoms worsen .

## 2018-03-19 NOTE — Telephone Encounter (Signed)
-----   Message from Maryanna Shape, NP sent at 03/19/2018  4:05 PM EDT ----- Please call pt/sister. Pt's Hgb is 8.2.  Please see how she is feeling.  If she is getting more tired or more short of breath with minimal exertion, we can consider giving her a blood transfusion.  Let me know how they would like to proceed.  If she wants a transfusion we will need to get her set up for a type and screen and the transfusion will likely be either Friday or Saturday.  Thanks

## 2018-03-19 NOTE — Telephone Encounter (Signed)
Pt presents for lab and completes "walk in form"  for high pulse in 120's and SOB. Hgb 8.3 -Labs reviewed with [facility] and blood transfusion ordered for this week. Schedule request sent .

## 2018-03-20 ENCOUNTER — Telehealth: Payer: Self-pay | Admitting: *Deleted

## 2018-03-20 ENCOUNTER — Telehealth: Payer: Self-pay | Admitting: Medical Oncology

## 2018-03-20 NOTE — Telephone Encounter (Signed)
Stage I decub-in buttock crease. [facility] RN request to start wound protocol for stage I decubitus. Protocol is for NS cleanse follow with hydrocolloidal dressing and change weekly. Also wound care nurse recommends Glucerna I can daily. Orders okay by [facility].

## 2018-03-20 NOTE — Telephone Encounter (Signed)
Ok

## 2018-03-20 NOTE — Telephone Encounter (Signed)
Received call from [place], [place] @ [facility] reporting that pt informed him that she is currently using emergency oxygen tank.  Per Abagail Kitchens, pt was not sure she will have enough oxygen for her to come in for blood transfusion appt at the cancer center.  Trent requested Dr. Worthy Flank nurse to either contact pt or [facility] for further instructions. Called Trent back and left message for him to call this triage nurse back.  Diane, Engineer, technical sales notified. Spoke with Tiara, RN @ [facility], and informed her of above situation.  Tiara stated she would look into this issue, and will make sure pt has enough oxygen to use at home. Tiara's     Phone       [phone]. Trent's     Phone       [phone].

## 2018-03-21 ENCOUNTER — Ambulatory Visit: Payer: Self-pay | Admitting: Pulmonary Disease

## 2018-03-21 ENCOUNTER — Inpatient Hospital Stay: Payer: Medicare HMO

## 2018-03-21 ENCOUNTER — Other Ambulatory Visit: Payer: Self-pay

## 2018-03-21 DIAGNOSIS — D508 Other iron deficiency anemias: Secondary | ICD-10-CM

## 2018-03-21 DIAGNOSIS — D649 Anemia, unspecified: Secondary | ICD-10-CM

## 2018-03-21 DIAGNOSIS — Z5111 Encounter for antineoplastic chemotherapy: Secondary | ICD-10-CM

## 2018-03-21 DIAGNOSIS — Z5112 Encounter for antineoplastic immunotherapy: Secondary | ICD-10-CM | POA: Diagnosis not present

## 2018-03-21 LAB — ABO/RH: ABO/RH(D): O POS

## 2018-03-21 LAB — PREPARE RBC (CROSSMATCH)

## 2018-03-21 MED ORDER — ACETAMINOPHEN 325 MG PO TABS
ORAL_TABLET | ORAL | Status: AC
  Filled 2018-03-21: qty 2

## 2018-03-21 MED ORDER — DIPHENHYDRAMINE HCL 25 MG PO CAPS
ORAL_CAPSULE | ORAL | Status: AC
  Filled 2018-03-21: qty 1

## 2018-03-21 MED ORDER — ACETAMINOPHEN 325 MG PO TABS
650.0000 mg | ORAL_TABLET | Freq: Once | ORAL | Status: AC
  Administered 2018-03-21: 650 mg via ORAL

## 2018-03-21 MED ORDER — DIPHENHYDRAMINE HCL 25 MG PO CAPS
25.0000 mg | ORAL_CAPSULE | Freq: Once | ORAL | Status: AC
  Administered 2018-03-21: 25 mg via ORAL

## 2018-03-21 MED ORDER — SODIUM CHLORIDE 0.9 % IV SOLN
250.0000 mL | Freq: Once | INTRAVENOUS | Status: AC
  Administered 2018-03-21: 250 mL via INTRAVENOUS

## 2018-03-21 NOTE — Patient Instructions (Signed)

## 2018-03-22 ENCOUNTER — Inpatient Hospital Stay: Payer: Medicare HMO | Attending: Internal Medicine

## 2018-03-22 DIAGNOSIS — D649 Anemia, unspecified: Secondary | ICD-10-CM

## 2018-03-22 DIAGNOSIS — C3401 Malignant neoplasm of right main bronchus: Secondary | ICD-10-CM | POA: Insufficient documentation

## 2018-03-22 DIAGNOSIS — C7900 Secondary malignant neoplasm of unspecified kidney and renal pelvis: Secondary | ICD-10-CM | POA: Diagnosis not present

## 2018-03-22 DIAGNOSIS — Z5111 Encounter for antineoplastic chemotherapy: Secondary | ICD-10-CM

## 2018-03-22 DIAGNOSIS — C7931 Secondary malignant neoplasm of brain: Secondary | ICD-10-CM | POA: Insufficient documentation

## 2018-03-22 DIAGNOSIS — Z923 Personal history of irradiation: Secondary | ICD-10-CM | POA: Insufficient documentation

## 2018-03-22 MED ORDER — SODIUM CHLORIDE 0.9 % IV SOLN
Freq: Once | INTRAVENOUS | Status: AC
  Administered 2018-03-22: 11:00:00 via INTRAVENOUS

## 2018-03-22 MED ORDER — ACETAMINOPHEN 325 MG PO TABS
ORAL_TABLET | ORAL | Status: AC
Start: 2018-03-22 — End: ?
  Filled 2018-03-22: qty 2

## 2018-03-22 MED ORDER — SODIUM CHLORIDE 0.9% FLUSH
10.0000 mL | INTRAVENOUS | Status: DC | PRN
  Filled 2018-03-22: qty 10

## 2018-03-22 MED ORDER — DIPHENHYDRAMINE HCL 25 MG PO CAPS
ORAL_CAPSULE | ORAL | Status: AC
  Filled 2018-03-22: qty 1

## 2018-03-22 MED ORDER — ACETAMINOPHEN 325 MG PO TABS
650.0000 mg | ORAL_TABLET | Freq: Once | ORAL | Status: AC
  Administered 2018-03-22: 650 mg via ORAL

## 2018-03-22 MED ORDER — DIPHENHYDRAMINE HCL 25 MG PO CAPS
25.0000 mg | ORAL_CAPSULE | Freq: Once | ORAL | Status: AC
  Administered 2018-03-22: 25 mg via ORAL

## 2018-03-22 NOTE — Patient Instructions (Signed)

## 2018-03-23 ENCOUNTER — Emergency Department (HOSPITAL_COMMUNITY): Payer: Medicare HMO

## 2018-03-23 ENCOUNTER — Other Ambulatory Visit: Payer: Self-pay

## 2018-03-23 ENCOUNTER — Inpatient Hospital Stay (HOSPITAL_COMMUNITY)
Admission: EM | Admit: 2018-03-23 | Discharge: 2018-04-21 | DRG: 193 | Disposition: E | Payer: Medicare HMO | Attending: Internal Medicine | Admitting: Internal Medicine

## 2018-03-23 ENCOUNTER — Encounter (HOSPITAL_COMMUNITY): Payer: Self-pay

## 2018-03-23 DIAGNOSIS — C7901 Secondary malignant neoplasm of right kidney and renal pelvis: Secondary | ICD-10-CM | POA: Diagnosis present

## 2018-03-23 DIAGNOSIS — C3490 Malignant neoplasm of unspecified part of unspecified bronchus or lung: Secondary | ICD-10-CM | POA: Diagnosis not present

## 2018-03-23 DIAGNOSIS — R0902 Hypoxemia: Secondary | ICD-10-CM | POA: Diagnosis not present

## 2018-03-23 DIAGNOSIS — I82492 Acute embolism and thrombosis of other specified deep vein of left lower extremity: Secondary | ICD-10-CM | POA: Diagnosis not present

## 2018-03-23 DIAGNOSIS — Z66 Do not resuscitate: Secondary | ICD-10-CM | POA: Diagnosis present

## 2018-03-23 DIAGNOSIS — Z87891 Personal history of nicotine dependence: Secondary | ICD-10-CM

## 2018-03-23 DIAGNOSIS — Z7901 Long term (current) use of anticoagulants: Secondary | ICD-10-CM | POA: Diagnosis not present

## 2018-03-23 DIAGNOSIS — I10 Essential (primary) hypertension: Secondary | ICD-10-CM | POA: Diagnosis present

## 2018-03-23 DIAGNOSIS — R0603 Acute respiratory distress: Secondary | ICD-10-CM

## 2018-03-23 DIAGNOSIS — Z9981 Dependence on supplemental oxygen: Secondary | ICD-10-CM | POA: Diagnosis not present

## 2018-03-23 DIAGNOSIS — R739 Hyperglycemia, unspecified: Secondary | ICD-10-CM | POA: Diagnosis present

## 2018-03-23 DIAGNOSIS — Z803 Family history of malignant neoplasm of breast: Secondary | ICD-10-CM

## 2018-03-23 DIAGNOSIS — Z801 Family history of malignant neoplasm of trachea, bronchus and lung: Secondary | ICD-10-CM | POA: Diagnosis not present

## 2018-03-23 DIAGNOSIS — J189 Pneumonia, unspecified organism: Secondary | ICD-10-CM | POA: Diagnosis present

## 2018-03-23 DIAGNOSIS — I82409 Acute embolism and thrombosis of unspecified deep veins of unspecified lower extremity: Secondary | ICD-10-CM | POA: Diagnosis present

## 2018-03-23 DIAGNOSIS — J9621 Acute and chronic respiratory failure with hypoxia: Secondary | ICD-10-CM | POA: Diagnosis present

## 2018-03-23 DIAGNOSIS — J811 Chronic pulmonary edema: Secondary | ICD-10-CM | POA: Diagnosis present

## 2018-03-23 DIAGNOSIS — Z7952 Long term (current) use of systemic steroids: Secondary | ICD-10-CM

## 2018-03-23 DIAGNOSIS — Z515 Encounter for palliative care: Secondary | ICD-10-CM | POA: Diagnosis not present

## 2018-03-23 DIAGNOSIS — J9601 Acute respiratory failure with hypoxia: Secondary | ICD-10-CM | POA: Diagnosis not present

## 2018-03-23 DIAGNOSIS — Z800 Family history of malignant neoplasm of digestive organs: Secondary | ICD-10-CM

## 2018-03-23 DIAGNOSIS — Y95 Nosocomial condition: Secondary | ICD-10-CM | POA: Diagnosis present

## 2018-03-23 DIAGNOSIS — J441 Chronic obstructive pulmonary disease with (acute) exacerbation: Secondary | ICD-10-CM | POA: Diagnosis not present

## 2018-03-23 DIAGNOSIS — D630 Anemia in neoplastic disease: Secondary | ICD-10-CM | POA: Diagnosis present

## 2018-03-23 DIAGNOSIS — J440 Chronic obstructive pulmonary disease with acute lower respiratory infection: Secondary | ICD-10-CM | POA: Diagnosis present

## 2018-03-23 DIAGNOSIS — C7931 Secondary malignant neoplasm of brain: Secondary | ICD-10-CM | POA: Diagnosis present

## 2018-03-23 DIAGNOSIS — C3491 Malignant neoplasm of unspecified part of right bronchus or lung: Secondary | ICD-10-CM | POA: Diagnosis present

## 2018-03-23 LAB — BASIC METABOLIC PANEL
Anion gap: 14 (ref 5–15)
BUN: 11 mg/dL (ref 6–20)
CALCIUM: 9.5 mg/dL (ref 8.9–10.3)
CHLORIDE: 98 mmol/L — AB (ref 101–111)
CO2: 25 mmol/L (ref 22–32)
CREATININE: 0.51 mg/dL (ref 0.44–1.00)
GFR calc non Af Amer: >60 mL/min (ref >60)
Glucose, Bld: 192 mg/dL — ABNORMAL HIGH (ref 65–99)
Potassium: 3.3 mmol/L — ABNORMAL LOW (ref 3.5–5.1)
SODIUM: 137 mmol/L (ref 135–145)

## 2018-03-23 LAB — CBC
HCT: 36.2 % (ref 36.0–46.0)
HEMOGLOBIN: 12 g/dL (ref 12.0–15.0)
MCH: 29.9 pg (ref 26.0–34.0)
MCHC: 33.1 g/dL (ref 30.0–36.0)
MCV: 90 fL (ref 78.0–100.0)
Platelets: 314 10*3/uL (ref 150–400)
RBC: 4.02 MIL/uL (ref 3.87–5.11)
RDW: 17.8 % — AB (ref 11.5–15.5)
WBC: 2.7 10*3/uL — ABNORMAL LOW (ref 4.0–10.5)

## 2018-03-23 LAB — I-STAT CG4 LACTIC ACID, ED: Lactic Acid, Venous: 1.7 mmol/L (ref 0.5–1.9)

## 2018-03-23 MED ORDER — VANCOMYCIN HCL IN DEXTROSE 1-5 GM/200ML-% IV SOLN
1000.0000 mg | Freq: Once | INTRAVENOUS | Status: AC
  Administered 2018-03-23: 1000 mg via INTRAVENOUS
  Filled 2018-03-23: qty 200

## 2018-03-23 MED ORDER — ALBUTEROL SULFATE (2.5 MG/3ML) 0.083% IN NEBU
5.0000 mg | INHALATION_SOLUTION | Freq: Once | RESPIRATORY_TRACT | Status: AC
  Administered 2018-03-23: 5 mg via RESPIRATORY_TRACT
  Filled 2018-03-23: qty 6

## 2018-03-23 MED ORDER — SODIUM CHLORIDE 0.9 % IV SOLN
2.0000 g | Freq: Once | INTRAVENOUS | Status: AC
  Administered 2018-03-23: 2 g via INTRAVENOUS
  Filled 2018-03-23: qty 2

## 2018-03-23 MED ORDER — METHYLPREDNISOLONE SODIUM SUCC 125 MG IJ SOLR
125.0000 mg | Freq: Once | INTRAMUSCULAR | Status: AC
  Administered 2018-03-23: 125 mg via INTRAVENOUS
  Filled 2018-03-23: qty 2

## 2018-03-23 MED ORDER — POTASSIUM CHLORIDE CRYS ER 20 MEQ PO TBCR
40.0000 meq | EXTENDED_RELEASE_TABLET | Freq: Once | ORAL | Status: AC
  Administered 2018-03-24: 40 meq via ORAL
  Filled 2018-03-23: qty 2

## 2018-03-23 MED ORDER — IPRATROPIUM BROMIDE 0.02 % IN SOLN
0.5000 mg | Freq: Once | RESPIRATORY_TRACT | Status: AC
  Administered 2018-03-23: 0.5 mg via RESPIRATORY_TRACT
  Filled 2018-03-23: qty 2.5

## 2018-03-23 MED ORDER — SODIUM CHLORIDE 0.9 % IV SOLN
INTRAVENOUS | Status: DC
  Administered 2018-03-23: 20 mL/h via INTRAVENOUS
  Administered 2018-03-24: 22:00:00 via INTRAVENOUS
  Administered 2018-03-24: 10 mL/h via INTRAVENOUS

## 2018-03-23 NOTE — ED Provider Notes (Addendum)
[facility] DEPT Provider Note   CSN: [id number] Arrival date & time: 04/17/2018  1957     History   Chief Complaint Chief Complaint  Patient presents with  . Shortness of Breath    HPI Beth Alexander is a 67 y.o. female.  Patient with hx advanced lung cancer, copd, presents with sob, increased wob, worse in past 1-2 days. Symptoms constant, persistent, severe, worse today. +increased cough. No fever. Denies chest pain or discomfort. No leg pain or swelling. States compliant w normal meds. Last had chemo a few weeks ago. Uses home o2 4 liters continuously.   The history is provided by the patient.    Past Medical History:  Diagnosis Date  . Brain metastasis ([place])   . Hypertension   . lung ca dx'd 11/2017   xrt comp; chemo ongoing  . 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 Tobacco use     Patient Active Problem List   Diagnosis Date Noted  . Hypotension 02/27/2018  . Hyponatremia 02/27/2018  . Acute DVT (deep venous thrombosis) ([place]) 02/25/2018  . Palliative care by specialist   . Malnutrition of moderate degree 02/20/2018  . Acute respiratory failure with hypoxia ([place]) 02/19/2018  . Pressure injury of skin 02/16/2018  . Sepsis ([place]) 02/12/2018  . Pneumonia of left lower lobe due to infectious organism ([place]) 02/12/2018  . Protein calorie malnutrition ([place]) 01/29/2018  . Candidiasis 01/29/2018  . Brain metastasis ([place]) 01/20/2018  . Malignant neoplasm of bronchus of right upper lobe ([place]) 01/20/2018  . Encounter for antineoplastic chemotherapy 01/15/2018  . Encounter for antineoplastic immunotherapy 01/15/2018  . Goals of care, counseling/discussion 12/31/2017  . Squamous cell carcinoma of right lung ([place]) 12/18/2017  . Hypokalemia 12/10/2017  . Essential hypertension   . Tobacco use   . 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 BREAST LUMPECTOMY WITH RADIOACTIVE SEED  LOCALIZATION Left 11/08/2016   Procedure: LEFT BREAST LUMPECTOMY WITH RADIOACTIVE SEED LOCALIZATION;  Surgeon: Donnie [facility], MD;  Location: [facility];  Service: General;  Laterality: Left;  Marland Kitchen VIDEO BRONCHOSCOPY WITH ENDOBRONCHIAL ULTRASOUND N/A 12/11/2017   Procedure: VIDEO BRONCHOSCOPY WITH ENDOBRONCHIAL ULTRASOUND;  Surgeon: Marshell Garfinkel, MD;  Location: [facility];  Service: Pulmonary;  Laterality: N/A;     OB History    Gravida      Para      Term      Preterm      AB      Living  2     SAB      TAB      Ectopic      Multiple      Live Births               Home Medications    Prior to Admission medications   Medication Sig Start Date End Date Taking? Authorizing Provider  acetaminophen (TYLENOL) 325 MG tablet Take 2 tablets (650 mg total) by mouth every 6 (six) hours as needed for mild pain (or Fever >/= 101). 02/25/18   Debbe Odea, MD  arformoterol (BROVANA) 15 MCG/2ML NEBU Take 2 mLs (15 mcg total) by nebulization 2 (two) times daily. 02/26/18   Sheikh, Omair Latif, DO  benzonatate (TESSALON) 100 MG capsule Take 1 capsule (100 mg total) by mouth 3 (three) times daily as needed for cough. 02/25/18   Debbe Odea, MD  dexamethasone (DECADRON) 4 MG tablet Take 1 tablet (4 mg  total) by mouth 2 (two) times daily with a meal. 03/04/18   Hayden Pedro, PA-C  enoxaparin (LOVENOX) 60 MG/0.6ML injection Inject 0.6 mLs (60 mg total) into the skin every 12 (twelve) hours. 02/26/18   Raiford Noble Latif, DO  feeding supplement, ENSURE ENLIVE, (ENSURE ENLIVE) LIQD Take 237 mLs by mouth 2 (two) times daily between meals. 02/26/18   Raiford Noble Latif, DO  guaiFENesin (MUCINEX) 600 MG 12 hr tablet Take 2 tablets (1,200 mg total) by mouth 2 (two) times daily. 02/26/18   Raiford Noble Latif, DO  Multiple Vitamin (MULTIVITAMIN WITH MINERALS) TABS tablet Take 1 tablet by mouth daily. 02/26/18   Raiford Noble Latif, DO  sertraline (ZOLOFT) 50 MG tablet Take 1/2 tab daily  for 1 week and than full tab daily 01/30/18   Arfeen, Arlyce Harman, MD  sodium chloride (OCEAN) 0.65 % SOLN nasal spray Place 1 spray into both nostrils as needed for congestion. 02/26/18   Kerney Elbe, DO    Family History Family History  Problem Relation Age of Onset  . Breast cancer Mother   . Heart disease Father   . Lung cancer Brother   . Colon cancer Brother     Social History Social History   Tobacco Use  . Smoking status: Former Smoker    Packs/day: 0.50    Years: 15.00    Pack years: 7.50    Last attempt to quit: 12/10/2017    Years since quitting: 0.2  . Smokeless tobacco: Never Used  . Tobacco comment: quit smoking 12/10/17--12/18/17  Substance Use Topics  . Alcohol use: No  . Drug use: No     Allergies   Patient has no known allergies.   Review of Systems Review of Systems  Constitutional: Negative for fever.  HENT: Negative for sore throat.   Eyes: Negative for redness.  Respiratory: Positive for cough and shortness of breath.   Cardiovascular: Negative for chest pain.  Gastrointestinal: Negative for abdominal pain and vomiting.  Genitourinary: Negative for flank pain.  Musculoskeletal: Negative for neck pain.  Skin: Negative for rash.  Neurological: Negative for headaches.  Hematological: Does not bruise/bleed easily.  Psychiatric/Behavioral: Negative for confusion.     Physical Exam Updated Vital Signs BP 121/80 (BP Location: Right Arm)   Pulse (!) 140   Temp 98.2 F (36.8 C) (Oral)   Resp (!) 37   Ht 1.6 m (5\' 3" )   Wt 52.6 kg (116 lb)   SpO2 (!) 76%   BMI 20.55 kg/m   Physical Exam  Constitutional:  Dyspneic. Frail appearing.   HENT:  Head: Atraumatic.  Eyes: Conjunctivae are normal. No scleral icterus.  Neck: Neck supple. No JVD present. No tracheal deviation present. No thyromegaly present.  Cardiovascular: Regular rhythm, normal heart sounds and intact distal pulses. Exam reveals no gallop and no friction rub.  No murmur  heard. Tachycardic.   Pulmonary/Chest: She is in respiratory distress. She has wheezes. She has rales.  Abdominal: Soft. Normal appearance and bowel sounds are normal. She exhibits no distension. There is no tenderness.  Genitourinary:  Genitourinary Comments: No cva tenderness  Musculoskeletal: She exhibits no edema or tenderness.  Neurological: She is alert.  Skin: Skin is warm and dry. No rash noted.  Psychiatric: She has a normal mood and affect.  Nursing note and vitals reviewed.    ED Treatments / Results  Labs (all labs ordered are listed, but only abnormal results are displayed) Results for orders placed or performed during  the hospital encounter of 04/04/2018  CBC  Result Value Ref Range   WBC 2.7 (L) 4.0 - 10.5 K/uL   RBC 4.02 3.87 - 5.11 MIL/uL   Hemoglobin 12.0 12.0 - 15.0 g/dL   HCT 36.2 36.0 - 46.0 %   MCV 90.0 78.0 - 100.0 fL   MCH 29.9 26.0 - 34.0 pg   MCHC 33.1 30.0 - 36.0 g/dL   RDW 17.8 (H) 11.5 - 15.5 %   Platelets 314 150 - 400 K/uL  Basic metabolic panel  Result Value Ref Range   Sodium 137 135 - 145 mmol/L   Potassium 3.3 (L) 3.5 - 5.1 mmol/L   Chloride 98 (L) 101 - 111 mmol/L   CO2 25 22 - 32 mmol/L   Glucose, Bld 192 (H) 65 - 99 mg/dL   BUN 11 6 - 20 mg/dL   Creatinine, Ser 0.51 0.44 - 1.00 mg/dL   Calcium 9.5 8.9 - 10.3 mg/dL   GFR calc non Af Amer >60 >60 mL/min   GFR calc Af Amer >60 >60 mL/min   Anion gap 14 5 - 15  I-Stat CG4 Lactic Acid, ED  (not at  [facility])  Result Value Ref Range   Lactic Acid, Venous 1.70 0.5 - 1.9 mmol/L   Dg Chest 2 View  Result Date: 02/26/2018 CLINICAL DATA:  Sepsis, pneumonia, fatigue. EXAM: CHEST - 2 VIEW COMPARISON:  02/19/2018. FINDINGS: Stable exam, with BILATERAL pulmonary opacities, and RIGHT hilar mass. RIGHT hemidiaphragm elevation. No significant effusion or pneumothorax. Unchanged cardiac silhouette. No acute osseous findings. IMPRESSION: Stable chest. Electronically Signed   By: Staci Righter M.D.   On:  02/26/2018 23:08   Dg Chest Port 1 View  Result Date: 04/19/2018 CLINICAL DATA:  Shortness of breath. Hypoxia. History of lung cancer. EXAM: PORTABLE CHEST 1 VIEW COMPARISON:  02/26/2018 FINDINGS: The cardiac silhouette is grossly normal in size. Abnormal density in the right hilar region corresponds to a known mass. There is persistent elevation of the right hemidiaphragm with evidence of increased right lung volume loss. Patchy and coarse opacities are present throughout the right lung and have particularly worsened in the lung base. Coarse opacity is also present in the left lung base in appears increased, while left midlung opacity on the prior study has largely resolved. A small right pleural effusion is not excluded. No pneumothorax is identified. No acute osseous abnormality is seen. IMPRESSION: 1. Right lung volume loss with increasing patchy and coarse opacity throughout much of the right lung concerning for pneumonia. 2. Increasing left basilar infiltrate with largely resolved infiltrate in the left midlung. Electronically Signed   By: Logan Bores M.D.   On: 03/31/2018 21:03    EKG EKG Interpretation  Date/Time:  Sunday March 23 2018 20:40:20 EDT Ventricular Rate:  133 PR Interval:    QRS Duration: 88 QT Interval:  277 QTC Calculation: 412 R Axis:   57 Text Interpretation:  Sinus tachycardia Confirmed by Lajean Saver [id number]) on 03/30/2018 9:49:42 PM   Radiology Dg Chest Port 1 View  Result Date: 04/07/2018 CLINICAL DATA:  Shortness of breath. Hypoxia. History of lung cancer. EXAM: PORTABLE CHEST 1 VIEW COMPARISON:  02/26/2018 FINDINGS: The cardiac silhouette is grossly normal in size. Abnormal density in the right hilar region corresponds to a known mass. There is persistent elevation of the right hemidiaphragm with evidence of increased right lung volume loss. Patchy and coarse opacities are present throughout the right lung and have particularly worsened in the lung base. Coarse  opacity is also present in the left lung base in appears increased, while left midlung opacity on the prior study has largely resolved. A small right pleural effusion is not excluded. No pneumothorax is identified. No acute osseous abnormality is seen. IMPRESSION: 1. Right lung volume loss with increasing patchy and coarse opacity throughout much of the right lung concerning for pneumonia. 2. Increasing left basilar infiltrate with largely resolved infiltrate in the left midlung. Electronically Signed   By: Logan Bores M.D.   On: 03/24/2018 21:03    Procedures Procedures (including critical care time)  Medications Ordered in ED Medications  0.9 %  sodium chloride infusion (has no administration in time range)  methylPREDNISolone sodium succinate (SOLU-MEDROL) 125 mg/2 mL injection 125 mg (has no administration in time range)  albuterol (PROVENTIL) (2.5 MG/3ML) 0.083% nebulizer solution 5 mg (has no administration in time range)  ipratropium (ATROVENT) nebulizer solution 0.5 mg (has no administration in time range)     Initial Impression / Assessment and Plan / ED Course  I have reviewed the triage vital signs and the nursing notes.  Pertinent labs & imaging results that were available during my care of the patient were reviewed by me and considered in my medical decision making (see chart for details).  Iv ns. Continuous pulse ox and monitor. o2 100% mask applied. resp therapy consulted.    Albuterol and atrovent neb. Solumedrol iv. Stat pcxr and labs. Ecg.   Reviewed nursing notes and prior charts for additional history.  On review chart - it appears pt with advanced stage IV lung ca - prior discussions about palliative care/hospice.  I do feel pt would benefit from palliative medicine consult during admission.   Repeat exam, persistent wheezing/rhonchi, and resp distress.   Cxr reviewed - pt with worsening pna vs progression of disease. Cultures sent. Will tx w iv abx.  Iv zosyn and  vanc for possible healthcare acquired pna.   Additional albuterol nebulizations.   Labs reviewed - k low, kcl po.   Given resp distress, tachycardia, suspected pna  -  Will admit.   CRITICAL CARE  RE: acute on chronic respiratory failure, stage IV lung ca, hcap, tachycardia/tachypnea. Performed by: Mirna Mires Total critical care time: 35 minutes Critical care time was exclusive of separately billable procedures and treating other patients. Critical care was necessary to treat or prevent imminent or life-threatening deterioration. Critical care was time spent personally by me on the following activities: development of treatment plan with patient and/or surrogate as well as nursing, discussions with consultants, evaluation of patient's response to treatment, examination of patient, obtaining history from patient or surrogate, ordering and performing treatments and interventions, ordering and review of laboratory studies, ordering and review of radiographic studies, pulse oximetry and re-evaluation of patient's condition.   Final Clinical Impressions(s) / ED Diagnoses   Final diagnoses:  None    ED Discharge Orders    None           Lajean Saver, MD 04/20/2018 2150

## 2018-03-23 NOTE — ED Triage Notes (Signed)
Pt arrived from home with increasing shortness of breath, per family pt has been short of breath since last night. Pt wears 4L O2 at home, sating 70% upon arrival. Pt has a hx of brain and lung cancer, receives injections daily for a blood clot in her leg. Family reports receiving a blood transfusion on Friday and Saturday.

## 2018-03-23 NOTE — ED Notes (Signed)
EKG given to EDP,Steinl,MD., for review.

## 2018-03-23 NOTE — ED Notes (Addendum)
ED TO INPATIENT HANDOFF REPORT  Name/Age/Gender Beth Alexander 67 y.o. female  Code Status    Code Status Orders  (From admission, onward)        Start     Ordered   03/29/2018 2215  Do not attempt resuscitation/DNR  Continuous    Question Answer Comment  In the event of cardiac or respiratory ARREST Do not call a "code blue"   In the event of cardiac or respiratory ARREST Do not perform Intubation, CPR, defibrillation or ACLS   In the event of cardiac or respiratory ARREST Use medication by any route, position, wound care, and other measures to relive pain and suffering. May use oxygen, suction and manual treatment of airway obstruction as needed for comfort.      04/04/2018 2214    Code Status History    Date Active Date Inactive Code Status Order ID Comments User Context   02/27/2018 0341 02/28/2018 1901 DNR [id number]  Cristy Folks, MD Inpatient   02/20/2018 1522 02/26/2018 1755 DNR [id number]  Debbe Odea, MD Inpatient   02/12/2018 2041 02/20/2018 1522 Full Code [id number]  Rise Patience, MD ED   12/10/2017 0254 12/12/2017 1702 Full Code [id number]  Ivor Costa, MD ED      Home/SNF/Other Home  Chief Complaint Shortness of Breath/ [place] Patient   Level of Care/Admitting Diagnosis ED Disposition    ED Disposition Condition Comment   [facility] Area: [facility] [[id number]]  Level of Care: Telemetry [5]  Admit to tele based on following criteria: Monitor for Ischemic changes  Diagnosis: Acute respiratory failure with hypoxia [facility]) [[id number]]  Admitting Physician: Rise Patience [phone]  Attending Physician: Rise Patience [phone]  Estimated length of stay: past midnight tomorrow  Certification:: I certify this patient will need inpatient services for at least 2 midnights  PT Class (Do Not Modify): Inpatient [101]  PT Acc Code (Do Not Modify): Private [1]       Medical History Past Medical History:  Diagnosis Date  . Brain metastasis  ([place])   . Hypertension   . lung ca dx'd 11/2017   xrt comp; chemo ongoing  . 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 Tobacco use     Allergies No Known Allergies  IV Location/Drains/Wounds Patient Lines/Drains/Airways Status   Active Line/Drains/Airways    Name:   Placement date:   Placement time:   Site:   Days:   Peripheral IV 02/26/18 Right Forearm   02/26/18    2233    Forearm   25   External Urinary Catheter   02/14/18    2100    -   37   Pressure Injury 02/13/18 Stage II -  Partial thickness loss of dermis presenting as a shallow open ulcer with a red, pink wound bed without slough. several small circular areas of pressure injury over buttocks    02/13/18    0800     38   Pressure Injury 02/27/18 Stage II -  Partial thickness loss of dermis presenting as a shallow open ulcer with a red, pink wound bed without slough. 2 small pressure injury on sacrum   02/27/18    0411     24          Labs/Imaging Results for orders placed or performed during the hospital encounter of 03/30/2018 (from the past 48 hour(s))  CBC     Status: Abnormal   Collection Time: 04/09/2018  8:35 PM  Result Value Ref  Range   WBC 2.7 (L) 4.0 - 10.5 K/uL   RBC 4.02 3.87 - 5.11 MIL/uL   Hemoglobin 12.0 12.0 - 15.0 g/dL   HCT 36.2 36.0 - 46.0 %   MCV 90.0 78.0 - 100.0 fL   MCH 29.9 26.0 - 34.0 pg   MCHC 33.1 30.0 - 36.0 g/dL   RDW 17.8 (H) 11.5 - 15.5 %   Platelets 314 150 - 400 K/uL    Comment: Performed at [facility], [place] [address], [place], [place] [address]  Basic metabolic panel     Status: Abnormal   Collection Time: 04/01/2018  8:35 PM  Result Value Ref Range   Sodium 137 135 - 145 mmol/L   Potassium 3.3 (L) 3.5 - 5.1 mmol/L   Chloride 98 (L) 101 - 111 mmol/L   CO2 25 22 - 32 mmol/L   Glucose, Bld 192 (H) 65 - 99 mg/dL   BUN 11 6 - 20 mg/dL   Creatinine, Ser 0.51 0.44 - 1.00 mg/dL   Calcium 9.5 8.9 - 10.3 mg/dL   GFR calc non Af Amer >60 >60 mL/min   GFR calc Af Amer >60 >60 mL/min     Comment: (NOTE) The eGFR has been calculated using the CKD EPI equation. This calculation has not been validated in all clinical situations. eGFR's persistently <60 mL/min signify possible Chronic Kidney Disease.    Anion gap 14 5 - 15    Comment: Performed at [facility], [place] [address] [place], [place] [address]  I-Stat CG4 Lactic Acid, ED  (not at  [facility])     Status: None   Collection Time: 03/22/2018  9:38 PM  Result Value Ref Range   Lactic Acid, Venous 1.70 0.5 - 1.9 mmol/L   Dg Chest Port 1 View  Result Date: 04/09/2018 CLINICAL DATA:  Shortness of breath. Hypoxia. History of lung cancer. EXAM: PORTABLE CHEST 1 VIEW COMPARISON:  02/26/2018 FINDINGS: The cardiac silhouette is grossly normal in size. Abnormal density in the right hilar region corresponds to a known mass. There is persistent elevation of the right hemidiaphragm with evidence of increased right lung volume loss. Patchy and coarse opacities are present throughout the right lung and have particularly worsened in the lung base. Coarse opacity is also present in the left lung base in appears increased, while left midlung opacity on the prior study has largely resolved. A small right pleural effusion is not excluded. No pneumothorax is identified. No acute osseous abnormality is seen. IMPRESSION: 1. Right lung volume loss with increasing patchy and coarse opacity throughout much of the right lung concerning for pneumonia. 2. Increasing left basilar infiltrate with largely resolved infiltrate in the left midlung. Electronically Signed   By: Logan Bores M.D.   On: 04/12/2018 21:03    Pending Labs Unresulted Labs (From admission, onward)   Start     Ordered   04/18/2018 2110  Blood Culture (routine x 2)  BLOOD CULTURE X 2,   STAT     03/28/2018 2109      Vitals/Pain Today's Vitals   04/10/2018 2214 04/20/2018 2244 03/22/2018 2300 04/17/2018 2330  BP: 102/77 124/68 118/82 111/71  Pulse: (!) 126 (!) 116    Resp: (!)  29 (!) 27 (!) 32 (!) 36  Temp:      TempSrc:      SpO2: (!) 89% 96% 97% 96%  Weight:      Height:      PainSc:  Isolation Precautions No active isolations  Medications Medications  0.9 %  sodium chloride infusion (20 mL/hr Intravenous New Bag/Given 04/11/2018 2040)  vancomycin (VANCOCIN) IVPB 1000 mg/200 mL premix (1,000 mg Intravenous New Bag/Given 03/31/2018 2325)  potassium chloride SA (K-DUR,KLOR-CON) CR tablet 40 mEq (has no administration in time range)  methylPREDNISolone sodium succinate (SOLU-MEDROL) 125 mg/2 mL injection 125 mg (125 mg Intravenous Given 04/18/2018 2036)  albuterol (PROVENTIL) (2.5 MG/3ML) 0.083% nebulizer solution 5 mg (5 mg Nebulization Given 04/12/2018 2052)  ipratropium (ATROVENT) nebulizer solution 0.5 mg (0.5 mg Nebulization Given 04/01/2018 2052)  ceFEPIme (MAXIPIME) 2 g in sodium chloride 0.9 % 100 mL IVPB (0 g Intravenous Stopped 04/20/2018 2243)  albuterol (PROVENTIL) (2.5 MG/3ML) 0.083% nebulizer solution 5 mg (5 mg Nebulization Given 04/14/2018 2314)  ipratropium (ATROVENT) nebulizer solution 0.5 mg (0.5 mg Nebulization Given 03/30/2018 2314)    Mobility Non-ambulatory

## 2018-03-23 NOTE — Progress Notes (Signed)
A consult was received from an ED physician for Vancomycin and Cefepime per pharmacy dosing.  The patient's profile has been reviewed for ht/wt/allergies/indication/available labs.   A one time order has been placed for Vancomycin 1000mg  IV and Cefepime 2gm IV.  Further antibiotics/pharmacy consults should be ordered by admitting physician if indicated.                       Thank you, Everette Rank, PharmD 04/03/2018  9:13 PM

## 2018-03-24 ENCOUNTER — Encounter (HOSPITAL_COMMUNITY): Payer: Self-pay | Admitting: Radiology

## 2018-03-24 ENCOUNTER — Other Ambulatory Visit: Payer: Self-pay

## 2018-03-24 ENCOUNTER — Inpatient Hospital Stay (HOSPITAL_COMMUNITY): Payer: Medicare HMO

## 2018-03-24 LAB — TYPE AND SCREEN
ABO/RH(D): O POS
Antibody Screen: NEGATIVE
Unit division: 0
Unit division: 0

## 2018-03-24 LAB — BASIC METABOLIC PANEL
Anion gap: 12 (ref 5–15)
BUN: 8 mg/dL (ref 6–20)
CALCIUM: 8.9 mg/dL (ref 8.9–10.3)
CO2: 21 mmol/L — ABNORMAL LOW (ref 22–32)
CREATININE: 0.4 mg/dL — AB (ref 0.44–1.00)
Chloride: 103 mmol/L (ref 101–111)
Glucose, Bld: 196 mg/dL — ABNORMAL HIGH (ref 65–99)
Potassium: 3.7 mmol/L (ref 3.5–5.1)
SODIUM: 136 mmol/L (ref 135–145)

## 2018-03-24 LAB — CBC
HCT: 34.4 % — ABNORMAL LOW (ref 36.0–46.0)
Hemoglobin: 11.2 g/dL — ABNORMAL LOW (ref 12.0–15.0)
MCH: 29.3 pg (ref 26.0–34.0)
MCHC: 32.6 g/dL (ref 30.0–36.0)
MCV: 90.1 fL (ref 78.0–100.0)
PLATELETS: 317 10*3/uL (ref 150–400)
RBC: 3.82 MIL/uL — AB (ref 3.87–5.11)
RDW: 17.9 % — ABNORMAL HIGH (ref 11.5–15.5)
WBC: 2.6 10*3/uL — AB (ref 4.0–10.5)

## 2018-03-24 LAB — BPAM RBC
Blood Product Expiration Date: 201906262359
Blood Product Expiration Date: 201906262359
ISSUE DATE / TIME: 201905311414
ISSUE DATE / TIME: 201906011128
Unit Type and Rh: 5100
Unit Type and Rh: 5100

## 2018-03-24 LAB — MRSA PCR SCREENING: MRSA BY PCR: NEGATIVE

## 2018-03-24 MED ORDER — ENOXAPARIN SODIUM 60 MG/0.6ML ~~LOC~~ SOLN
55.0000 mg | Freq: Two times a day (BID) | SUBCUTANEOUS | Status: DC
  Administered 2018-03-24 (×2): 55 mg via SUBCUTANEOUS
  Filled 2018-03-24 (×3): qty 0.55

## 2018-03-24 MED ORDER — ADULT MULTIVITAMIN W/MINERALS CH
1.0000 | ORAL_TABLET | Freq: Every day | ORAL | Status: DC
  Administered 2018-03-24: 1 via ORAL
  Filled 2018-03-24: qty 1

## 2018-03-24 MED ORDER — SERTRALINE HCL 50 MG PO TABS
50.0000 mg | ORAL_TABLET | Freq: Every day | ORAL | Status: DC
  Administered 2018-03-24: 50 mg via ORAL
  Filled 2018-03-24: qty 1

## 2018-03-24 MED ORDER — IOPAMIDOL (ISOVUE-370) INJECTION 76%
INTRAVENOUS | Status: AC
  Filled 2018-03-24: qty 100

## 2018-03-24 MED ORDER — ENSURE ENLIVE PO LIQD
237.0000 mL | Freq: Two times a day (BID) | ORAL | Status: DC
  Administered 2018-03-24 (×2): 237 mL via ORAL

## 2018-03-24 MED ORDER — GUAIFENESIN ER 600 MG PO TB12
1200.0000 mg | ORAL_TABLET | Freq: Two times a day (BID) | ORAL | Status: DC
  Administered 2018-03-24 (×3): 1200 mg via ORAL
  Filled 2018-03-24 (×3): qty 2

## 2018-03-24 MED ORDER — LORAZEPAM 2 MG/ML IJ SOLN
0.5000 mg | Freq: Once | INTRAMUSCULAR | Status: AC
  Administered 2018-03-24: 0.5 mg via INTRAVENOUS
  Filled 2018-03-24: qty 1

## 2018-03-24 MED ORDER — ACETAMINOPHEN 650 MG RE SUPP: 650.0000 mg | Freq: Four times a day (QID) | RECTAL | Status: DC | PRN

## 2018-03-24 MED ORDER — LEVALBUTEROL HCL 0.63 MG/3ML IN NEBU
0.6300 mg | INHALATION_SOLUTION | Freq: Four times a day (QID) | RESPIRATORY_TRACT | Status: DC | PRN
  Administered 2018-03-24: 0.63 mg via RESPIRATORY_TRACT

## 2018-03-24 MED ORDER — MORPHINE SULFATE (PF) 2 MG/ML IV SOLN
2.0000 mg | Freq: Once | INTRAVENOUS | Status: AC
  Administered 2018-03-24: 2 mg via INTRAVENOUS
  Filled 2018-03-24: qty 1

## 2018-03-24 MED ORDER — DEXAMETHASONE 2 MG PO TABS
4.0000 mg | ORAL_TABLET | Freq: Two times a day (BID) | ORAL | Status: DC
  Administered 2018-03-24 (×2): 4 mg via ORAL
  Filled 2018-03-24 (×2): qty 2

## 2018-03-24 MED ORDER — ONDANSETRON HCL 4 MG/2ML IJ SOLN
4.0000 mg | Freq: Four times a day (QID) | INTRAMUSCULAR | Status: DC | PRN
  Administered 2018-03-25: 4 mg via INTRAVENOUS
  Filled 2018-03-24: qty 2

## 2018-03-24 MED ORDER — LEVALBUTEROL HCL 1.25 MG/0.5ML IN NEBU
1.2500 mg | INHALATION_SOLUTION | Freq: Once | RESPIRATORY_TRACT | Status: AC
  Administered 2018-03-24: 1.25 mg via RESPIRATORY_TRACT
  Filled 2018-03-24: qty 0.5

## 2018-03-24 MED ORDER — ARFORMOTEROL TARTRATE 15 MCG/2ML IN NEBU
15.0000 ug | INHALATION_SOLUTION | Freq: Two times a day (BID) | RESPIRATORY_TRACT | Status: DC
  Administered 2018-03-24 (×2): 15 ug via RESPIRATORY_TRACT
  Filled 2018-03-24: qty 2

## 2018-03-24 MED ORDER — VANCOMYCIN HCL IN DEXTROSE 750-5 MG/150ML-% IV SOLN
750.0000 mg | INTRAVENOUS | Status: DC
  Filled 2018-03-24: qty 150

## 2018-03-24 MED ORDER — LABETALOL HCL 5 MG/ML IV SOLN: 10.0000 mg | INTRAVENOUS | Status: DC | PRN

## 2018-03-24 MED ORDER — ACETAMINOPHEN 325 MG PO TABS: 650.0000 mg | ORAL_TABLET | Freq: Four times a day (QID) | ORAL | Status: DC | PRN

## 2018-03-24 MED ORDER — ONDANSETRON HCL 4 MG PO TABS: 4.0000 mg | ORAL_TABLET | Freq: Four times a day (QID) | ORAL | Status: DC | PRN

## 2018-03-24 MED ORDER — PIPERACILLIN-TAZOBACTAM 3.375 G IVPB
3.3750 g | Freq: Three times a day (TID) | INTRAVENOUS | Status: DC
  Administered 2018-03-24 – 2018-03-25 (×4): 3.375 g via INTRAVENOUS
  Filled 2018-03-24 (×4): qty 50

## 2018-03-24 MED ORDER — SODIUM CHLORIDE 0.9 % IV BOLUS
500.0000 mL | Freq: Once | INTRAVENOUS | Status: AC
  Administered 2018-03-24: 500 mL via INTRAVENOUS

## 2018-03-24 MED ORDER — ALBUTEROL SULFATE (2.5 MG/3ML) 0.083% IN NEBU
2.5000 mg | INHALATION_SOLUTION | Freq: Two times a day (BID) | RESPIRATORY_TRACT | Status: DC
  Administered 2018-03-24: 2.5 mg via RESPIRATORY_TRACT
  Filled 2018-03-24: qty 3

## 2018-03-24 MED ORDER — ALBUTEROL SULFATE (2.5 MG/3ML) 0.083% IN NEBU: 2.5000 mg | INHALATION_SOLUTION | Freq: Three times a day (TID) | RESPIRATORY_TRACT | Status: DC

## 2018-03-24 MED ORDER — BUDESONIDE 0.25 MG/2ML IN SUSP
0.2500 mg | Freq: Two times a day (BID) | RESPIRATORY_TRACT | Status: DC
  Administered 2018-03-24 (×2): 0.25 mg via RESPIRATORY_TRACT
  Filled 2018-03-24 (×2): qty 2

## 2018-03-24 MED ORDER — IOPAMIDOL (ISOVUE-370) INJECTION 76%
100.0000 mL | Freq: Once | INTRAVENOUS | Status: AC | PRN
  Administered 2018-03-24: 62 mL via INTRAVENOUS

## 2018-03-24 MED ORDER — BENZONATATE 100 MG PO CAPS
100.0000 mg | ORAL_CAPSULE | Freq: Three times a day (TID) | ORAL | Status: DC | PRN
  Administered 2018-03-24: 100 mg via ORAL
  Filled 2018-03-24: qty 1

## 2018-03-24 MED ORDER — ALBUTEROL SULFATE (2.5 MG/3ML) 0.083% IN NEBU
2.5000 mg | INHALATION_SOLUTION | RESPIRATORY_TRACT | Status: DC
  Administered 2018-03-24 (×3): 2.5 mg via RESPIRATORY_TRACT
  Filled 2018-03-24 (×3): qty 3

## 2018-03-24 MED ORDER — ALBUTEROL SULFATE (2.5 MG/3ML) 0.083% IN NEBU: 2.5000 mg | INHALATION_SOLUTION | RESPIRATORY_TRACT | Status: DC | PRN

## 2018-03-24 NOTE — Progress Notes (Signed)
  Radiation Oncology         [phone]) [phone] ________________________________  Name: Beth Alexander MRN: [id number]  Date: 01/17/2018  DOB: May 18, 1951  End of Treatment Note  Diagnosis:   67 y.o. 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 01/06/2018 - 01/17/2018 2. 01/17/2018  Site/dose:    1. Right Lung // 30 Gy in 10 fractions 2. Brain PTV1: Right Thalamus 32mm // 20 Gy in 1 fraction  Beams/energy: 1. 3D // 10X, 6X Photon 2. SBRT/SRT-VMAT // 6X-FFF Photon  Narrative: The patient tolerated radiation treatment well.  No significant difficulties and no signs of acute toxicity after treatment.  Plan: The patient has completed radiation treatment. The patient will return to radiation oncology clinic for routine followup in one month. I advised the patient to call or return sooner if they have any questions or concerns related to their recovery or treatment. ________________________________  Jodelle Gross, MD, PhD  This document serves as a record of services personally performed by Kyung Rudd, MD. It was created on his behalf by Rae Lips, a trained medical scribe. The creation of this record is based on the scribe's personal observations and the provider's statements to them. This document has been checked and approved by the attending provider.

## 2018-03-24 NOTE — Progress Notes (Signed)
PATIENT REQUESTED TO EAT BREAKFAST PRIOR TO HEADING DOWN TO CT.

## 2018-03-24 NOTE — Progress Notes (Signed)
Called Resp Therapy to inform of new order for Xopenex. Therapist states she is aware of new order and will be by shortly.

## 2018-03-24 NOTE — Progress Notes (Signed)
Seen and examined at her bedside. CTA negative for PE. Left LE DVT diagnosed in 01/2018. C/w full dose lovenox.  MRSA negative- On IV zosyn for HCAP.  Please refer to H&P dictated by Dr Hal Hope on 03/24/18 for further details of the assessment and plan.

## 2018-03-24 NOTE — Progress Notes (Signed)
On call paged re: elevated HR 130-140's and BP 87/27. Will apply new BP cuff and BP to be repeated.

## 2018-03-24 NOTE — Progress Notes (Signed)
Pharmacy Antibiotic Note  Beth Alexander is a 67 y.o. female admitted on 03/27/2018 with pneumonia.  Pharmacy has been consulted for zosyn and vancomycin dosing.  Plan: ZEI Vancomycin 1 Gm x1 then 750 mg IV q24h for est AUC =511 Goal AUC = 400-500 Daily scr F/u cultures/levels Lovenox 55 mg sq q12h  Height: 5\' 3"  (160 cm) Weight: 116 lb (52.6 kg) IBW/kg (Calculated) : 52.4  Temp (24hrs), Avg:98.2 F (36.8 C), Min:98.2 F (36.8 C), Max:98.2 F (36.8 C)  Recent Labs  Lab 03/19/18 1523 03/22/2018 2035 03/31/2018 2138  WBC 4.6 2.7*  --   CREATININE 0.51* 0.51  --   LATICACIDVEN  --   --  1.70    Estimated Creatinine Clearance: 58 mL/min (by C-G formula based on SCr of 0.51 mg/dL).    No Known Allergies  Antimicrobials this admission: 6/2 cefepime >> x1 ED 6/3 vancomycin >>  6/3 zosyn >>  Dose adjustments this admission:   Microbiology results:  BCx:   UCx:    Sputum:    MRSA PCR:   Thank you for allowing pharmacy to be a part of this patient's care.  Dorrene German 03/24/2018 12:24 AM

## 2018-03-24 NOTE — ED Notes (Signed)
ED TO INPATIENT HANDOFF REPORT  Name/Age/Gender Beth Alexander 67 y.o. female  Code Status    Code Status Orders  (From admission, onward)        Start     Ordered   03/24/18 0007  Do not attempt resuscitation (DNR)  Continuous    Question Answer Comment  In the event of cardiac or respiratory ARREST Do not call a "code blue"   In the event of cardiac or respiratory ARREST Do not perform Intubation, CPR, defibrillation or ACLS   In the event of cardiac or respiratory ARREST Use medication by any route, position, wound care, and other measures to relive pain and suffering. May use oxygen, suction and manual treatment of airway obstruction as needed for comfort.      03/24/18 0006    Code Status History    Date Active Date Inactive Code Status Order ID Comments User Context   04/17/2018 2214 03/24/2018 0006 DNR [id number]  Lajean Saver, MD ED   02/27/2018 0341 02/28/2018 1901 DNR [id number]  Cristy Folks, MD Inpatient   02/20/2018 1522 02/26/2018 1755 DNR [id number]  Debbe Odea, MD Inpatient   02/12/2018 2041 02/20/2018 1522 Full Code [id number]  Rise Patience, MD ED   12/10/2017 0254 12/12/2017 1702 Full Code [id number]  Ivor Costa, MD ED      Home/SNF/Other Home  Chief Complaint Shortness of Breath/ [place] Patient   Level of Care/Admitting Diagnosis ED Disposition    ED Disposition Condition [place]: [facility] [100102]  Level of Care: Stepdown [14]  Admit to SDU based on following criteria: Respiratory Distress:  Frequent assessment and/or intervention to maintain adequate ventilation/respiration, pulmonary toilet, and respiratory treatment.  Diagnosis: Acute respiratory failure with hypoxia [facility]) [[id number]]  Admitting Physician: Rise Patience [phone]  Attending Physician: Rise Patience [phone]  Estimated length of stay: past midnight tomorrow  Certification:: I certify this patient will need inpatient services for at  least 2 midnights  PT Class (Do Not Modify): Inpatient [101]  PT Acc Code (Do Not Modify): Private [1]       Medical History Past Medical History:  Diagnosis Date  . Brain metastasis ([place])   . Hypertension   . lung ca dx'd 11/2017   xrt comp; chemo ongoing  . Metastasis to kidney ([place])   . Tobacco use     Allergies No Known Allergies  IV Location/Drains/Wounds Patient Lines/Drains/Airways Status   Active Line/Drains/Airways    Name:   Placement date:   Placement time:   Site:   Days:   Peripheral IV 02/26/18 Right Forearm   02/26/18    2233    Forearm   26   External Urinary Catheter   02/14/18    2100    -   38   Pressure Injury 02/13/18 Stage II -  Partial thickness loss of dermis presenting as a shallow open ulcer with a red, pink wound bed without slough. several small circular areas of pressure injury over buttocks    02/13/18    0800     39   Pressure Injury 02/27/18 Stage II -  Partial thickness loss of dermis presenting as a shallow open ulcer with a red, pink wound bed without slough. 2 small pressure injury on sacrum   02/27/18    0411     25          Labs/Imaging Results for orders placed or performed during the hospital encounter of  04/01/2018 (from the past 48 hour(s))  CBC     Status: Abnormal   Collection Time: 04/05/2018  8:35 PM  Result Value Ref Range   WBC 2.7 (L) 4.0 - 10.5 K/uL   RBC 4.02 3.87 - 5.11 MIL/uL   Hemoglobin 12.0 12.0 - 15.0 g/dL   HCT 36.2 36.0 - 46.0 %   MCV 90.0 78.0 - 100.0 fL   MCH 29.9 26.0 - 34.0 pg   MCHC 33.1 30.0 - 36.0 g/dL   RDW 17.8 (H) 11.5 - 15.5 %   Platelets 314 150 - 400 K/uL    Comment: Performed at [facility], [place] [address] [place], [place] [address]  Basic metabolic panel     Status: Abnormal   Collection Time: 03/22/2018  8:35 PM  Result Value Ref Range   Sodium 137 135 - 145 mmol/L   Potassium 3.3 (L) 3.5 - 5.1 mmol/L   Chloride 98 (L) 101 - 111 mmol/L   CO2 25 22 - 32 mmol/L   Glucose, Bld  192 (H) 65 - 99 mg/dL   BUN 11 6 - 20 mg/dL   Creatinine, Ser 0.51 0.44 - 1.00 mg/dL   Calcium 9.5 8.9 - 10.3 mg/dL   GFR calc non Af Amer >60 >60 mL/min   GFR calc Af Amer >60 >60 mL/min    Comment: (NOTE) The eGFR has been calculated using the CKD EPI equation. This calculation has not been validated in all clinical situations. eGFR's persistently <60 mL/min signify possible Chronic Kidney Disease.    Anion gap 14 5 - 15    Comment: Performed at [facility], [place] [address], [place], [place] [address]  I-Stat CG4 Lactic Acid, ED  (not at  [facility])     Status: None   Collection Time: 03/29/2018  9:38 PM  Result Value Ref Range   Lactic Acid, Venous 1.70 0.5 - 1.9 mmol/L   Dg Chest Port 1 View  Result Date: 03/29/2018 CLINICAL DATA:  Shortness of breath. Hypoxia. History of lung cancer. EXAM: PORTABLE CHEST 1 VIEW COMPARISON:  02/26/2018 FINDINGS: The cardiac silhouette is grossly normal in size. Abnormal density in the right hilar region corresponds to a known mass. There is persistent elevation of the right hemidiaphragm with evidence of increased right lung volume loss. Patchy and coarse opacities are present throughout the right lung and have particularly worsened in the lung base. Coarse opacity is also present in the left lung base in appears increased, while left midlung opacity on the prior study has largely resolved. A small right pleural effusion is not excluded. No pneumothorax is identified. No acute osseous abnormality is seen. IMPRESSION: 1. Right lung volume loss with increasing patchy and coarse opacity throughout much of the right lung concerning for pneumonia. 2. Increasing left basilar infiltrate with largely resolved infiltrate in the left midlung. Electronically Signed   By: Logan Bores M.D.   On: 04/19/2018 21:03    Pending Labs Unresulted Labs (From admission, onward)   Start     Ordered   04-05-18 0500  Creatinine, serum  Daily,   R     03/24/18 0028    03/24/18 8875  Basic metabolic panel  Tomorrow morning,   R     03/24/18 0006   03/24/18 0500  CBC  Tomorrow morning,   R     03/24/18 0006   04/20/2018 2110  Blood Culture (routine x 2)  BLOOD CULTURE X 2,   STAT  04/14/2018 2109      Vitals/Pain Today's Vitals   03/29/2018 2300 04/12/2018 2330 03/24/18 0000 03/24/18 0030  BP: 118/82 111/71 101/82 124/74  Pulse:   (!) 118   Resp: (!) 32 (!) 36 (!) 36 (!) 21  Temp:      TempSrc:      SpO2: 97% 96% 94% 93%  Weight:      Height:      PainSc:        Isolation Precautions No active isolations  Medications Medications  0.9 %  sodium chloride infusion (20 mL/hr Intravenous New Bag/Given 04/03/2018 2040)  potassium chloride SA (K-DUR,KLOR-CON) CR tablet 40 mEq (has no administration in time range)  arformoterol (BROVANA) nebulizer solution 15 mcg (15 mcg Nebulization Not Given 03/24/18 0012)  benzonatate (TESSALON) capsule 100 mg (has no administration in time range)  dexamethasone (DECADRON) tablet 4 mg (has no administration in time range)  feeding supplement (ENSURE ENLIVE) (ENSURE ENLIVE) liquid 237 mL (has no administration in time range)  guaiFENesin (MUCINEX) 12 hr tablet 1,200 mg (has no administration in time range)  multivitamin with minerals tablet 1 tablet (has no administration in time range)  sertraline (ZOLOFT) tablet 50 mg (has no administration in time range)  acetaminophen (TYLENOL) tablet 650 mg (has no administration in time range)    Or  acetaminophen (TYLENOL) suppository 650 mg (has no administration in time range)  ondansetron (ZOFRAN) tablet 4 mg (has no administration in time range)    Or  ondansetron (ZOFRAN) injection 4 mg (has no administration in time range)  albuterol (PROVENTIL) (2.5 MG/3ML) 0.083% nebulizer solution 2.5 mg (has no administration in time range)  albuterol (PROVENTIL) (2.5 MG/3ML) 0.083% nebulizer solution 2.5 mg (has no administration in time range)  piperacillin-tazobactam (ZOSYN) IVPB  3.375 g (has no administration in time range)  vancomycin (VANCOCIN) IVPB 750 mg/150 ml premix (has no administration in time range)  enoxaparin (LOVENOX) injection 55 mg (has no administration in time range)  methylPREDNISolone sodium succinate (SOLU-MEDROL) 125 mg/2 mL injection 125 mg (125 mg Intravenous Given 04/04/2018 2036)  albuterol (PROVENTIL) (2.5 MG/3ML) 0.083% nebulizer solution 5 mg (5 mg Nebulization Given 04/18/2018 2052)  ipratropium (ATROVENT) nebulizer solution 0.5 mg (0.5 mg Nebulization Given 04/03/2018 2052)  ceFEPIme (MAXIPIME) 2 g in sodium chloride 0.9 % 100 mL IVPB (0 g Intravenous Stopped 04/09/2018 2243)  vancomycin (VANCOCIN) IVPB 1000 mg/200 mL premix (0 mg Intravenous Stopped 03/24/18 0030)  albuterol (PROVENTIL) (2.5 MG/3ML) 0.083% nebulizer solution 5 mg (5 mg Nebulization Given 03/30/2018 2314)  ipratropium (ATROVENT) nebulizer solution 0.5 mg (0.5 mg Nebulization Given 04/19/2018 2314)    Mobility non-ambulatory

## 2018-03-24 NOTE — Progress Notes (Signed)
Pt continues to have elevated HR and difficulty maintaining O2 sats even following Resp Therapy Nebs. ON CALL paged. Awaiting return call at this time.

## 2018-03-24 NOTE — Progress Notes (Signed)
Patient returned to unit from CT on monitor; reattached to room monitor; VS obtained. No significant distress noted; denies needs.

## 2018-03-24 NOTE — H&P (Addendum)
History and Physical    [name] WEX:[id number] DOB: 1951/01/31 DOA: 03/22/2018  PCP: [name], MD  Patient coming from: Home.  Chief Complaint: Shortness of breath.  HPI: Beth Alexander is a 67 y.o. female with history of stage IV lung cancer, DVT, hypertension off medications, COPD presents to the ER because of worsening shortness of breath over the last 2 days with some productive cough and wheezing.  Denies any chest pain.  Denies nausea vomiting or abdominal pain.  ED Course: In the ER patient was hypoxic requiring almost 100% oxygen.  Patient is able to talk sentences completely.  X-ray shows bilateral infiltrates concerning for pneumonia.  Patient also has wheezing for which patient was placed on nebulizer treatment along with steroids.  Started on empiric antibiotics.  Admitted for acute respiratory failure likely from pneumonia COPD and possible worsening cancer.  Review of Systems: As per HPI, rest all negative.   Past Medical History:  Diagnosis Date  . Brain metastasis ([place])   . Hypertension   . lung ca dx'd 11/2017   xrt comp; chemo ongoing  . Metastasis to kidney ([place])   . Tobacco use     Past Surgical History:  Procedure Laterality Date  . BREAST LUMPECTOMY WITH RADIOACTIVE SEED LOCALIZATION Left 11/08/2016   Procedure: LEFT BREAST LUMPECTOMY WITH RADIOACTIVE SEED LOCALIZATION;  Surgeon: Donnie Mesa, MD;  Location: [facility];  Service: General;  Laterality: Left;  Marland Kitchen VIDEO BRONCHOSCOPY WITH ENDOBRONCHIAL ULTRASOUND N/A 12/11/2017   Procedure: VIDEO BRONCHOSCOPY WITH ENDOBRONCHIAL ULTRASOUND;  Surgeon: Marshell Garfinkel, MD;  Location: [facility];  Service: Pulmonary;  Laterality: N/A;     reports that she quit smoking about 3 months ago. She has a 7.50 pack-year smoking history. She has never used smokeless tobacco. She reports that she does not drink alcohol or use drugs.  No Known Allergies  Family History  Problem Relation Age of Onset  . Breast  cancer Mother   . Heart disease Father   . Lung cancer Brother   . Colon cancer Brother     Prior to Admission medications   Medication Sig Start Date End Date Taking? Authorizing Provider  acetaminophen (TYLENOL) 325 MG tablet Take 2 tablets (650 mg total) by mouth every 6 (six) hours as needed for mild pain (or Fever >/= 101). 02/25/18  Yes Debbe Odea, MD  arformoterol (BROVANA) 15 MCG/2ML NEBU Take 2 mLs (15 mcg total) by nebulization 2 (two) times daily. 02/26/18  Yes Sheikh, Omair Latif, DO  benzonatate (TESSALON) 100 MG capsule Take 1 capsule (100 mg total) by mouth 3 (three) times daily as needed for cough. 02/25/18  Yes Debbe Odea, MD  dexamethasone (DECADRON) 4 MG tablet Take 1 tablet (4 mg total) by mouth 2 (two) times daily with a meal. 03/04/18  Yes Hayden Pedro, PA-C  enoxaparin (LOVENOX) 60 MG/0.6ML injection Inject 0.6 mLs (60 mg total) into the skin every 12 (twelve) hours. 02/26/18  Yes Sheikh, [place], DO  feeding supplement, ENSURE ENLIVE, (ENSURE ENLIVE) LIQD Take 237 mLs by mouth 2 (two) times daily between meals. 02/26/18  Yes Sheikh, Omair Latif, DO  guaiFENesin (MUCINEX) 600 MG 12 hr tablet Take 2 tablets (1,200 mg total) by mouth 2 (two) times daily. 02/26/18  Yes Sheikh, Omair Latif, DO  Multiple Vitamin (MULTIVITAMIN WITH MINERALS) TABS tablet Take 1 tablet by mouth daily. 02/26/18  Yes Sheikh, Omair Latif, DO  sertraline (ZOLOFT) 50 MG tablet Take 1/2 tab daily for 1 week and  than full tab daily 01/30/18  Yes Arfeen, Arlyce Harman, MD  sodium chloride (OCEAN) 0.65 % SOLN nasal spray Place 1 spray into both nostrils as needed for congestion. 02/26/18  Yes Raiford Noble [place], [place]    Physical Exam: Vitals:   04/07/2018 2214 04/11/2018 2244 04/19/2018 2300 04/01/2018 2330  BP: 102/77 124/68 118/82 111/71  Pulse: (!) 126 (!) 116    Resp: (!) 29 (!) 27 (!) 32 (!) 36  Temp:      TempSrc:      SpO2: (!) 89% 96% 97% 96%  Weight:      Height:          Constitutional:  Moderately built and nourished. Vitals:   03/28/2018 2214 04/15/2018 2244 04/11/2018 2300 04/18/2018 2330  BP: 102/77 124/68 118/82 111/71  Pulse: (!) 126 (!) 116    Resp: (!) 29 (!) 27 (!) 32 (!) 36  Temp:      TempSrc:      SpO2: (!) 89% 96% 97% 96%  Weight:      Height:       Eyes: Anicteric no pallor. ENMT: No discharge from the ears eyes nose or mouth. Neck: No mass felt.  No neck rigidity.  No JVD appreciated. Respiratory: Mild expiratory wheeze and no crepitations. Cardiovascular: S1-S2 heard tachycardic. Abdomen: Soft nontender bowel sounds present. Musculoskeletal: No edema.  No joint effusion. Skin: No rash.  Skin appears warm. Neurologic: Alert awake oriented to time place and person.  Moves all extremities. Psychiatric: Appears normal.  Normal affect.   Labs on Admission: I have personally reviewed following labs and imaging studies  CBC: Recent Labs  Lab 03/19/18 1523 03/26/2018 2035  WBC 4.6 2.7*  NEUTROABS 3.7  --   HGB 8.2* 12.0  HCT 25.4* 36.2  MCV 95.1 90.0  PLT 324 630   Basic Metabolic Panel: Recent Labs  Lab 03/19/18 1523 04/10/2018 2035  NA 139 137  K 4.0 3.3*  CL 100 98*  CO2 28 25  GLUCOSE 97 192*  BUN 13 11  CREATININE 0.51* 0.51  CALCIUM 10.3 9.5   GFR: Estimated Creatinine Clearance: 58 mL/min (by C-G formula based on SCr of 0.51 mg/dL). Liver Function Tests: Recent Labs  Lab 03/19/18 1523  AST 19  ALT 26  ALKPHOS 77  BILITOT 0.3  PROT 7.1  ALBUMIN 2.6*   No results for input(s): LIPASE, AMYLASE in the last 168 hours. No results for input(s): AMMONIA in the last 168 hours. Coagulation Profile: No results for input(s): INR, PROTIME in the last 168 hours. Cardiac Enzymes: No results for input(s): CKTOTAL, CKMB, CKMBINDEX, TROPONINI in the last 168 hours. BNP (last 3 results) No results for input(s): PROBNP in the last 8760 hours. HbA1C: No results for input(s): HGBA1C in the last 72 hours. CBG: No results for input(s): GLUCAP in  the last 168 hours. Lipid Profile: No results for input(s): CHOL, HDL, LDLCALC, TRIG, CHOLHDL, LDLDIRECT in the last 72 hours. Thyroid Function Tests: No results for input(s): TSH, T4TOTAL, FREET4, T3FREE, THYROIDAB in the last 72 hours. Anemia Panel: No results for input(s): VITAMINB12, FOLATE, FERRITIN, TIBC, IRON, RETICCTPCT in the last 72 hours. Urine analysis:    Component Value Date/Time   COLORURINE YELLOW 02/27/2018 0019   APPEARANCEUR HAZY (A) 02/27/2018 0019   LABSPEC 1.025 02/27/2018 0019   PHURINE 5.0 02/27/2018 0019   GLUCOSEU 150 (A) 02/27/2018 0019   HGBUR NEGATIVE 02/27/2018 0019   BILIRUBINUR NEGATIVE 02/27/2018 0019   KETONESUR 5 (A) 02/27/2018 0019  PROTEINUR NEGATIVE 02/27/2018 0019   NITRITE NEGATIVE 02/27/2018 0019   LEUKOCYTESUR NEGATIVE 02/27/2018 0019   Sepsis Labs: @LABRCNTIP (procalcitonin:4,lacticidven:4) )No results found for this or any previous visit (from the past 240 hour(s)).   Radiological Exams on Admission: Dg Chest Port 1 View  Result Date: 04/11/2018 CLINICAL DATA:  Shortness of breath. Hypoxia. History of lung cancer. EXAM: PORTABLE CHEST 1 VIEW COMPARISON:  02/26/2018 FINDINGS: The cardiac silhouette is grossly normal in size. Abnormal density in the right hilar region corresponds to a known mass. There is persistent elevation of the right hemidiaphragm with evidence of increased right lung volume loss. Patchy and coarse opacities are present throughout the right lung and have particularly worsened in the lung base. Coarse opacity is also present in the left lung base in appears increased, while left midlung opacity on the prior study has largely resolved. A small right pleural effusion is not excluded. No pneumothorax is identified. No acute osseous abnormality is seen. IMPRESSION: 1. Right lung volume loss with increasing patchy and coarse opacity throughout much of the right lung concerning for pneumonia. 2. Increasing left basilar infiltrate  with largely resolved infiltrate in the left midlung. Electronically Signed   By: Logan Bores M.D.   On: 04/04/2018 21:03    EKG: Independently reviewed.  Sinus tachycardia.  Assessment/Plan Principal Problem:   Acute respiratory failure with hypoxia (HCC) Active Problems:   Essential hypertension   Squamous cell carcinoma of right lung ([place])   Brain metastasis ([place])   HCAP (healthcare-associated pneumonia)   Acute DVT (deep venous thrombosis) ([place])    1. Acute respiratory failure with hypoxia likely a combination of pneumonia and COPD.  Patient is placed on empiric antibiotics for pneumonia.  Will continue on nebulizer Pulmicort and steroids for COPD.  Will check CT angiogram of the chest for PE. 2. History of recent diagnosis of DVT on Lovenox which will be dosed per pharmacy. 3. History of hypertension presently off medications. 4. History of anemia probably related to cancer chemotherapy.  Follow CBC.  Presently hemoglobin is more than her baseline. 5. Leukopenia likely related to chemotherapy. 6. Stage IV lung cancer being followed by Dr. Julien Nordmann. 7. Hyperglycemia likely from steroids.   DVT prophylaxis: Lovenox. Code Status: DNR. Family Communication: No family at the bedside. Disposition Plan: To be determined. Consults called: Palliative care. Admission status: Inpatient.   Rise Patience MD [facility] Pager [phone].  If 7PM-7AM, please contact night-coverage [email or web address] Password [email or web address]  03/24/2018, 12:07 AM

## 2018-03-25 ENCOUNTER — Inpatient Hospital Stay (HOSPITAL_COMMUNITY): Payer: Medicare HMO

## 2018-03-25 DIAGNOSIS — J9621 Acute and chronic respiratory failure with hypoxia: Secondary | ICD-10-CM

## 2018-03-25 DIAGNOSIS — R0902 Hypoxemia: Secondary | ICD-10-CM

## 2018-03-25 DIAGNOSIS — J9601 Acute respiratory failure with hypoxia: Secondary | ICD-10-CM

## 2018-03-25 DIAGNOSIS — I82492 Acute embolism and thrombosis of other specified deep vein of left lower extremity: Secondary | ICD-10-CM

## 2018-03-25 DIAGNOSIS — R0603 Acute respiratory distress: Secondary | ICD-10-CM

## 2018-03-25 DIAGNOSIS — C3490 Malignant neoplasm of unspecified part of unspecified bronchus or lung: Secondary | ICD-10-CM

## 2018-03-25 DIAGNOSIS — J189 Pneumonia, unspecified organism: Principal | ICD-10-CM

## 2018-03-25 DIAGNOSIS — J441 Chronic obstructive pulmonary disease with (acute) exacerbation: Secondary | ICD-10-CM

## 2018-03-25 LAB — BASIC METABOLIC PANEL
Anion gap: 11 (ref 5–15)
BUN: 13 mg/dL (ref 6–20)
CO2: 26 mmol/L (ref 22–32)
Calcium: 9.4 mg/dL (ref 8.9–10.3)
Chloride: 103 mmol/L (ref 101–111)
Creatinine, Ser: 0.41 mg/dL — ABNORMAL LOW (ref 0.44–1.00)
GFR calc Af Amer: >60 mL/min (ref >60)
GFR calc non Af Amer: >60 mL/min (ref >60)
Glucose, Bld: 135 mg/dL — ABNORMAL HIGH (ref 65–99)
Potassium: 4 mmol/L (ref 3.5–5.1)
Sodium: 140 mmol/L (ref 135–145)

## 2018-03-25 LAB — CBC
HCT: 35.5 % — ABNORMAL LOW (ref 36.0–46.0)
Hemoglobin: 11.6 g/dL — ABNORMAL LOW (ref 12.0–15.0)
MCH: 30.4 pg (ref 26.0–34.0)
MCHC: 32.7 g/dL (ref 30.0–36.0)
MCV: 92.9 fL (ref 78.0–100.0)
Platelets: 288 10*3/uL (ref 150–400)
RBC: 3.82 MIL/uL — ABNORMAL LOW (ref 3.87–5.11)
RDW: 17.8 % — ABNORMAL HIGH (ref 11.5–15.5)
WBC: 4.2 10*3/uL (ref 4.0–10.5)

## 2018-03-25 MED ORDER — LORAZEPAM 2 MG/ML IJ SOLN: 2.0000 mg | INTRAMUSCULAR | Status: DC | PRN

## 2018-03-25 MED ORDER — MORPHINE SULFATE (PF) 2 MG/ML IV SOLN
INTRAVENOUS | Status: AC
  Filled 2018-03-25: qty 1

## 2018-03-25 MED ORDER — LORAZEPAM 2 MG/ML IJ SOLN
INTRAMUSCULAR | Status: AC
  Filled 2018-03-25: qty 1

## 2018-03-25 MED ORDER — MORPHINE SULFATE (PF) 2 MG/ML IV SOLN
1.0000 mg | INTRAVENOUS | Status: AC
  Administered 2018-03-25: 2 mg via INTRAVENOUS
  Filled 2018-03-25: qty 1

## 2018-03-25 MED ORDER — FUROSEMIDE 10 MG/ML IJ SOLN: 60.0000 mg | Freq: Once | INTRAMUSCULAR | Status: DC

## 2018-03-25 MED ORDER — LORAZEPAM 2 MG/ML IJ SOLN
0.5000 mg | Freq: Once | INTRAMUSCULAR | Status: AC
Start: 2018-03-25 — End: 2018-03-25
  Administered 2018-03-25: 2 mg via INTRAVENOUS

## 2018-03-25 MED ORDER — LORAZEPAM 2 MG/ML IJ SOLN
INTRAMUSCULAR | Status: AC
  Administered 2018-03-25: 2 mg via INTRAVENOUS
  Filled 2018-03-25: qty 1

## 2018-03-25 MED ORDER — FUROSEMIDE 10 MG/ML IJ SOLN
40.0000 mg | Freq: Once | INTRAMUSCULAR | Status: AC
  Administered 2018-03-25: 40 mg via INTRAVENOUS
  Filled 2018-03-25: qty 4

## 2018-03-25 MED ORDER — HYDROCOD POLST-CPM POLST ER 10-8 MG/5ML PO SUER
5.0000 mL | Freq: Once | ORAL | Status: AC
  Administered 2018-03-25: 5 mL via ORAL
  Filled 2018-03-25: qty 5

## 2018-03-25 MED ORDER — LORAZEPAM 2 MG/ML IJ SOLN
0.5000 mg | Freq: Once | INTRAMUSCULAR | Status: AC
  Administered 2018-03-25: 0.5 mg via INTRAVENOUS

## 2018-03-25 MED ORDER — MORPHINE SULFATE (PF) 2 MG/ML IV SOLN
2.0000 mg | Freq: Once | INTRAVENOUS | Status: AC
  Administered 2018-03-25: 2 mg via INTRAVENOUS

## 2018-03-25 MED ORDER — SCOPOLAMINE 1 MG/3DAYS TD PT72
1.0000 | MEDICATED_PATCH | TRANSDERMAL | Status: DC
  Filled 2018-03-25: qty 1

## 2018-03-25 MED ORDER — MORPHINE SULFATE (PF) 2 MG/ML IV SOLN: 2.0000 mg | INTRAVENOUS | Status: DC | PRN

## 2018-03-26 ENCOUNTER — Inpatient Hospital Stay: Payer: Medicare HMO

## 2018-03-28 LAB — CULTURE, BLOOD (ROUTINE X 2)
Culture: NO GROWTH
Culture: NO GROWTH
SPECIAL REQUESTS: ADEQUATE
SPECIAL REQUESTS: ADEQUATE

## 2018-04-02 ENCOUNTER — Ambulatory Visit: Payer: Medicare HMO | Admitting: Internal Medicine

## 2018-04-02 ENCOUNTER — Ambulatory Visit: Payer: Medicare HMO

## 2018-04-02 ENCOUNTER — Encounter: Payer: Medicare HMO | Admitting: Nutrition

## 2018-04-02 ENCOUNTER — Other Ambulatory Visit: Payer: Medicare HMO

## 2018-04-03 ENCOUNTER — Ambulatory Visit: Payer: Self-pay | Admitting: Family Medicine

## 2018-04-09 ENCOUNTER — Other Ambulatory Visit: Payer: Self-pay

## 2018-04-16 ENCOUNTER — Other Ambulatory Visit: Payer: Self-pay

## 2018-04-17 ENCOUNTER — Other Ambulatory Visit: Payer: Self-pay

## 2018-04-21 ENCOUNTER — Ambulatory Visit: Payer: Self-pay | Admitting: Radiation Oncology

## 2018-04-21 NOTE — Progress Notes (Signed)
Shift event note:  Notified by RN on several occasions regarding pt's 02 sats dropping into mid to low 80's intermittently on HCNC. Pt w/ increased WOB and very anxious. Some minimal improvement w/ nebs, IV Ativan and Morphine but only briefly. BP's have been very labile. Currently soft at 95/76. Pt remains tachycardic 120-140's. At bedside pt noted lying in bed w/ moderately increased WOB. She awakens easily and is oriented to person and will follow commands. 02 sats currently low 80's on HFNC and NRB. BBS diminished w/ occasional inspiratory wheezes w/ an occasional dry cough w/ deep inspiration. Pt placed on BiPAP. Within 10-15 min 02 sats improved to 92-96%. Observed at bedside 20-30 min. After a small dose of IV Ativan and eventually small dose of IV morphine pt's WOB has improved and 02 sats in mid to high 90's.  Assessment/Plan: 1. Acute hypoxic respiratory failure: In setting of 67 y/o pt w/ Stage IV lung cancer, COPD and likely HCAP. Continue Zosyn.  Albuterol changed to Xopenex given persistent tachycardia. Continue Pulmicort and Brovana nebs. Tussionex given for dry cough. Continue BiPAP until am. Small doses of IV Ativan for agitation. Re-assess in am for ability to wean off BiPAP. Pt is DNR so no escalation of care. Consider goals of care meeting. Will continue to monitor closely on SDU.    Jeryl Columbia, NP-C [facility] Pager [phone]  CRITICAL CARE Performed by: Jeryl Columbia   Total critical care time: 60 minutes  Critical care time was exclusive of separately billable procedures and treating other patients.  Critical care was necessary to treat or prevent imminent or life-threatening deterioration.  Critical care was time spent personally by me on the following activities: development of treatment plan with patient and/or surrogate as well as nursing, discussions with consultants, evaluation of patient's response to treatment, examination of patient, obtaining  history from patient or surrogate, ordering and performing treatments and interventions, ordering and review of laboratory studies, ordering and review of radiographic studies, pulse oximetry and re-evaluation of patient's condition.

## 2018-04-21 NOTE — Progress Notes (Signed)
Pt unable to maintain sats >85%. Paged on call. ? Bipap?

## 2018-04-21 NOTE — Progress Notes (Addendum)
[name] (sister) @ [phone] notified of patients overnight progress with allowance for all questions to be asked and answered. Sister reports she is on her way.

## 2018-04-21 NOTE — Progress Notes (Signed)
   04-09-18 0900  Clinical Encounter Type  Visited With Family  Visit Type Initial;Psychological support;Spiritual support;Critical Care;Death  Referral From Nurse  Consult/Referral To Chaplain  Spiritual Encounters  Spiritual Needs Emotional;Grief support;Other (Comment) (Spiritual Care Conversation/Support)  Stress Factors  Patient Stress Factors Not reviewed  Family Stress Factors Loss   I visited with the patient's sister who was present at the bedside. I provided grief support for the patient's sister. She asked that I stay on the unit and provide support for the patient's son who is on his way to the hospital.   Chaplain Shanon Ace M.Div., [facility]

## 2018-04-21 NOTE — Progress Notes (Signed)
Sister at bedside; Dr [place] Crane called. Patients sister requested BiPap to be removed to allow the patient to be comfortable. BiPap Removed.

## 2018-04-21 NOTE — Death Summary Note (Addendum)
Death Summary  Beth Alexander YTK:[id number] DOB: 1950-11-18 DOA: 04/21/2018  PCP: [name], MD  Admit date: 04/21/18 Date of Death: 04/23/18 Time of Death: 0920 Notification: [name], MD notified of death of 23-Apr-2018   History of present illness:  Beth Alexander is a 67 y.o. female with history of stage IV lung cancer, DVT, hypertension off medications, COPD presents to the ER because of worsening shortness of breath over the last 2 days with some productive cough and wheezing.  Denies any chest pain.  Denies nausea vomiting or abdominal pain.  ED Course: In the ER patient was hypoxic requiring almost 100% oxygen.  Patient is able to talk sentences completely.  X-ray shows bilateral infiltrates concerning for pneumonia.  Patient also has wheezing for which patient was placed on nebulizer treatment along with steroids.  Started on empiric antibiotics IV vancomycin and IV zosyn.  Admitted for acute hypoxic respiratory failure likely from pneumonia, HCAP, Acute COPD exacerbation, and possible worsening cancer. Self reported chronic hypoxia on 4L O2 [place] at home continuously.  Worsening hypoxia requiring non invasive positive airway pressure BIPAP. Niece made patient CMO on 04-23-2018. BIPAP removed with niece present in the room. Patient expired shortly after. Time of death 41.  Final Diagnoses:  1.  Cardiac arrest 2/2 to respiratory failure.  2.  Respiratory failure due to Stage IV lung cancer complicated by HCAP and pulmonary edema.  3.  Chronic hypoxia on 4L O2 continuously at home.   The results of significant diagnostics from this hospitalization (including imaging, microbiology, ancillary and laboratory) are listed below for reference.    Significant Diagnostic Studies: Dg Chest 2 View  Result Date: 02/26/2018 CLINICAL DATA:  Sepsis, pneumonia, fatigue. EXAM: CHEST - 2 VIEW COMPARISON:  02/19/2018. FINDINGS: Stable exam, with BILATERAL pulmonary opacities, and RIGHT hilar mass.  RIGHT hemidiaphragm elevation. No significant effusion or pneumothorax. Unchanged cardiac silhouette. No acute osseous findings. IMPRESSION: Stable chest. Electronically Signed   By: Staci Righter M.D.   On: 02/26/2018 23:08   Ct Angio Chest Pe W Or Wo Contrast  Result Date: 03/24/2018 CLINICAL DATA:  High pretest probability for pulmonary embolus. Stage IV lung cancer. History of DVT. EXAM: CT ANGIOGRAPHY CHEST WITH CONTRAST TECHNIQUE: Multidetector CT imaging of the chest was performed using the standard protocol during bolus administration of intravenous contrast. Multiplanar CT image reconstructions and MIPs were obtained to evaluate the vascular anatomy. CONTRAST:  12mL ISOVUE-370 IOPAMIDOL (ISOVUE-370) INJECTION 76% COMPARISON:  02/14/2018. FINDINGS: Cardiovascular: Mild cardiac enlargement. Aortic atherosclerosis. Calcification in the LAD and left circumflex and RCA coronary arteries. The main pulmonary artery is patent. No central obstructing embolus. No lobar or segmental pulmonary artery filling defects identified. 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 Previously 1.7 cm. The right hilar mass measures 3.7 x 2.3 cm, image 40/4. Previously 3.5 x 1.7 cm. Lungs/Pleura: Moderate to advanced changes of emphysema again noted. Loculated pleural fluid overlying the right lung is identified and extending along the fissures. New from previous exam. Previous right lower posterior pleural lesion with chest wall involvement measures 1.4 by 2.5 cm, image 59/4. Previously 1.6 by 3.4 cm. There has been significant worsening aeration of the much of the right lung with progressive changes of fibrosis and airspace consolidation with sparing of the right lung apex. There is diffuse interstitial reticulation throughout the left lung, similar to previous  exam. Upper Abdomen: Hepatic steatosis is identified. Metastatic lesion to the upper pole of right kidney is again noted. Similar appearance of nodularity of the right adrenal gland which is compatible with metastatic disease. Musculoskeletal: No chest wall abnormality. No acute or significant osseous findings. Review of the MIP images confirms the above findings. IMPRESSION: 1. There is no evidence for acute pulmonary embolus. 2. There has been mild increase in size of right paratracheal adenopathy and right perihilar lung mass. The right lower, posterior pleural lesion with chest wall involvement appears decreased in size in the interval. 3. New small loculated pleural effusion overlies the right lung. Additionally, there has been significant worsening air aeration to the right lung with progressive changes of external beam radiation including multiple areas of consolidation and fibrosis. 4. Similar appearance of diffuse interstitial reticulation superimposed upon emphysema throughout the left lung. 5. Metastatic disease to the right kidney and right adrenal gland, similar to previous exam. Electronically Signed   By: Kerby Moors M.D.   On: 03/24/2018 10:28   Dg Chest Port 1 View  Result Date: 03-28-2018 CLINICAL DATA:  Shortness of breath. EXAM: PORTABLE CHEST 1 VIEW COMPARISON:  Radiograph of March 23, 2018. FINDINGS: Stable cardiomediastinal silhouette. Significantly increased diffuse interstitial densities are noted concerning for worsening edema or pneumonia. No pneumothorax is noted. No significant pleural effusion is noted. Bony thorax is unremarkable. IMPRESSION: Significantly increased bilateral diffuse lung densities are noted concerning for worsening edema or pneumonia. Electronically Signed   By: Marijo Conception, M.D.   On: 03-28-18 07:33   Dg Chest Port 1 View  Result Date: 03/29/2018 CLINICAL DATA:  Shortness of breath. Hypoxia. History of lung cancer. EXAM: PORTABLE CHEST 1 VIEW COMPARISON:   02/26/2018 FINDINGS: The cardiac silhouette is grossly normal in size. Abnormal density in the right hilar region corresponds to a known mass. There is persistent elevation of the right hemidiaphragm with evidence of increased right lung volume loss. Patchy and coarse opacities are present throughout the right lung and have particularly worsened in the lung base. Coarse opacity is also present in the left lung base in appears increased, while left midlung opacity on the prior study has largely resolved. A small right pleural effusion is not excluded. No pneumothorax is identified. No acute osseous abnormality is seen. IMPRESSION: 1. Right lung volume loss with increasing patchy and coarse opacity throughout much of the right lung concerning for pneumonia. 2. Increasing left basilar infiltrate with largely resolved infiltrate in the left midlung. Electronically Signed   By: Logan Bores M.D.   On: 04/14/2018 21:03    Microbiology: Recent Results (from the past 240 hour(s))  Blood Culture (routine x 2)     Status: None (Preliminary result)   Collection Time: 04/06/2018  9:31 PM  Result Value Ref Range Status   Specimen Description   Final    BLOOD LEFT ANTECUBITAL Performed at [facility], [place] [address], [place], [place] [address]    Special Requests   Final    BOTTLES DRAWN AEROBIC AND ANAEROBIC Blood Culture adequate volume Performed at [facility] [address] [place], [place] [address]    Culture   Final    NO GROWTH < 24 HOURS Performed at [facility] [address], [place],  [address]    Report Status PENDING  Incomplete  Blood Culture (routine x 2)     Status: None (Preliminary result)   Collection Time: 03/27/2018  9:31 PM  Result Value Ref Range Status   Specimen Description   Final    BLOOD LEFT FOREARM Performed at [facility] [address], [place], [place] [address]    Special Requests   Final     BOTTLES DRAWN AEROBIC AND ANAEROBIC Blood Culture adequate volume Performed at [facility] [address] [place], [place] [address]    Culture   Final    NO GROWTH < 24 HOURS Performed at [facility] [address], [place], [place] [address]    Report Status PENDING  Incomplete  MRSA PCR Screening     Status: None   Collection Time: 03/24/18  2:09 AM  Result Value Ref Range Status   MRSA by PCR NEGATIVE NEGATIVE Final    Comment:        The GeneXpert MRSA Assay (FDA approved for NASAL specimens only), is one component of a comprehensive MRSA colonization surveillance program. It is not intended to diagnose MRSA infection nor to guide or monitor treatment for MRSA infections. Performed at [facility], [place] [address] [place], [place] [address]      Labs: Basic Metabolic Panel: Recent Labs  Lab 03/19/18 1523 04/18/2018 2035 03/24/18 0348 2018/04/15 0640  NA 139 137 136 140  K 4.0 3.3* 3.7 4.0  CL 100 98* 103 103  CO2 28 25 21* 26  GLUCOSE 97 192* 196* 135*  BUN 13 11 8 13   CREATININE 0.51* 0.51 0.40* 0.41*  CALCIUM 10.3 9.5 8.9 9.4   Liver Function Tests: Recent Labs  Lab 03/19/18 1523  AST 19  ALT 26  ALKPHOS 77  BILITOT 0.3  PROT 7.1  ALBUMIN 2.6*   No results for input(s): LIPASE, AMYLASE in the last 168 hours. No results for input(s): AMMONIA in the last 168 hours. CBC: Recent Labs  Lab 03/19/18 1523 04/08/2018 2035 03/24/18 0348 2018-04-15 0640  WBC 4.6 2.7* 2.6* 4.2  NEUTROABS 3.7  --   --   --   HGB 8.2* 12.0 11.2* 11.6*  HCT 25.4* 36.2 34.4* 35.5*  MCV 95.1 90.0 90.1 92.9  PLT 324 314 317 288   Cardiac Enzymes: No results for input(s): CKTOTAL, CKMB, CKMBINDEX, TROPONINI in the last 168 hours. D-Dimer No results for input(s): DDIMER in the last 72 hours. BNP: Invalid input(s): POCBNP CBG: No results for input(s): GLUCAP in the last 168 hours. Anemia work up No results for input(s):  VITAMINB12, FOLATE, FERRITIN, TIBC, IRON, RETICCTPCT in the last 72 hours. Urinalysis    Component Value Date/Time   COLORURINE YELLOW 02/27/2018 0019   APPEARANCEUR HAZY (A) 02/27/2018 0019   LABSPEC 1.025 02/27/2018 0019   PHURINE 5.0 02/27/2018 0019   GLUCOSEU 150 (A) 02/27/2018 0019   HGBUR NEGATIVE 02/27/2018 0019   BILIRUBINUR NEGATIVE 02/27/2018 0019   KETONESUR 5 (A) 02/27/2018 0019   PROTEINUR NEGATIVE 02/27/2018 0019   NITRITE NEGATIVE 02/27/2018 0019   LEUKOCYTESUR NEGATIVE 02/27/2018 0019   Sepsis Labs Invalid input(s): PROCALCITONIN,  WBC,  LACTICIDVEN     SIGNED:  Kayleen Memos, MD  [facility] Apr 15, 2018, 10:43 AM Pager   If 7PM-7AM, please contact night-coverage [email or web address] Password [email or web address]

## 2018-04-21 NOTE — Progress Notes (Signed)
On call provider at bedside to eval patient.

## 2018-04-21 NOTE — Progress Notes (Signed)
CSW inquired about family needs post patient death. There are no needs at this time.

## 2018-04-21 NOTE — Care Management Note (Signed)
Case Management Note  Patient Details  Name: Beth Alexander MRN: [id number] Date of Birth: 1951-06-18  Subjective/Objective:                  Expired this am  Action/Plan: No cm needs  Expected Discharge Date:  Mar 28, 2018               Expected Discharge Plan:  Expired  In-House Referral:     Discharge planning Services  CM Consult  Post Acute Care Choice:    Choice offered to:     DME Arranged:    DME Agency:     HH Arranged:    [place] Agency:     Status of Service:  Completed, signed off  If discussed at [employer] of Stay Meetings, dates discussed:    Additional Comments:  Leeroy Cha, RN 03/28/18, 12:08 PM

## 2018-04-21 DEATH — deceased

## 2018-04-23 ENCOUNTER — Other Ambulatory Visit: Payer: Self-pay

## 2018-04-23 ENCOUNTER — Ambulatory Visit: Payer: Self-pay

## 2018-04-23 ENCOUNTER — Ambulatory Visit: Payer: Self-pay | Admitting: Nurse Practitioner

## 2018-04-30 ENCOUNTER — Other Ambulatory Visit: Payer: Self-pay

## 2018-05-07 ENCOUNTER — Other Ambulatory Visit: Payer: Self-pay

## 2018-05-14 ENCOUNTER — Ambulatory Visit: Payer: Self-pay

## 2018-05-14 ENCOUNTER — Ambulatory Visit: Payer: Self-pay | Admitting: Oncology

## 2018-05-14 ENCOUNTER — Other Ambulatory Visit: Payer: Self-pay

## 2018-05-21 ENCOUNTER — Other Ambulatory Visit: Payer: Self-pay

## 2019-05-30 IMAGING — DX DG CHEST 2V
2 series · 2 of 2 positions shown · non-contrast
Comparison: None.

CLINICAL DATA: Cough and right-sided chest pain for 1 week.

EXAM:
CHEST  2 VIEW

[w chest lat]
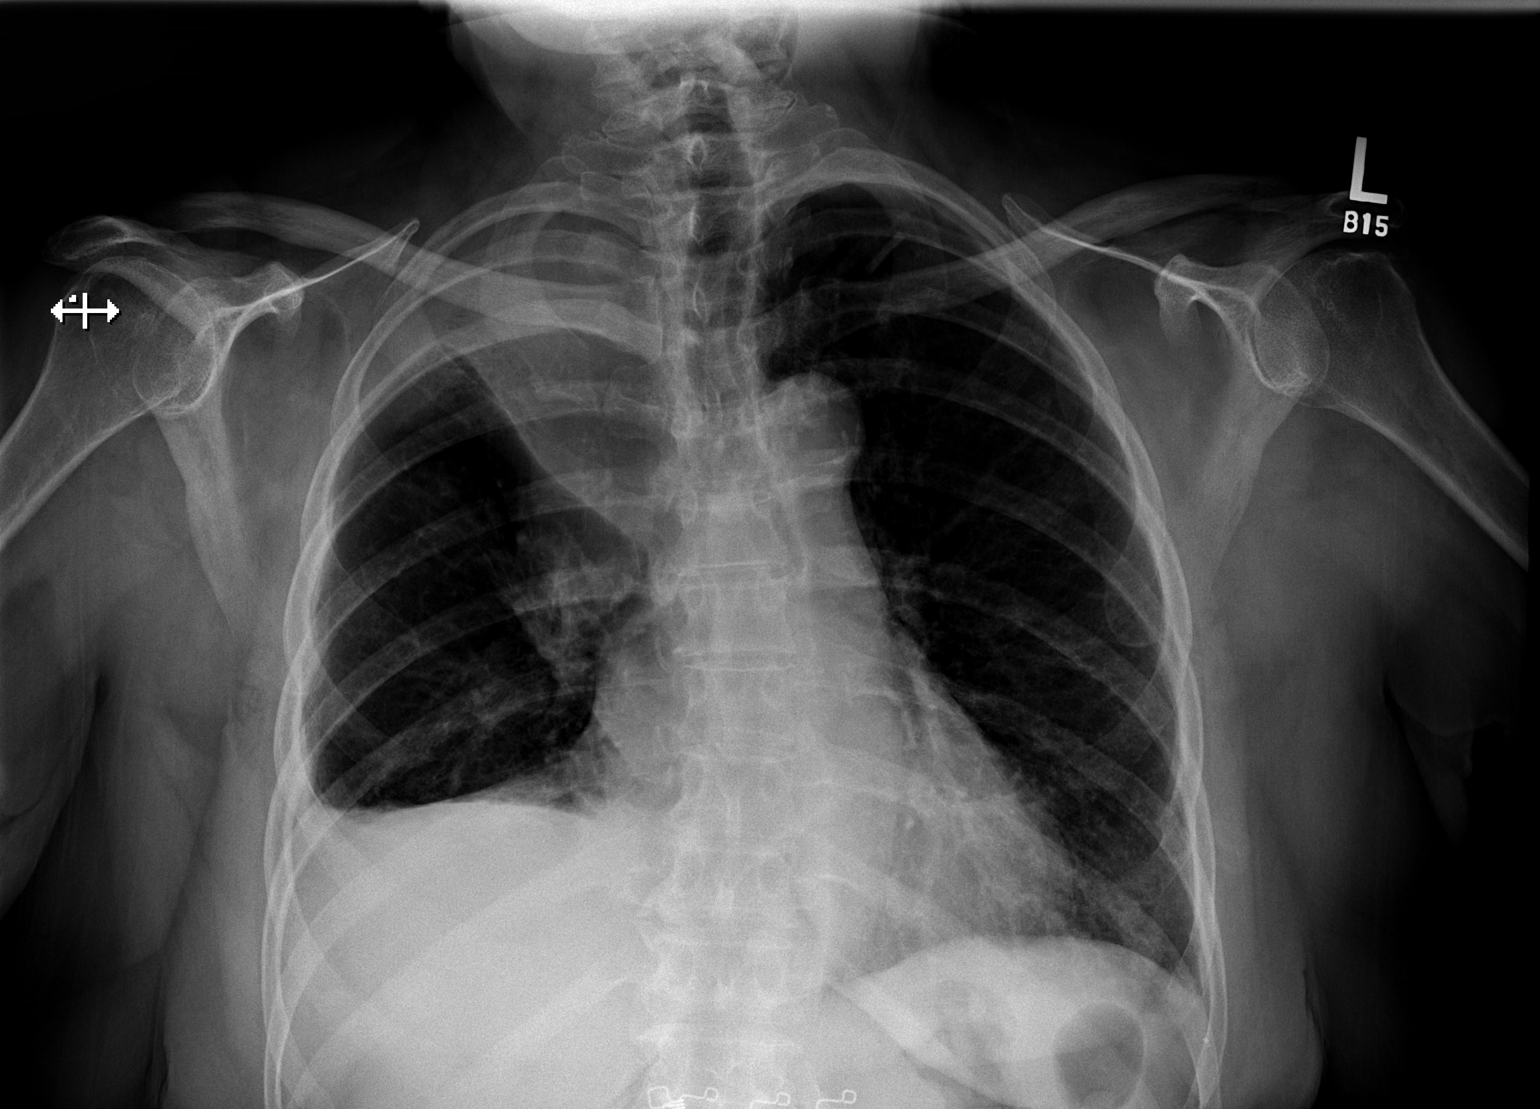

[w chest pa]
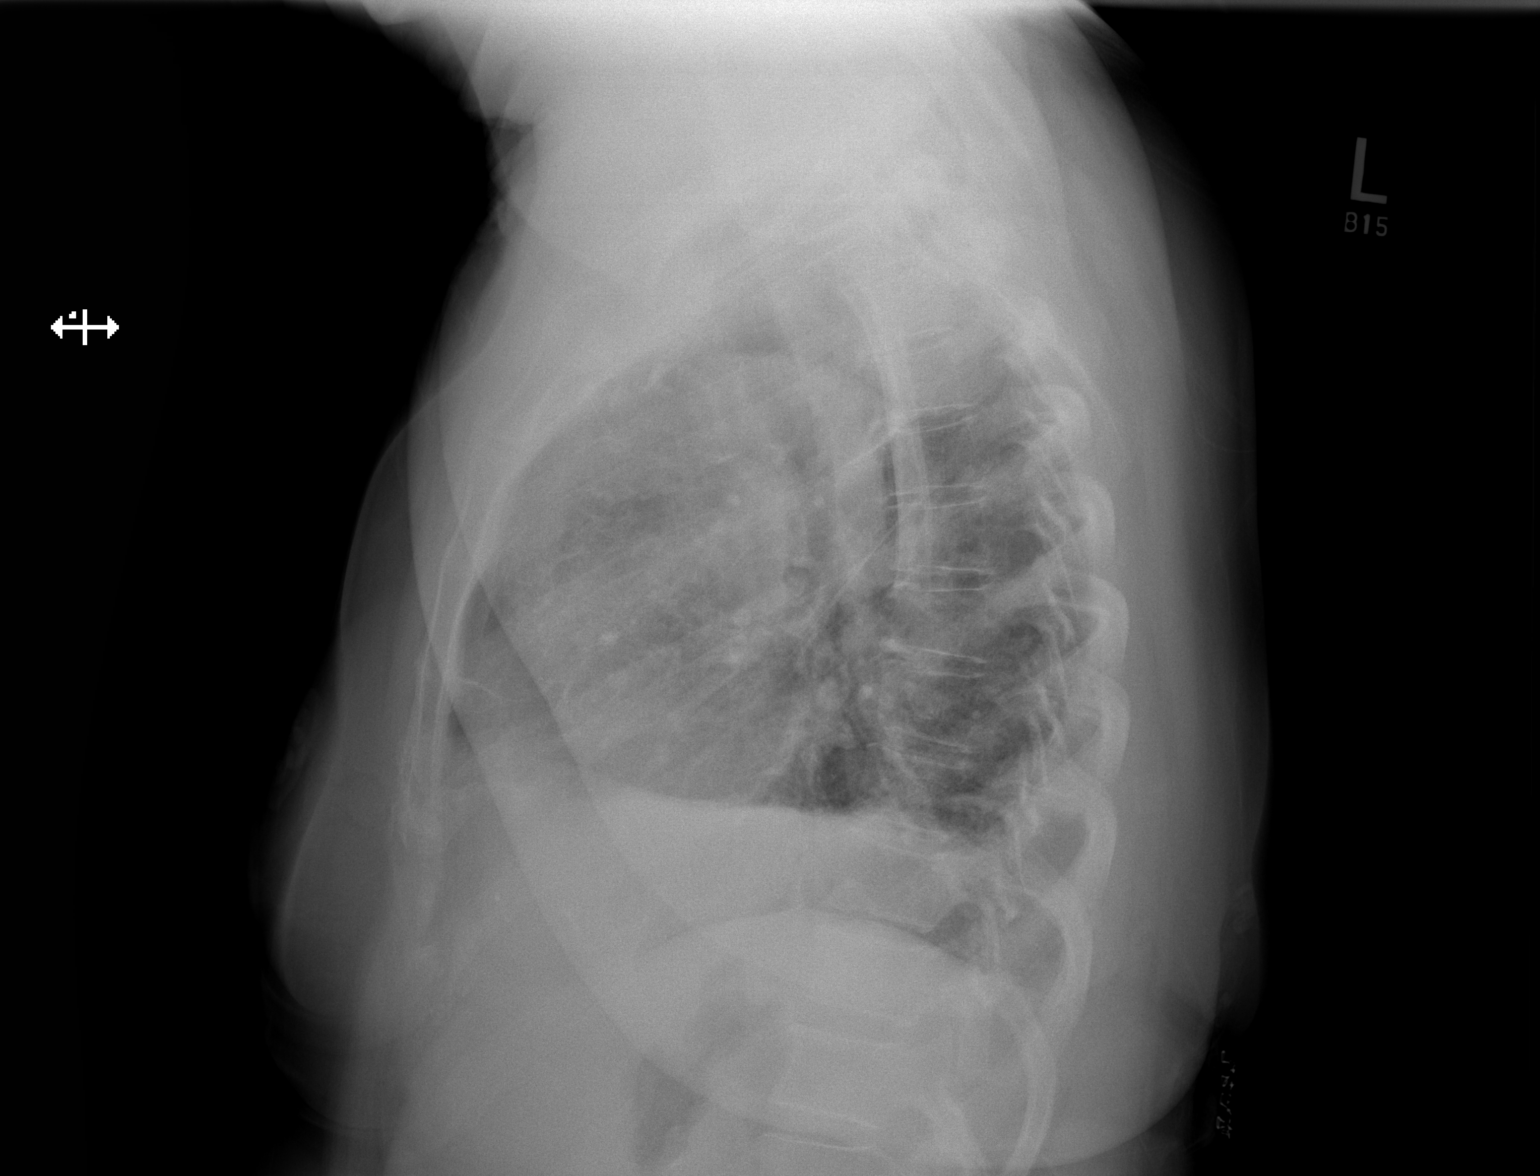

[2 of 2 positions shown; findings below may reference images not displayed]

FINDINGS: The heart size is normal. There is mild perihilar peribronchial
thickening. There is right-sided volume loss secondary to right
upper lobe collapse. The findings are suspicious for right upper
lobe/hilar lesion. Small right pleural effusion. Left lung is
essentially clear. No pulmonary edema.
IMPRESSION: Right upper lobe collapse. Recommend further evaluation CT of the
chest. Intravenous contrast is recommended unless contraindicated.

## 2019-05-31 IMAGING — CT CT CHEST W/ CM
2 of 3 series · 15 of 36 positions shown, 18 images · IV contrast (APPLIED)
Comparison: Chest radiograph 12/09/2017

CLINICAL DATA: Chest pain or shortness of breath. Right upper lobe
collapse.

EXAM:
CT CHEST WITH CONTRAST
TECHNIQUE: Multidetector CT imaging of the chest was performed during
intravenous contrast administration.
CONTRAST:  75mL 1Y8MGC-ODD IOPAMIDOL (1Y8MGC-ODD) INJECTION 61%

[Series 3: thorax 2.0 i31f 2 · axial · 0.85mm/px · z∈[+1099,+1313]mm · 12 of 127 slices shown, 15 images]
[im 10/127  mediastinal]
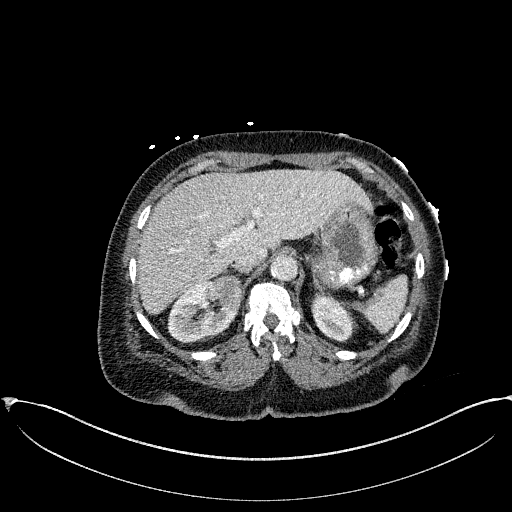
[im 10/127  lung]
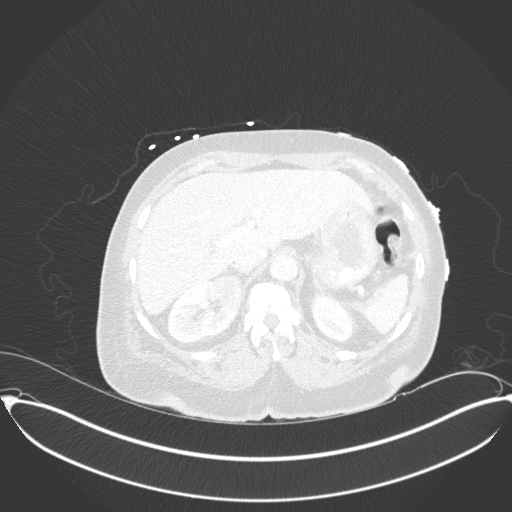
[im 19/127  lung]
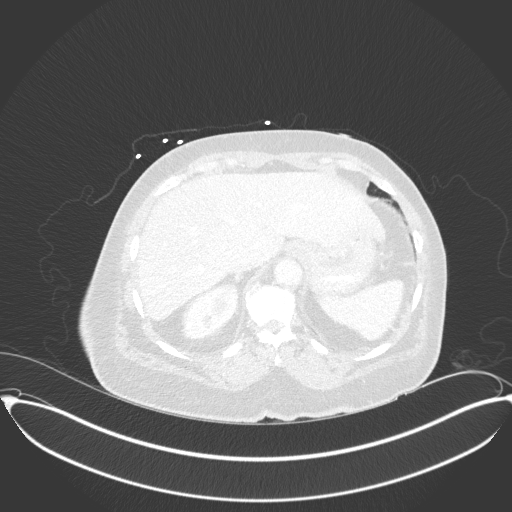
[im 29/127  lung]
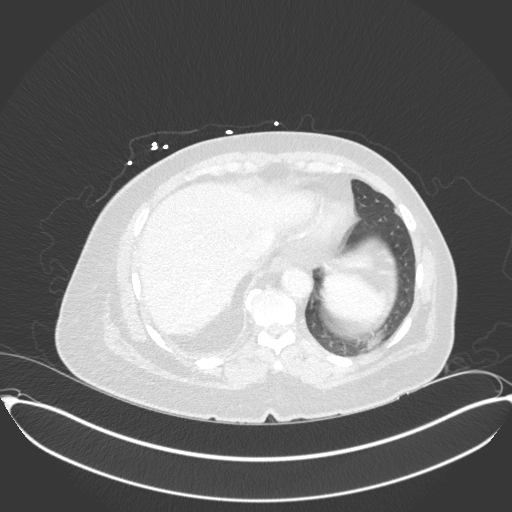
[im 38/127  lung]
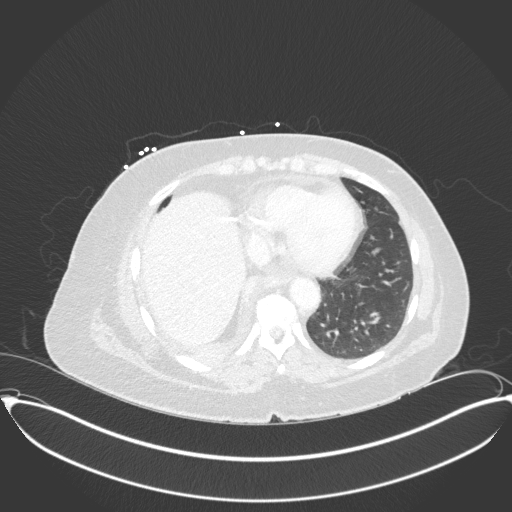
[im 47/127  mediastinal]
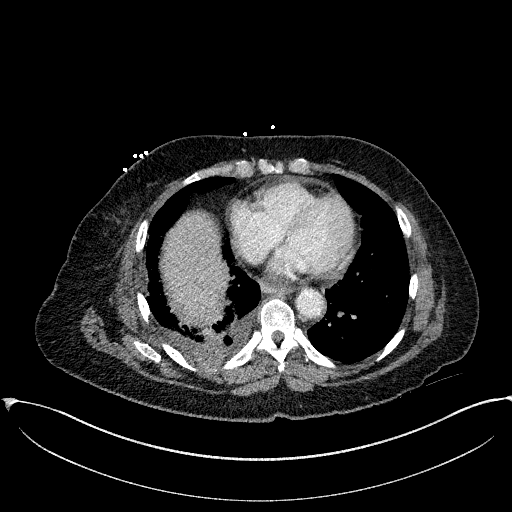
[im 47/127  lung]
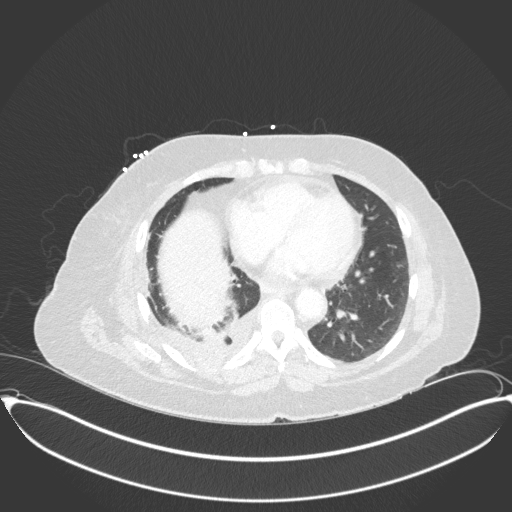
[im 57/127  lung]
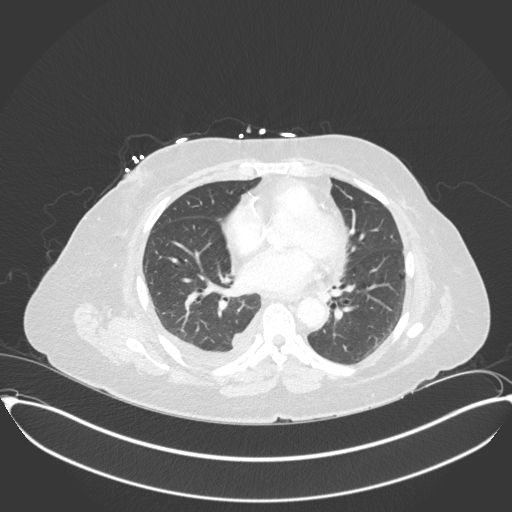
[im 71/127  lung]
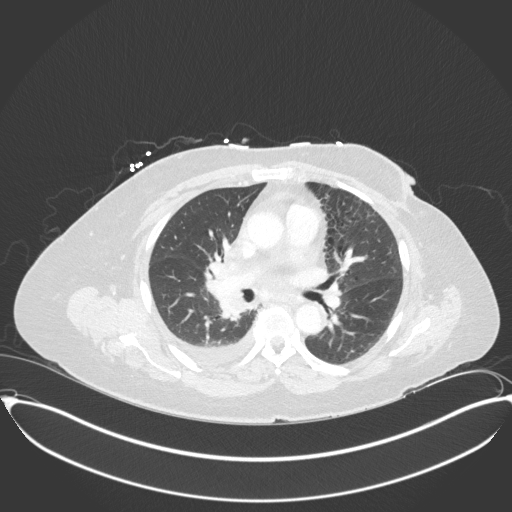
[im 80/127  lung]
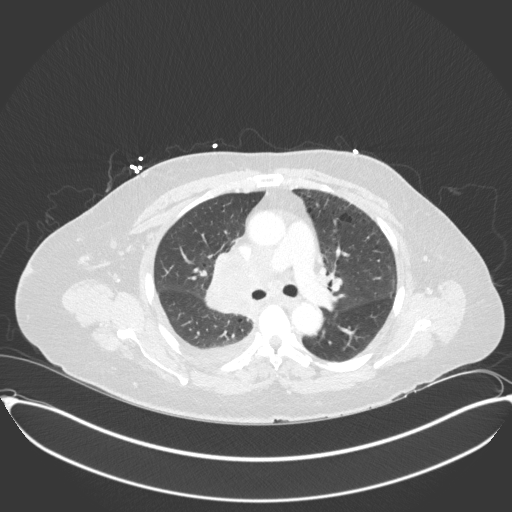
[im 89/127  mediastinal]
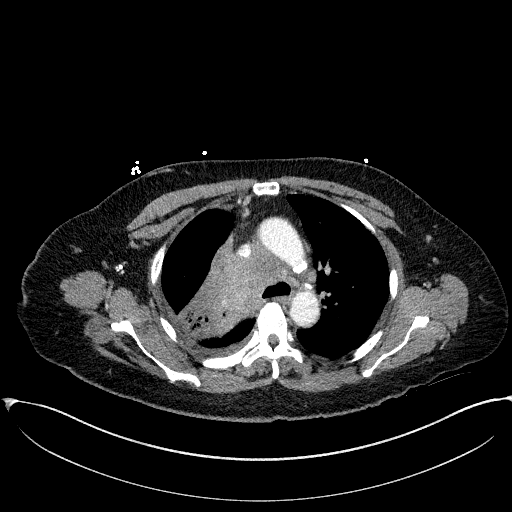
[im 89/127  lung]
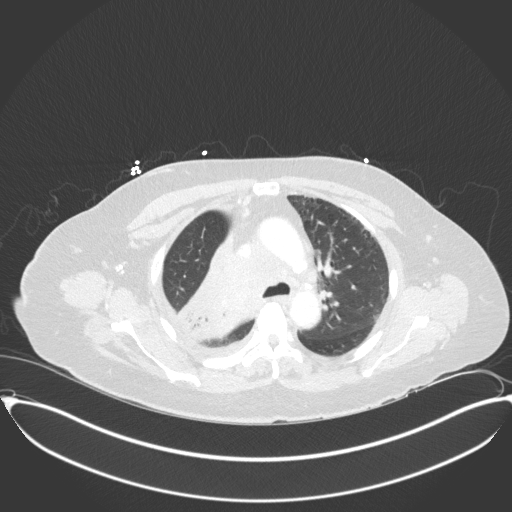
[im 99/127  lung]
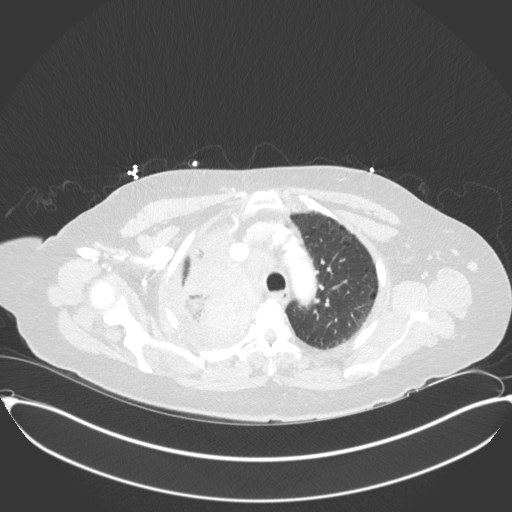
[im 108/127  lung]
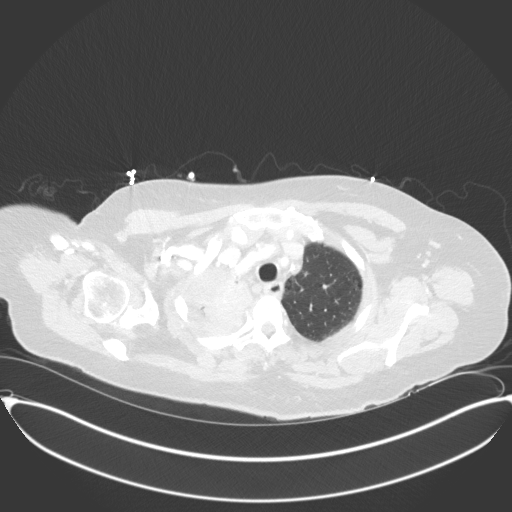
[im 117/127  lung]
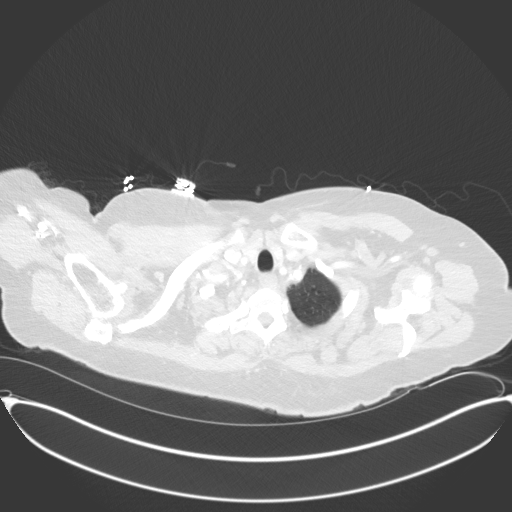

[Series 6: coronal · coronal · 0.50mm/px · 3 of 117 slices shown]
[im 24/117  lung]
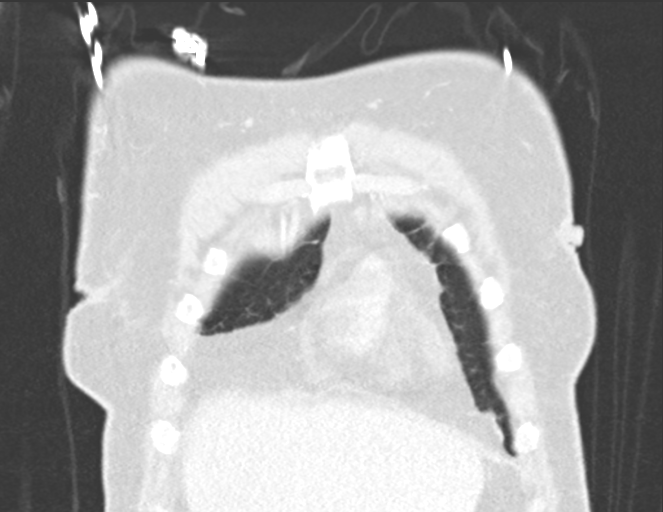
[im 47/117  lung]
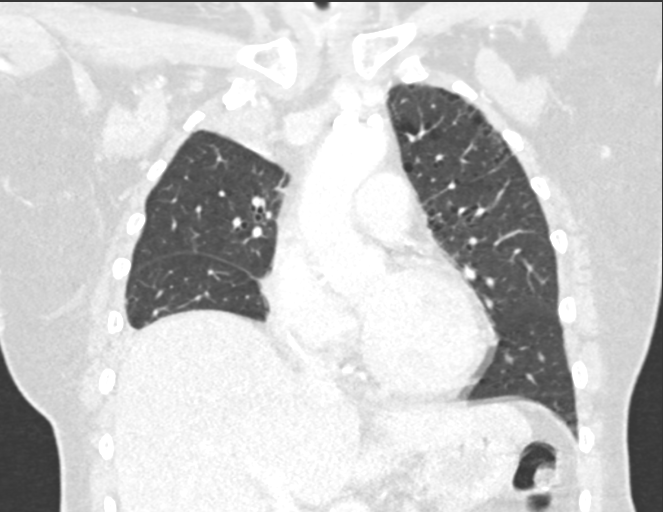
[im 70/117  lung]
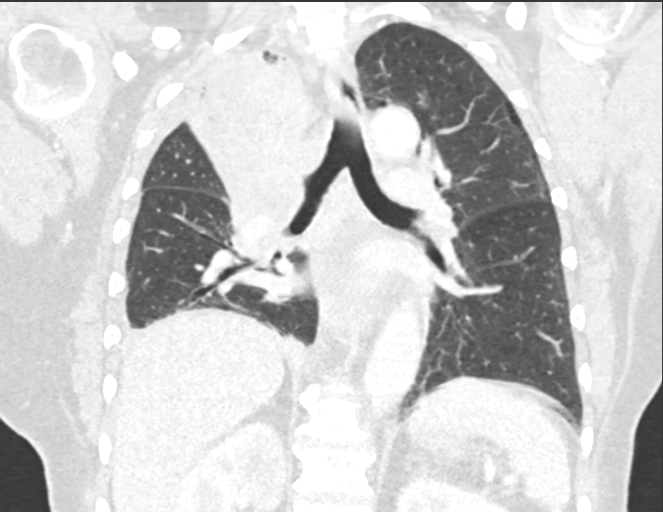

[15 of 36 positions shown; findings below may reference images not displayed]

FINDINGS: Cardiovascular: Critical stenosis of proximal left subclavian
artery. Coronary artery calcification. Small pericardial effusion.
Normal heart size. Calcific atherosclerosis of the aorta.

Mediastinum/Nodes: Massive pretracheal lymphadenopathy with
associated narrowing of the superior vena cava. Right hilar mass
encases the right pulmonary artery, with occlusion of the right
upper lobe artery.. The mass measures approximately 5.5 x 5.1 cm.

Lungs/Pleura: There is complete right upper lobe collapse due to
occlusion of the right upper lobe bronchus by a right hilar mass.
Right middle lobe is clear. There is a lobulated right basilar
pleural effusion. The left lung is clear suspected 2.5 cm mass of
the right posterior pleura..

Upper Abdomen: Hypoattenuation of the anterior left kidney upper
pole, possibly artifactual. Otherwise normal visualized upper
abdominal organs.

Musculoskeletal: No acute osseous abnormality. No lytic or blastic
lesions.
IMPRESSION: 1. Large right hilar mass encasing the right pulmonary artery and
occluding the right upper lobe bronchus with associated complete
right upper lobe collapse. The right upper lobe pulmonary artery
also likely occluded. This is most consistent with a primary lung
carcinoma.
2. General lack of enhancement of the right upper lobe parenchyma
may be due to necrosis or vascular compromise. Necrotic tumor within
atelectatic lung is also a possibility.
3. Right pleural effusion with area of rounded hyperattenuation
along the posterior pleura concerning for a pleural based mass.
4. Narrowing of the superior vena cava secondary to mass effect from
right hilar mass and confluent mediastinal adenopathy.
5. Small pericardial effusion. Aortic atherosclerosis (5PM8L-2FU.U)
and near complete stenosis of the proximal left subclavian artery.

## 2019-06-01 IMAGING — DX DG CHEST 1V PORT
1 series · 1 of 1 positions shown · non-contrast
Comparison: Chest CT 12/10/2017 and earlier.

CLINICAL DATA: 66-year-old female status post bronchoscopic biopsy.

EXAM:
PORTABLE CHEST 1 VIEW

[chest ap]
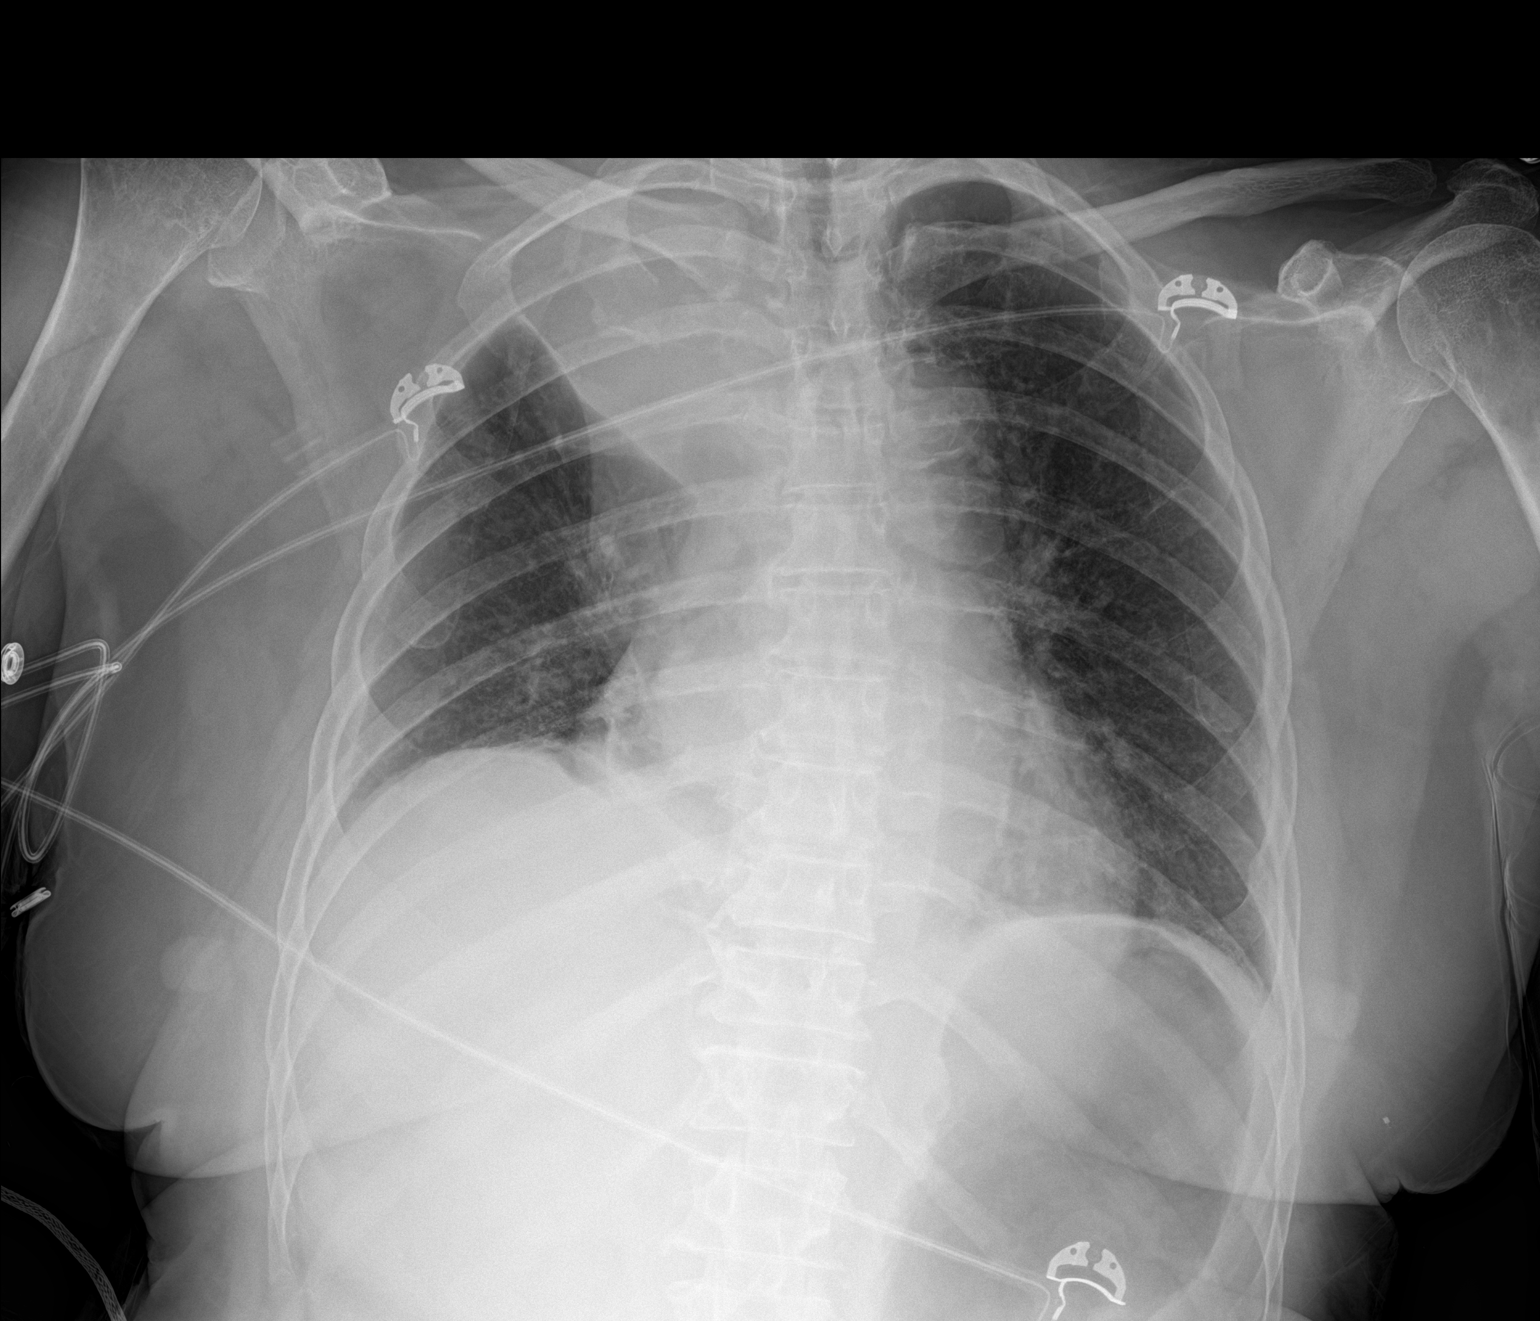

[1 of 1 positions shown; findings below may reference images not displayed]

FINDINGS: Portable AP semi upright view at 7134 hrs. Stable lung volumes.
Unchanged masslike opacity throughout the right upper lung,
inseparable from the right hilum. No pneumothorax. Trace right
pleural effusion appears stable. Stable cardiac size and mediastinal
contours. No new pulmonary opacity. Mildly to moderately increased
gaseous distension of the stomach.
IMPRESSION: 1. No adverse features status post bronchoscopic biopsy.
2. No new cardiopulmonary abnormality.
3. Mild to moderate new gaseous distension of the stomach.
